# Patient Record
Sex: Female | Born: 1965 | Race: White | Hispanic: No | Smoking: Never smoker
Health system: Southern US, Community
[De-identification: ages and names within clinical notes are randomized; demographics above are authoritative.]

## PROBLEM LIST (undated history)

## (undated) DIAGNOSIS — F32A Depression, unspecified: Secondary | ICD-10-CM

## (undated) DIAGNOSIS — F332 Major depressive disorder, recurrent severe without psychotic features: Secondary | ICD-10-CM

## (undated) DIAGNOSIS — K760 Fatty (change of) liver, not elsewhere classified: Secondary | ICD-10-CM

## (undated) DIAGNOSIS — E063 Autoimmune thyroiditis: Secondary | ICD-10-CM

## (undated) DIAGNOSIS — F419 Anxiety disorder, unspecified: Secondary | ICD-10-CM

## (undated) DIAGNOSIS — E1121 Type 2 diabetes mellitus with diabetic nephropathy: Secondary | ICD-10-CM

## (undated) DIAGNOSIS — E049 Nontoxic goiter, unspecified: Secondary | ICD-10-CM

## (undated) DIAGNOSIS — F329 Major depressive disorder, single episode, unspecified: Secondary | ICD-10-CM

## (undated) DIAGNOSIS — J189 Pneumonia, unspecified organism: Secondary | ICD-10-CM

## (undated) DIAGNOSIS — I10 Essential (primary) hypertension: Secondary | ICD-10-CM

## (undated) DIAGNOSIS — Z87442 Personal history of urinary calculi: Secondary | ICD-10-CM

## (undated) DIAGNOSIS — R590 Localized enlarged lymph nodes: Secondary | ICD-10-CM

## (undated) DIAGNOSIS — E119 Type 2 diabetes mellitus without complications: Secondary | ICD-10-CM

## (undated) HISTORY — DX: Essential (primary) hypertension: I10

## (undated) HISTORY — DX: Major depressive disorder, recurrent severe without psychotic features: F33.2

## (undated) HISTORY — DX: Type 2 diabetes mellitus without complications: E11.9

## (undated) HISTORY — DX: Anxiety disorder, unspecified: F41.9

## (undated) HISTORY — PX: APPENDECTOMY: SHX54

## (undated) HISTORY — DX: Localized enlarged lymph nodes: R59.0

## (undated) HISTORY — DX: Autoimmune thyroiditis: E06.3

## (undated) HISTORY — DX: Type 2 diabetes mellitus with diabetic nephropathy: E11.21

## (undated) HISTORY — DX: Depression, unspecified: F32.A

## (undated) HISTORY — PX: OTHER SURGICAL HISTORY: SHX169

## (undated) HISTORY — PX: ABDOMINAL HYSTERECTOMY: SHX81

---
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
## 2013-05-18 ENCOUNTER — Ambulatory Visit (HOSPITAL_BASED_OUTPATIENT_CLINIC_OR_DEPARTMENT_OTHER): Payer: BC Managed Care – PPO | Attending: Allergy and Immunology

## 2013-05-18 VITALS — Ht 70.0 in | Wt 315.0 lb

## 2013-05-18 DIAGNOSIS — G4733 Obstructive sleep apnea (adult) (pediatric): Secondary | ICD-10-CM | POA: Insufficient documentation

## 2013-05-24 DIAGNOSIS — G4761 Periodic limb movement disorder: Secondary | ICD-10-CM

## 2013-05-24 DIAGNOSIS — R0989 Other specified symptoms and signs involving the circulatory and respiratory systems: Secondary | ICD-10-CM

## 2013-05-24 DIAGNOSIS — G4733 Obstructive sleep apnea (adult) (pediatric): Secondary | ICD-10-CM

## 2013-05-24 DIAGNOSIS — R0609 Other forms of dyspnea: Secondary | ICD-10-CM

## 2013-05-24 NOTE — Procedures (Signed)
NAME:  Holly [name], Holly [name]                  ACCOUNT NO.:  [id number]  MEDICAL RECORD NO.:  [id number]          PATIENT TYPE:  OUT  LOCATION:  SLEEP CENTER                 FACILITY:  [facility]  PHYSICIAN:  Renley Gutman D. Maple Hudson, MD, FCCP, FACPDATE OF BIRTH:  02-09-67  DATE OF STUDY:  05/18/2013                           NOCTURNAL POLYSOMNOGRAM  REFERRING PHYSICIAN:  Jessica Priest, M.D.  INDICATION FOR STUDY:  Hypersomnia with sleep apnea.  EPWORTH SLEEPINESS SCORE:  15/24.  BMI 45, weight 315 pounds, height 70.5 inches, neck 16 inches.  MEDICATIONS:  Home medications are charted for review.  SLEEP ARCHITECTURE:  Total sleep time 240.5 minutes with sleep efficiency 63.6%.  Stage I was 36.2%, stage II 58.4%, stage III 5.4%, REM absent.  Sleep latency 45.5 minutes, awake after sleep onset 94 minutes.  Arousal index 44.9.  Bedtime medication:  None.  Sleep was marked by frequent spontaneous awakenings throughout the night.  RESPIRATORY DATA:  Apnea-hypopnea index (AHI) 12.5 per hour.  A total of 50 events were scored, all are hypopneas and more frequently while non- supine.  There were not enough early events to meet split CPAP criteria.  OXYGEN DATA:  Moderate snoring with oxygen desaturation to a nadir of 88% and mean oxygen saturation through the study of 92.8% on room air.  CARDIAC DATA:  Sinus rhythm with occasional PVCs.  MOVEMENT-PARASOMNIA:  Periodic limb movement syndrome.  A total of 253 limb jerks were counted, of which 71 were associated with arousal or awakening for periodic limb movement with arousal index of 17.7 per hour.  IMPRESSIONS-RECOMMENDATIONS: 1. Markedly fragmented sleep with frequent awakenings throughout the     night.  No bedtime medication. 2. Mild obstructive sleep apnea/hypopnea syndrome, hypopneas, mostly     associated with nonsupine sleep position.  AHI 12.5 per hour.     Moderate snoring with oxygen desaturation to a nadir of 88% and     mean oxygen  saturation through the study of 92.8% on room air. 3. She had insufficient numbers of respiratory events in the first     hours of the study to meet protocol criteria for split CPAP     titration.  If appropriate, she can return for dedicated CPAP     titration study. 4. Periodic limb movement with arousal syndrome.  A total of 253 limb     jerks were counted, of which 71 were associated with arousals or     awakening for an index of 17.7 movement-related sleep     disturbance events per hour.  Some of these may have been in response to        respiratory stimulation.  If the pattern persists then a trial of specific     therapy such as ReQuip or Mirapex might be considered if     appropriate.     Holly Lippens D. Maple Hudson, MD, [facility], FACP Diplomate, American Board of Sleep Medicine    CDY/MEDQ  D:  05/24/2013 10:11:07  T:  05/24/2013 10:24:47  Job:  [id number]

## 2013-07-28 ENCOUNTER — Institutional Professional Consult (permissible substitution): Payer: BC Managed Care – PPO | Admitting: Internal Medicine

## 2017-06-15 LAB — HM COLONOSCOPY

## 2017-07-09 DIAGNOSIS — F339 Major depressive disorder, recurrent, unspecified: Secondary | ICD-10-CM | POA: Insufficient documentation

## 2017-07-09 DIAGNOSIS — F331 Major depressive disorder, recurrent, moderate: Secondary | ICD-10-CM

## 2017-07-09 HISTORY — DX: Major depressive disorder, recurrent, moderate: F33.1

## 2019-03-01 DIAGNOSIS — D869 Sarcoidosis, unspecified: Secondary | ICD-10-CM

## 2019-03-01 DIAGNOSIS — D860 Sarcoidosis of lung: Secondary | ICD-10-CM | POA: Insufficient documentation

## 2019-03-01 HISTORY — DX: Sarcoidosis, unspecified: D86.9

## 2019-03-20 DIAGNOSIS — E1169 Type 2 diabetes mellitus with other specified complication: Secondary | ICD-10-CM | POA: Diagnosis not present

## 2019-03-20 DIAGNOSIS — R590 Localized enlarged lymph nodes: Secondary | ICD-10-CM

## 2019-03-20 DIAGNOSIS — R079 Chest pain, unspecified: Secondary | ICD-10-CM

## 2019-03-20 DIAGNOSIS — E039 Hypothyroidism, unspecified: Secondary | ICD-10-CM | POA: Diagnosis not present

## 2019-03-20 DIAGNOSIS — R9389 Abnormal findings on diagnostic imaging of other specified body structures: Secondary | ICD-10-CM

## 2019-03-20 DIAGNOSIS — F329 Major depressive disorder, single episode, unspecified: Secondary | ICD-10-CM | POA: Diagnosis not present

## 2019-03-20 DIAGNOSIS — R0600 Dyspnea, unspecified: Secondary | ICD-10-CM

## 2019-03-20 DIAGNOSIS — I10 Essential (primary) hypertension: Secondary | ICD-10-CM | POA: Diagnosis not present

## 2019-03-20 HISTORY — DX: Localized enlarged lymph nodes: R59.0

## 2019-03-21 DIAGNOSIS — E039 Hypothyroidism, unspecified: Secondary | ICD-10-CM | POA: Diagnosis not present

## 2019-03-21 DIAGNOSIS — I10 Essential (primary) hypertension: Secondary | ICD-10-CM | POA: Diagnosis not present

## 2019-03-21 DIAGNOSIS — F329 Major depressive disorder, single episode, unspecified: Secondary | ICD-10-CM | POA: Diagnosis not present

## 2019-03-21 DIAGNOSIS — E1169 Type 2 diabetes mellitus with other specified complication: Secondary | ICD-10-CM | POA: Diagnosis not present

## 2019-03-21 DIAGNOSIS — R079 Chest pain, unspecified: Secondary | ICD-10-CM | POA: Diagnosis not present

## 2019-03-25 ENCOUNTER — Telehealth: Payer: Self-pay | Admitting: Hematology & Oncology

## 2019-03-25 NOTE — Telephone Encounter (Signed)
Returned call to patient.  She is requesting a NP appt at [facility].  I explained that she would need to be referred and her PCP would need to send a referral and at that time appt can be scheduled.

## 2019-03-28 DIAGNOSIS — R590 Localized enlarged lymph nodes: Secondary | ICD-10-CM | POA: Diagnosis not present

## 2019-04-04 DIAGNOSIS — R590 Localized enlarged lymph nodes: Secondary | ICD-10-CM | POA: Diagnosis not present

## 2019-04-14 ENCOUNTER — Encounter: Payer: Self-pay | Admitting: *Deleted

## 2019-04-14 ENCOUNTER — Other Ambulatory Visit: Payer: Self-pay

## 2019-04-14 DIAGNOSIS — F32A Depression, unspecified: Secondary | ICD-10-CM | POA: Insufficient documentation

## 2019-04-14 DIAGNOSIS — F332 Major depressive disorder, recurrent severe without psychotic features: Secondary | ICD-10-CM | POA: Insufficient documentation

## 2019-04-14 DIAGNOSIS — R590 Localized enlarged lymph nodes: Secondary | ICD-10-CM | POA: Insufficient documentation

## 2019-04-14 DIAGNOSIS — I10 Essential (primary) hypertension: Secondary | ICD-10-CM | POA: Insufficient documentation

## 2019-04-14 DIAGNOSIS — F329 Major depressive disorder, single episode, unspecified: Secondary | ICD-10-CM | POA: Insufficient documentation

## 2019-04-14 DIAGNOSIS — E1121 Type 2 diabetes mellitus with diabetic nephropathy: Secondary | ICD-10-CM | POA: Insufficient documentation

## 2019-04-14 DIAGNOSIS — E063 Autoimmune thyroiditis: Secondary | ICD-10-CM | POA: Insufficient documentation

## 2019-04-14 DIAGNOSIS — E119 Type 2 diabetes mellitus without complications: Secondary | ICD-10-CM

## 2019-04-14 DIAGNOSIS — F419 Anxiety disorder, unspecified: Secondary | ICD-10-CM | POA: Insufficient documentation

## 2019-04-14 NOTE — Progress Notes (Signed)
[address]       [place] [address]             [phone]                    Holly [name] [place] Medical Record #[id number] Date of Birth: 02-18-1966  Referring: Marice Potter, MD Primary Care: Patient, No Pcp Per Primary Cardiologist: No primary care provider on file.  Chief Complaint:    Chief Complaint  Patient presents with   Lung Lesion    Surgical eval, CTA Chest 03/20/19, PET Scan 04/01/19- in PAC's    History of Present Illness:    Holly [name] 53 y.o. female is seen in the office for evaluation of mediastinal adenopathy.  Patient notes in the winter 2020 having episodes of respiratory infection, with shortness of breath.  Recently a CT scan of the chest was done following with a PET scan that suggested mediastinal adenopathy which is hypermetabolic,.  Patient has noted mild low-grade episodic fever, weight loss of approximately 20 pounds over 6 months. Patient is a lifelong non-smoker.   Current Activity/ Functional Status:  Patient is independent with mobility/ambulation, transfers, ADL's, IADL's.   Zubrod Score: At the time of surgery this patients most appropriate activity status/level should be described as: [x]     0    Normal activity, no symptoms []     1    Restricted in physical strenuous activity but ambulatory, able to do out light work []     2    Ambulatory and capable of self care, unable to do work activities, up and about               >50 % of waking hours                              []     3    Only limited self care, in bed greater than 50% of waking hours []     4    Completely disabled, no self care, confined to bed or chair []     5    Moribund   Past Medical History:  Diagnosis Date   Anxiety    Depression    Diabetes mellitus without complication ([place])    Fatty liver    Goiter    Hashimoto's thyroiditis    Hilar adenopathy 03/20/19  04/11/19   CTA CHEST and PET per DR. Lauretta Chester LEWIS   History of kidney  stones    Hypertension    Mediastinal adenopathy    PER CTA CHEST PET SCAN per DR. Beryle Lathe LEWIS   Pneumonia    Supraclavicular adenopathy 03/20/2019   PER CTA CHEST and PET per DR. Warnell Forester    Past Surgical History:  Procedure Laterality Date   ABDOMINAL HYSTERECTOMY     COMPLETE   ADHESIOLYSIS     APPENDECTOMY     CESAREAN SECTION      Family History  Problem Relation Age of Onset   Anuerysm Mother        BRAIN   Cancer Mother        BREAST   COPD Father    Hyperlipidemia Father    Congestive Heart Failure Father    Bipolar disorder Sister    Diabetes Sister    Fibromyalgia Sister    Cancer Paternal Uncle        LUNG   Cancer  Maternal Grandmother 56       BREAST   Cancer Paternal Grandfather        LUNG     Social History   Tobacco Use  Smoking Status Never Smoker  Smokeless Tobacco Never Used    Social History   Substance and Sexual Activity  Alcohol Use Yes   Comment: VERY RARE OCCASION     Allergies  Allergen Reactions   Avelox [Moxifloxacin Hcl In Nacl]    Cymbalta [Duloxetine Hcl]    Oxycodone-Acetaminophen     Current Outpatient Medications  Medication Sig Dispense Refill   atenolol-chlorthalidone (TENORETIC) 50-25 MG tablet Take 1 tablet by mouth daily.      levothyroxine (SYNTHROID) 25 MCG tablet Take 25 mcg by mouth daily before breakfast.      metFORMIN (GLUCOPHAGE-XR) 500 MG 24 hr tablet Take 1,000 mg by mouth 2 (two) times a day.      traZODone (DESYREL) 100 MG tablet Take 100 mg by mouth at bedtime as needed for sleep.      venlafaxine XR (EFFEXOR-XR) 75 MG 24 hr capsule Take 300 mg by mouth daily. Takes 4 ( ) capsules daily=300mg      atorvastatin (LIPITOR) 40 MG tablet Take 40 mg by mouth daily at 6 PM.      ibuprofen (ADVIL) 200 MG tablet Take 400 mg by mouth every 6 (six) hours as needed for headache or moderate pain.     No current facility-administered medications for this visit.      Pertinent items are noted in HPI.   Review of Systems:     Cardiac Review of Systems: [Y] = yes  or   [ N ] = no   Chest Pain [ n   ]  Resting SOB [  n ] Exertional SOB  Cove.Etienne  ]  Orthopnea [ n ]   Pedal Edema Milo.Brash   ]    Palpitations [ n ] Syncope  [ n ]   Presyncope [ n  ]   General Review of Systems: [Y] = yes [  ]=no Constitional: recent weight change [  ];  Wt loss over the last 3 months [   ] anorexia [  ]; fatigue [  ]; nausea [  ]; night sweats [  ]; fever [  ]; or chills [  ];           Eye : blurred vision [  ]; diplopia [   ]; vision changes [  ];  Amaurosis fugax[  ]; Resp: cough y[  ];  wheezing[ y ];  hemoptysis[  ]; shortness of breath[ y ]; paroxysmal nocturnal dyspnea[  ]; dyspnea on exertion[ y ]; or orthopnea[  ];  GI:  gallstones[  ], vomiting[  ];  dysphagia[  ]; melena[  ];  hematochezia [  ]; heartburn[  ];   Hx of  Colonoscopy[  ]; GU: kidney stones [  ]; hematuria[  ];   dysuria [  ];  nocturia[  ];  history of     obstruction [  ]; urinary frequency [  ]             Skin: rash, swelling[  ];, hair loss[  ];  peripheral edema[  ];  or itching[  ]; Musculosketetal: myalgias[  ];  joint swelling[  ];  joint erythema[  ];  joint pain[  ];  back pain[  ];  Heme/Lymph: bruising[  ];  bleeding[  ];  anemia[  ];  Neuro: TIA[  ];  headaches[  ];  stroke[  ];  vertigo[  ];  seizures[  ];   paresthesias[  ];  difficulty walking[  ];  Psych:depression[y  ]; anxiety[ y ];  Endocrine: diabetes[y  ];  thyroid dysfunction[  ];  Immunizations: Flu up to date [  ]; Pneumococcal up to date [  ];  Other:     PHYSICAL EXAMINATION: BP 106/70 (BP Location: Right Arm, Patient Position: Sitting, Cuff Size: Large)    Pulse 60    Temp (!) 96.6 F (35.9 C)    Resp 18    Ht 5\' 10"  (1.778 m)    Wt (!) 300 lb 6.4 oz (136.3 kg)    SpO2 95% Comment: RA   BMI 43.10 kg/m  General appearance: alert, cooperative, appears stated age and no distress Head: Normocephalic, without obvious  abnormality, atraumatic Neck: no adenopathy, no carotid bruit, no JVD, supple, symmetrical, trachea midline and thyroid not enlarged, symmetric, no tenderness/mass/nodules Lymph nodes: Cervical, supraclavicular, and axillary nodes normal. Resp: clear to auscultation bilaterally Back: symmetric, no curvature. ROM normal. No CVA tenderness. Cardio: regular rate and rhythm, S1, S2 normal, no murmur, click, rub or gallop GI: soft, non-tender; bowel sounds normal; no masses,  no organomegaly Extremities: extremities normal, atraumatic, no cyanosis or edema Neurologic: Grossly normal Patient has known bilateral thyroid goiter but this not easily palpable on physical exam, suggestion of scan of right supraclavicular adenopathy this is also not palpable. Diagnostic Studies & Laboratory data:     Recent Radiology Findings:   CLINICAL DATA: Shortness of breath  EXAM: CT ANGIOGRAPHY CHEST WITH CONTRAST  TECHNIQUE: Multidetector CT imaging of the chest was performed using the standard protocol during bolus administration of intravenous contrast. Multiplanar CT image reconstructions and MIPs were obtained to evaluate the vascular anatomy.  CONTRAST: 80 mL Isovue 370 nonionic  COMPARISON: Chest CT angiogram November 13, 2016; chest radiograph Mar 20, 2019  FINDINGS: Cardiovascular: There is no demonstrable pulmonary embolus. There is no thoracic aortic aneurysm or dissection. The visualized great vessels appear unremarkable. There is no pericardial effusion or pericardial thickening.  Mediastinum/Nodes: There is thyroid enlargement consistent with multinodular goiter.  There is extensive multifocal adenopathy. There are multiple lymph nodes in the lateral aspect of the anterior mediastinum on the left, largest measuring 2.1 x 1.6 cm. There are multiple lymph nodes to the left of the aortic arch, largest measuring 2.2 x 1.7 cm. There is aortopulmonary window adenopathy with the largest  individual lymph node in this area measuring 2.0 x 1.8 cm. There are multiple right paratracheal lymph nodes, largest measuring 1.9 x 1.5 cm. There are multiple lymph nodes anterior to the trachea and carina, with the largest individual node in this area measuring 2.3 x 1.8 cm. There is subcarinal adenopathy with the largest lymph node in the subcarinal region measuring 2.8 x 2.4 cm. There are left hilar lymph nodes with the largest lymph node in the left hilar region measuring 1.6 x 1.6 cm. The largest lymph node in the right hilar region measures 1.4 x 1.1 cm.  No esophageal lesions are evident.  Lungs/Pleura: There is right lower lobe atelectatic change. There is no evident edema or consolidation. There is no evident pleural effusion or pleural thickening.  Upper Abdomen: There is hepatic steatosis. 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 Spleen is prominent measuring 14.5 x 13.3 x 7.6 cm  with a measured splenic volume of 738 cubic cm. No focal splenic lesions evident. Visualized upper abdominal structures otherwise appear unremarkable.  Musculoskeletal: There are no blastic or lytic bone lesions. No chest wall lesions are evident.  Review of the MIP images confirms the above findings.  IMPRESSION: 1. No evident pulmonary embolus. No demonstrable thoracic aortic aneurysm or dissection.  2. Multifocal adenopathy, suspicious for neoplastic etiology.  3. Enlarged spleen. This finding coupled with the multifocal adenopathy raises concern for lymphoma. In this regard, nuclear medicine PET study may be most helpful for assessment for areas of abnormal metabolic activity.  4. Mild atelectatic change. No edema or consolidation. No parenchymal mass lesions.  5. Hepatic steatosis. 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  6. Findings indicative of multinodular  goiter.   Electronically Signed By: Bretta BangWilliam Woodruff III M.D. On: 03/20/2019 14:27  Principal Interpreter Name: Bretta BangWilliam Woodruff III Provider ID: [id number]   I have independently reviewed the above radiology studies  and reviewed the findings with the patient.   Final Report  CLINICAL DATA: Worsening shortness of breath x3 months, midsternal chest pain  EXAM: MYOCARDIAL IMAGING WITH SPECT (REST AND PHARMACOLOGIC-STRESS)  GATED LEFT VENTRICULAR WALL MOTION STUDY  LEFT VENTRICULAR EJECTION FRACTION  TECHNIQUE: Standard myocardial SPECT imaging was performed after resting intravenous injection of 8 mCi Tc-3679m sestamibi. Subsequently, intravenous infusion of Lexiscan was performed under the supervision of the Cardiology staff. At peak effect of the drug, 25 mCi Tc-5879m sestamibi was injected intravenously and standard myocardial SPECT imaging was performed. Quantitative gated imaging was also performed to evaluate left ventricular wall motion, and estimate left ventricular ejection fraction.  COMPARISON: None.  FINDINGS: Perfusion: No decreased activity in the left ventricle on stress imaging to suggest reversible ischemia or infarction.  Wall Motion: Normal left ventricular wall motion. No left ventricular dilation.  Left Ventricular Ejection Fraction: 83 %  End diastolic volume 62 ml  End systolic volume 11 ml  IMPRESSION: 1. No reversible ischemia or infarction.  2. Normal left ventricular wall motion.  3. Left ventricular ejection fraction 83%  4. Non invasive risk stratification*: Low  *2012 Appropriate Use Criteria for Coronary Revascularization Focused Update: J Am Coll Cardiol. 2012;59(9):857-881. http://content.dementiazones.comonlinejacc.org/article.aspx?articleid=1201161   Electronically Signed By: Corlis Leak Hassell M.D. On: 03/21/2019 12:42  Principal Interpreter Name: Corlis LeakD Hassell Provider ID: [id number]  Final Report  CLINICAL DATA: Initial treatment strategy for  lymphadenopathy.  EXAM: NUCLEAR MEDICINE PET SKULL BASE TO THIGH  TECHNIQUE: Fourteen mCi F-18 FDG was injected intravenously. Full-ring PET imaging was performed from the skull base to thigh after the radiotracer. CT data was obtained and used for attenuation correction and anatomic localization.  Fasting blood glucose: 150 mg/dl  COMPARISON: None.  FINDINGS: Mediastinal blood pool activity: SUV max 3.3  Liver activity: SUV max 4.6  NECK: Thyroid gland is enlarged and hypermetabolic with SUV max equal 7.1. The activity is diffuse throughout the enlarged gland.  There are small hypermetabolic supraclavicular nodes. For example RIGHT supraclavicular node with SUV max equal 5.5.  Incidental CT findings: none  CHEST: Numerous hypermetabolic enlarged mediastinal lymph nodes. Nodes involve the bilateral hilum. Paratracheal nodes, prevascular nodes, and subcarinal nodes. For example subcarinal node measures 2 cm short axis with SUV max equal 19. Prevascular node measures 15 mm with SUV max equal 17. RIGHT lower paratracheal node is enlarged with SUV max equal 22.2.  Incidental CT findings: none  ABDOMEN/PELVIS: In the upper abdomen retrocrural node/paraesophageal node measures 10 mm short axis (image 167)  with SUV max equal 13.4.  Lymph node adjacent to the LEFT adrenal gland measures 12 mm SUV max equal 29.  No additional abdominal adenopathy. No pelvic hypermetabolic lymph nodes.  Spleen is normal size and normal metabolic activity.  Normal liver, pancreas. Diffuse activity in the bowel is favored physiologic.  Incidental CT findings: Prostate normal  SKELETON: No focal hypermetabolic activity to suggest skeletal metastasis.  Incidental CT findings: none  IMPRESSION: 1. Intensely hypermetabolic and enlarged supraclavicular, mediastinal, and hilar lymphadenopathy consistent with high-grade lymphoma. 2. Hypermetabolic metastatic nodes in the upper abdomen. 3.  No pelvic nodal metastasis. 4. Normal spleen and bone marrow. 5. Thyroid gland is diffusely enlarged and hypermetabolic. Differential include lymphoma involvement of the thyroid gland versus thyroiditis. The activity is significantly less than the mediastinal lymph nodes therefore favor thyroiditis. Additionally, there is thyromegaly on CT 11/13/2016.   Electronically Signed By: Genevive BiStewart Edmunds M.D. On: 04/01/2019 12:42  Principal Interpreter Name: Genevive BiStewart Edmunds Provider ID: [id number]       Recent Lab Findings: Lab Results  Component Value Date   WBC 7.7 04/16/2019   HGB 14.5 04/16/2019   HCT 43.3 04/16/2019   PLT 312 04/16/2019   GLUCOSE 146 (H) 04/16/2019   ALT 30 04/16/2019   AST 25 04/16/2019   NA 137 04/16/2019   K 3.5 04/16/2019   CL 101 04/16/2019   CREATININE 0.78 04/16/2019   BUN 11 04/16/2019   CO2 25 04/16/2019   INR 1.0 04/16/2019   HGBA1C 6.9 (H) 04/16/2019      Assessment / Plan:    Multifocal mediastinal adenopathy hypermetabolic on PET scan suggestive of possible lymphoma, or potentially sarcoid.  I discussed with patient and reviewed the films with her and suggested we proceed with obtaining a tissue diagnosis.  We will proceed with bronchoscopy ebus transbronchial biopsy and likely mediastinoscopy to obtain a definitive tissue diagnosis.  Risks and options of the procedure and expectations were discussed with the patient in detail and she is agreeable.  Anxiety    Depression    Diabetes mellitus without complication ([facility])    Fatty liver    Goiter    Hashimoto's thyroiditis    History of kidney stones    Hypertension     I  spent 45 minutes with  the patient face to face and greater then 50% of the time was spent in counseling and coordination of care.    Delight OvensEdward B Cardin Nitschke MD      [address] [place] [id number] Office [phone]   Beeper [phone]  04/16/2019 9:37 PM

## 2019-04-15 ENCOUNTER — Institutional Professional Consult (permissible substitution): Payer: BC Managed Care – PPO | Admitting: Cardiothoracic Surgery

## 2019-04-15 ENCOUNTER — Encounter: Payer: Self-pay | Admitting: Cardiothoracic Surgery

## 2019-04-15 ENCOUNTER — Other Ambulatory Visit: Payer: Self-pay | Admitting: *Deleted

## 2019-04-15 VITALS — BP 106/70 | HR 60 | Temp 96.6°F | Resp 18 | Ht 70.0 in | Wt 300.4 lb

## 2019-04-15 DIAGNOSIS — R590 Localized enlarged lymph nodes: Secondary | ICD-10-CM | POA: Diagnosis not present

## 2019-04-16 ENCOUNTER — Encounter (HOSPITAL_COMMUNITY): Payer: Self-pay

## 2019-04-16 ENCOUNTER — Encounter (HOSPITAL_COMMUNITY)
Admission: RE | Admit: 2019-04-16 | Discharge: 2019-04-16 | Disposition: A | Discharge status: Home / Self Care | Payer: BC Managed Care – PPO | Source: Ambulatory Visit

## 2019-04-16 ENCOUNTER — Other Ambulatory Visit (HOSPITAL_COMMUNITY)
Admission: RE | Admit: 2019-04-16 | Discharge: 2019-04-16 | Disposition: A | Discharge status: Home / Self Care | Payer: BC Managed Care – PPO | Source: Ambulatory Visit | Attending: Cardiothoracic Surgery | Admitting: Cardiothoracic Surgery

## 2019-04-16 ENCOUNTER — Ambulatory Visit (HOSPITAL_COMMUNITY)
Admission: RE | Admit: 2019-04-16 | Discharge: 2019-04-16 | Disposition: A | Discharge status: Home / Self Care | Payer: BC Managed Care – PPO | Source: Ambulatory Visit | Attending: Cardiothoracic Surgery | Admitting: Cardiothoracic Surgery

## 2019-04-16 ENCOUNTER — Other Ambulatory Visit: Payer: Self-pay

## 2019-04-16 DIAGNOSIS — Z01818 Encounter for other preprocedural examination: Secondary | ICD-10-CM

## 2019-04-16 DIAGNOSIS — Z1159 Encounter for screening for other viral diseases: Secondary | ICD-10-CM | POA: Insufficient documentation

## 2019-04-16 DIAGNOSIS — R590 Localized enlarged lymph nodes: Secondary | ICD-10-CM | POA: Diagnosis not present

## 2019-04-16 HISTORY — DX: Nontoxic goiter, unspecified: E04.9

## 2019-04-16 HISTORY — DX: Fatty (change of) liver, not elsewhere classified: K76.0

## 2019-04-16 HISTORY — DX: Personal history of urinary calculi: Z87.442

## 2019-04-16 HISTORY — DX: Pneumonia, unspecified organism: J18.9

## 2019-04-16 LAB — PROTIME-INR
INR: 1 (ref 0.8–1.2)
Prothrombin Time: 13.3 seconds (ref 11.4–15.2)

## 2019-04-16 LAB — COMPREHENSIVE METABOLIC PANEL
ALT: 30 U/L (ref 0–44)
AST: 25 U/L (ref 15–41)
Albumin: 3.6 g/dL (ref 3.5–5.0)
Alkaline Phosphatase: 98 U/L (ref 38–126)
Anion gap: 11 (ref 5–15)
BUN: 11 mg/dL (ref 6–20)
CO2: 25 mmol/L (ref 22–32)
Calcium: 9.3 mg/dL (ref 8.9–10.3)
Chloride: 101 mmol/L (ref 98–111)
Creatinine, Ser: 0.78 mg/dL (ref 0.44–1.00)
GFR calc Af Amer: >60 mL/min (ref >60)
GFR calc non Af Amer: >60 mL/min (ref >60)
Glucose, Bld: 146 mg/dL — ABNORMAL HIGH (ref 70–99)
Potassium: 3.5 mmol/L (ref 3.5–5.1)
Sodium: 137 mmol/L (ref 135–145)
Total Bilirubin: 0.6 mg/dL (ref 0.3–1.2)
Total Protein: 7.4 g/dL (ref 6.5–8.1)

## 2019-04-16 LAB — CBC
HCT: 43.3 % (ref 36.0–46.0)
Hemoglobin: 14.5 g/dL (ref 12.0–15.0)
MCH: 30.1 pg (ref 26.0–34.0)
MCHC: 33.5 g/dL (ref 30.0–36.0)
MCV: 90 fL (ref 80.0–100.0)
Platelets: 312 10*3/uL (ref 150–400)
RBC: 4.81 MIL/uL (ref 3.87–5.11)
RDW: 12.1 % (ref 11.5–15.5)
WBC: 7.7 10*3/uL (ref 4.0–10.5)
nRBC: 0 % (ref 0.0–0.2)

## 2019-04-16 LAB — TYPE AND SCREEN
ABO/RH(D): AB POS
Antibody Screen: NEGATIVE

## 2019-04-16 LAB — APTT: aPTT: 31 seconds (ref 24–36)

## 2019-04-16 LAB — HEMOGLOBIN A1C
Hgb A1c MFr Bld: 6.9 % — ABNORMAL HIGH (ref 4.8–5.6)
Mean Plasma Glucose: 151.33 mg/dL

## 2019-04-16 LAB — GLUCOSE, CAPILLARY: Glucose-Capillary: 205 mg/dL — ABNORMAL HIGH (ref 70–99)

## 2019-04-16 LAB — ABO/RH: ABO/RH(D): AB POS

## 2019-04-16 LAB — SARS CORONAVIRUS 2 (TAT 6-24 HRS): SARS Coronavirus 2: NEGATIVE

## 2019-04-16 NOTE — Progress Notes (Signed)
   04/16/19 1331  OBSTRUCTIVE SLEEP APNEA  Have you ever been diagnosed with sleep apnea through a sleep study? No  Do you snore loudly (loud enough to be heard through closed doors)?  1  Do you often feel tired, fatigued, or sleepy during the daytime (such as falling asleep during driving or talking to someone)? 0  Has anyone observed you stop breathing during your sleep? 0  Do you have, or are you being treated for high blood pressure? 1  BMI more than 35 kg/m2? 1  Age > 50 (1-yes) 1  Neck circumference greater than:Female 16 inches or larger, Female 17inches or larger? 1  Female Gender (Yes=1) 0  Obstructive Sleep Apnea Score 5  Score 5 or greater  Results sent to PCP

## 2019-04-16 NOTE — Pre-Procedure Instructions (Signed)
No Pharmacies Listed     Your procedure is scheduled on Friday, June 26th.  Report to [facility] Main Entrance "A" at 5:30 A.M., and check in at the Admitting office.  Call this number if you have problems the morning of surgery:  [phone]  Call [phone] if you have any questions prior to your surgery date Monday-Friday 8am-4pm    Remember:  Do not eat or drink after midnight.    Take these medicines the morning of surgery with A SIP OF WATER  atenolol-chlorthalidone (TENORETIC)  levothyroxine (SYNTHROID) venlafaxine XR (EFFEXOR-XR)  7 days prior to surgery STOP taking any Aspirin (unless otherwise instructed by your surgeon), Aleve, Naproxen, Ibuprofen, Motrin, Advil, Goody's, BC's, all herbal medications, fish oil, and all vitamins.   WHAT DO I DO ABOUT MY DIABETES MEDICATION?   Marland Kitchen Do not take oral diabetes medicines (pills) the morning of surgery-metFORMIN (GLUCOPHAGE-XR).     How to Manage Your Diabetes Before and After Surgery  Why is it important to control my blood sugar before and after surgery? . Improving blood sugar levels before and after surgery helps healing and can limit problems. . A way of improving blood sugar control is eating a healthy diet by: o  Eating less sugar and carbohydrates o  Increasing activity/exercise o  Talking with your doctor about reaching your blood sugar goals . High blood sugars (greater than 180 mg/dL) can raise your risk of infections and slow your recovery, so you will need to focus on controlling your diabetes during the weeks before surgery. . Make sure that the doctor who takes care of your diabetes knows about your planned surgery including the date and location.  How do I manage my blood sugar before surgery? . Check your blood sugar at least 4 times a day, starting 2 days before surgery, to make sure that the level is not too high or low. o Check your blood sugar the morning of your surgery when you wake up and every  2 hours until you get to the Short Stay unit. . If your blood sugar is less than 70 mg/dL, you will need to treat for low blood sugar: o Do not take insulin. o Treat a low blood sugar (less than 70 mg/dL) with  cup of clear juice (cranberry or apple), 4 glucose tablets, OR glucose gel. o Recheck blood sugar in 15 minutes after treatment (to make sure it is greater than 70 mg/dL). If your blood sugar is not greater than 70 mg/dL on recheck, call [phone] for further instructions. . Report your blood sugar to the short stay nurse when you get to Short Stay.  . If you are admitted to the hospital after surgery: o Your blood sugar will be checked by the staff and you will probably be given insulin after surgery (instead of oral diabetes medicines) to make sure you have good blood sugar levels. o The goal for blood sugar control after surgery is 80-180 mg/dL.    The Morning of Surgery  Do not wear jewelry, make-up or nail polish.  Do not wear lotions, powders, or perfumes/colognes, or deodorant  Do not shave 48 hours prior to surgery.   Do not bring valuables to the hospital.  [facility] is not responsible for any belongings or valuables.  If you are a smoker, DO NOT Smoke 24 hours prior to surgery IF you wear a CPAP at night please bring your mask, tubing, and machine the morning of surgery   Remember that  you must have someone to transport you home after your surgery, and remain with you for 24 hours if you are discharged the same day.   Contacts, glasses, hearing aids, dentures or bridgework may not be worn into surgery.    Leave your suitcase in the car.  After surgery it may be brought to your room.  For patients admitted to the hospital, discharge time will be determined by your treatment team.  Patients discharged the day of surgery will not be allowed to drive home.    Special instructions:   - Preparing For Surgery  Before surgery, you can play an important  role. Because skin is not sterile, your skin needs to be as free of germs as possible. You can reduce the number of germs on your skin by washing with CHG (chlorahexidine gluconate) Soap before surgery.  CHG is an antiseptic cleaner which kills germs and bonds with the skin to continue killing germs even after washing.    Oral Hygiene is also important to reduce your risk of infection.  Remember - BRUSH YOUR TEETH THE MORNING OF SURGERY WITH YOUR REGULAR TOOTHPASTE  Please do not use if you have an allergy to CHG or antibacterial soaps. If your skin becomes reddened/irritated stop using the CHG.  Do not shave (including legs and underarms) for at least 48 hours prior to first CHG shower. It is OK to shave your face.  Please follow these instructions carefully.   1. Shower the NIGHT BEFORE SURGERY and the MORNING OF SURGERY with CHG Soap.   2. If you chose to wash your hair, wash your hair first as usual with your normal shampoo.  3. After you shampoo, rinse your hair and body thoroughly to remove the shampoo.  4. Use CHG as you would any other liquid soap. You can apply CHG directly to the skin and wash gently with a scrungie or a clean washcloth.   5. Apply the CHG Soap to your body ONLY FROM THE NECK DOWN.  Do not use on open wounds or open sores. Avoid contact with your eyes, ears, mouth and genitals (private parts). Wash Face and genitals (private parts)  with your normal soap.   6. Wash thoroughly, paying special attention to the area where your surgery will be performed.  7. Thoroughly rinse your body with warm water from the neck down.  8. DO NOT shower/wash with your normal soap after using and rinsing off the CHG Soap.  9. Pat yourself dry with a CLEAN TOWEL.  10. Wear CLEAN PAJAMAS to bed the night before surgery, wear comfortable clothes the morning of surgery  11. Place CLEAN SHEETS on your bed the night of your first shower and DO NOT SLEEP WITH PETS.    Day of  Surgery:  Do not apply any deodorants/lotions.  Please wear clean clothes to the hospital/surgery center.   Remember to brush your teeth WITH YOUR REGULAR TOOTHPASTE.   Please read over the following fact sheets that you were given.

## 2019-04-16 NOTE — Progress Notes (Signed)
PCP -Theodosia Blender  Cardiologist - denies  Chest x-ray - 04/16/19 EKG - 04/16/19 Stress Test - 5/20 at [facility]; requested records ECHO - denies Cardiac Cath -denies   Sleep Study - 2014; borderline study did not require a CPAP. Stop bang positive. No PCP in system to fax stop bang to.  CPAP - denies  Fasting Blood Sugar - 130-180 Checks Blood Sugar 1 time a day  Blood Thinner Instructions:N/A Aspirin Instructions:N/A  Anesthesia review: Yes; requested records  Patient denies shortness of breath, fever, cough and chest pain at PAT appointment   Patient verbalized understanding of instructions that were given to them at the PAT appointment. Patient was also instructed that they will need to review over the PAT instructions again at home before surgery.  Pt reported for COVID testing prior to PAT appointment. Results pending. Aware of quarantine rules.   Coronavirus Screening  Have you experienced the following symptoms:  Cough yes/no: No Fever (>100.58F)  yes/no: No Runny nose yes/no: No Sore throat yes/no: No Difficulty breathing/shortness of breath  yes/no: No  Have you or a family member traveled in the last 14 days and where? yes/no: No   If the patient indicates "YES" to the above questions, their PAT will be rescheduled to limit the exposure to others and, the surgeon will be notified. THE PATIENT WILL NEED TO BE ASYMPTOMATIC FOR 14 DAYS.   If the patient is not experiencing any of these symptoms, the PAT nurse will instruct them to NOT bring anyone with them to their appointment since they may have these symptoms or traveled as well.   Please remind your patients and families that hospital visitation restrictions are in effect and the importance of the restrictions.

## 2019-04-17 MED ORDER — DEXTROSE 5 % IV SOLN
3.0000 g | INTRAVENOUS | Status: AC
  Administered 2019-04-18: 3 g via INTRAVENOUS
  Filled 2019-04-17: qty 3

## 2019-04-17 NOTE — Anesthesia Preprocedure Evaluation (Addendum)
Anesthesia Evaluation  Patient identified by MRN, date of birth, ID band Patient awake    Reviewed: Allergy & Precautions, NPO status , Patient's Chart, lab work & pertinent test results  History of Anesthesia Complications Negative for: history of anesthetic complications  Airway Mallampati: III  TM Distance: >3 FB Neck ROM: Full    Dental  (+) Dental Advisory Given, Caps   Pulmonary   Mediastinal lymphadenopathy    breath sounds clear to auscultation       Cardiovascular hypertension, Pt. on home beta blockers and Pt. on medications (-) angina Rhythm:Regular Rate:Normal   Pt was admitted to [facility] 5/28-5/29/2020 for chest discomfort and shortness of breath.  Echo and nuclear stress test unremarkable    Neuro/Psych PSYCHIATRIC DISORDERS Anxiety Depression negative neurological ROS     GI/Hepatic negative GI ROS, Neg liver ROS,   Endo/Other  diabetes, Type 2, Oral Hypoglycemic AgentsHypothyroidism Morbid obesity Hashimoto's thyroiditis  Renal/GU negative Renal ROS     Musculoskeletal negative musculoskeletal ROS (+)   Abdominal   Peds  Hematology negative hematology ROS (+)   Anesthesia Other Findings   Reproductive/Obstetrics  S/p hysterectomy                            Anesthesia Physical Anesthesia Plan  ASA: III  Anesthesia Plan: General   Post-op Pain Management:    Induction: Intravenous  PONV Risk Score and Plan: 3 and Treatment may vary due to age or medical condition, Ondansetron, Dexamethasone and Midazolam  Airway Management Planned: Oral ETT  Additional Equipment: None  Intra-op Plan:   Post-operative Plan: Extubation in OR  Informed Consent: I have reviewed the patients History and Physical, chart, labs and discussed the procedure including the risks, benefits and alternatives for the proposed anesthesia with the patient or authorized  representative who has indicated his/her understanding and acceptance.     Dental advisory given  Plan Discussed with: CRNA and Anesthesiologist  Anesthesia Plan Comments:       Anesthesia Quick Evaluation

## 2019-04-17 NOTE — Progress Notes (Signed)
Anesthesia Chart Review:   Case: [id number] Date/Time: 04/18/19 0715   Procedures:      VIDEO BRONCHOSCOPY WITH ENDOBRONCHIAL ULTRASOUND (N/A )     POSSIBLE MEDIASTINOSCOPY (N/A )   Anesthesia type: General   Pre-op diagnosis: ADENOPATHY   Location: [facility] OR ROOM 45 / [place] OR   Surgeon: Grace Isaac, MD      DISCUSSION:  - Pt is a 53 year old female with hx HTN, DM, fatty liver, Hashimoto's thyroiditis.   - Pt was admitted to [facility] 5/28-5/29/2020 for chest discomfort and shortness of breath.  Echo and nuclear stress test unremarkable.  Mediastinal lymphadenopathy identified on this admission (concern for lymphoma).   VS: BP 109/63   Pulse 69   Temp (!) 36.3 C (Temporal)   Resp 20   Ht 5' 10.5" (1.791 m)   Wt 135.6 kg   SpO2 98%   BMI 42.30 kg/m    PROVIDERS: - PCP is Theodosia Blender  LABS: Labs reviewed: Acceptable for surgery. (all labs ordered are listed, but only abnormal results are displayed)  Labs Reviewed  COMPREHENSIVE METABOLIC PANEL - Abnormal; Notable for the following components:      Result Value   Glucose, Bld 146 (*)    All other components within normal limits  HEMOGLOBIN A1C - Abnormal; Notable for the following components:   Hgb A1c MFr Bld 6.9 (*)    All other components within normal limits  APTT  CBC  PROTIME-INR  TYPE AND SCREEN     IMAGES:  - CXR 04/16/19:  1.  No acute cardiopulmonary process. 2. Mediastinal adenopathy   EKG 04/16/19: NSR   CV:  Echo 03/21/2019 ([place]): 1.  LV size normal.  LV wall thickness normal.  Normal global LV contractility.  Overall LV systolic function normal, EF 60-65%. 2.  No significant valvular lesions.  Nuclear stress test 03/21/2019 ([place]): 1.  No reversible ischemia or infarction 2.  Normal LV wall motion. 3.  LVEF 83%.   Past Medical History:  Diagnosis Date  . Anxiety   . Depression   . Diabetes mellitus without complication ([place])   . Fatty liver   . Goiter    . Hashimoto's thyroiditis   . Hilar adenopathy 03/20/19  04/11/19   CTA CHEST and PET per DR. Lauretta Chester LEWIS  . History of kidney stones   . Hypertension   . Mediastinal adenopathy    PER CTA CHEST PET SCAN per DR. Beryle Lathe LEWIS  . Pneumonia   . Supraclavicular adenopathy 03/20/2019   PER CTA CHEST and PET per DR. Warnell Forester    Past Surgical History:  Procedure Laterality Date  . ABDOMINAL HYSTERECTOMY     COMPLETE  . ADHESIOLYSIS    . APPENDECTOMY    . CESAREAN SECTION      MEDICATIONS: . atenolol-chlorthalidone (TENORETIC) 50-25 MG tablet  . atorvastatin (LIPITOR) 40 MG tablet  . ibuprofen (ADVIL) 200 MG tablet  . levothyroxine (SYNTHROID) 25 MCG tablet  . metFORMIN (GLUCOPHAGE-XR) 500 MG 24 hr tablet  . traZODone (DESYREL) 100 MG tablet  . venlafaxine XR (EFFEXOR-XR) 75 MG 24 hr capsule   No current facility-administered medications for this encounter.    Derrill Memo ON 04/18/2019] ceFAZolin (ANCEF) 3 g in dextrose 5 % 50 mL IVPB    If no changes, I anticipate pt can proceed with surgery as scheduled.   Willeen Cass, FNP-BC [facility] Short Stay Surgical Center/Anesthesiology Phone: [phone] 04/17/2019 10:26 AM

## 2019-04-18 ENCOUNTER — Ambulatory Visit (HOSPITAL_COMMUNITY): Payer: BC Managed Care – PPO | Admitting: Anesthesiology

## 2019-04-18 ENCOUNTER — Encounter (HOSPITAL_COMMUNITY): Payer: Self-pay | Admitting: *Deleted

## 2019-04-18 ENCOUNTER — Encounter (HOSPITAL_COMMUNITY)
Admission: RE | Disposition: A | Discharge status: Home / Self Care | Payer: Self-pay | Source: Home / Self Care | Attending: Cardiothoracic Surgery

## 2019-04-18 ENCOUNTER — Ambulatory Visit (HOSPITAL_COMMUNITY)
Admission: RE | Admit: 2019-04-18 | Discharge: 2019-04-18 | Disposition: A | Discharge status: Home / Self Care | Payer: BC Managed Care – PPO | Attending: Cardiothoracic Surgery | Admitting: Cardiothoracic Surgery

## 2019-04-18 ENCOUNTER — Ambulatory Visit (HOSPITAL_COMMUNITY): Payer: BC Managed Care – PPO | Admitting: Emergency Medicine

## 2019-04-18 ENCOUNTER — Ambulatory Visit (HOSPITAL_COMMUNITY): Payer: BC Managed Care – PPO

## 2019-04-18 DIAGNOSIS — E049 Nontoxic goiter, unspecified: Secondary | ICD-10-CM | POA: Diagnosis not present

## 2019-04-18 DIAGNOSIS — Z885 Allergy status to narcotic agent status: Secondary | ICD-10-CM | POA: Diagnosis not present

## 2019-04-18 DIAGNOSIS — K760 Fatty (change of) liver, not elsewhere classified: Secondary | ICD-10-CM | POA: Diagnosis not present

## 2019-04-18 DIAGNOSIS — Z6841 Body Mass Index (BMI) 40.0 and over, adult: Secondary | ICD-10-CM | POA: Diagnosis not present

## 2019-04-18 DIAGNOSIS — I10 Essential (primary) hypertension: Secondary | ICD-10-CM | POA: Insufficient documentation

## 2019-04-18 DIAGNOSIS — Z09 Encounter for follow-up examination after completed treatment for conditions other than malignant neoplasm: Secondary | ICD-10-CM

## 2019-04-18 DIAGNOSIS — R61 Generalized hyperhidrosis: Secondary | ICD-10-CM | POA: Diagnosis not present

## 2019-04-18 DIAGNOSIS — E119 Type 2 diabetes mellitus without complications: Secondary | ICD-10-CM | POA: Diagnosis not present

## 2019-04-18 DIAGNOSIS — R509 Fever, unspecified: Secondary | ICD-10-CM | POA: Diagnosis not present

## 2019-04-18 DIAGNOSIS — E063 Autoimmune thyroiditis: Secondary | ICD-10-CM | POA: Insufficient documentation

## 2019-04-18 DIAGNOSIS — J984 Other disorders of lung: Secondary | ICD-10-CM | POA: Diagnosis not present

## 2019-04-18 DIAGNOSIS — R590 Localized enlarged lymph nodes: Secondary | ICD-10-CM | POA: Diagnosis not present

## 2019-04-18 DIAGNOSIS — Z888 Allergy status to other drugs, medicaments and biological substances status: Secondary | ICD-10-CM | POA: Diagnosis not present

## 2019-04-18 HISTORY — PX: VIDEO BRONCHOSCOPY WITH ENDOBRONCHIAL ULTRASOUND: SHX6177

## 2019-04-18 HISTORY — PX: MEDIASTINOSCOPY: SHX5086

## 2019-04-18 LAB — GLUCOSE, CAPILLARY
Glucose-Capillary: 171 mg/dL — ABNORMAL HIGH (ref 70–99)
Glucose-Capillary: 180 mg/dL — ABNORMAL HIGH (ref 70–99)

## 2019-04-18 SURGERY — BRONCHOSCOPY, WITH EBUS
Anesthesia: General | Site: Chest

## 2019-04-18 MED ORDER — DEXAMETHASONE SODIUM PHOSPHATE 10 MG/ML IJ SOLN
INTRAMUSCULAR | Status: DC | PRN
  Administered 2019-04-18: 10 mg via INTRAVENOUS

## 2019-04-18 MED ORDER — PHENYLEPHRINE 40 MCG/ML (10ML) SYRINGE FOR IV PUSH (FOR BLOOD PRESSURE SUPPORT)
PREFILLED_SYRINGE | INTRAVENOUS | Status: DC | PRN
  Administered 2019-04-18: 120 ug via INTRAVENOUS

## 2019-04-18 MED ORDER — TRAMADOL HCL 50 MG PO TABS: 50.0000 mg | ORAL_TABLET | Freq: Four times a day (QID) | ORAL | 0 refills | Status: AC | PRN

## 2019-04-18 MED ORDER — PROPOFOL 10 MG/ML IV BOLUS
INTRAVENOUS | Status: AC
  Filled 2019-04-18: qty 20

## 2019-04-18 MED ORDER — ROCURONIUM BROMIDE 10 MG/ML (PF) SYRINGE
PREFILLED_SYRINGE | INTRAVENOUS | Status: DC | PRN
  Administered 2019-04-18: 60 mg via INTRAVENOUS
  Administered 2019-04-18: 20 mg via INTRAVENOUS

## 2019-04-18 MED ORDER — MIDAZOLAM HCL 2 MG/2ML IJ SOLN
INTRAMUSCULAR | Status: AC
  Filled 2019-04-18: qty 2

## 2019-04-18 MED ORDER — ONDANSETRON HCL 4 MG/2ML IJ SOLN
INTRAMUSCULAR | Status: DC | PRN
  Administered 2019-04-18: 4 mg via INTRAVENOUS

## 2019-04-18 MED ORDER — LIDOCAINE 2% (20 MG/ML) 5 ML SYRINGE
INTRAMUSCULAR | Status: DC | PRN
  Administered 2019-04-18: 60 mg via INTRAVENOUS

## 2019-04-18 MED ORDER — 0.9 % SODIUM CHLORIDE (POUR BTL) OPTIME
TOPICAL | Status: DC | PRN
  Administered 2019-04-18: 1000 mL

## 2019-04-18 MED ORDER — SODIUM CHLORIDE 0.9 % IV SOLN
INTRAVENOUS | Status: DC | PRN
  Administered 2019-04-18: 15 ug/min via INTRAVENOUS

## 2019-04-18 MED ORDER — SODIUM CHLORIDE 0.9 % IR SOLN
Status: DC | PRN
  Administered 2019-04-18: 250 mL

## 2019-04-18 MED ORDER — MIDAZOLAM HCL 5 MG/5ML IJ SOLN
INTRAMUSCULAR | Status: DC | PRN
  Administered 2019-04-18: 2 mg via INTRAVENOUS

## 2019-04-18 MED ORDER — SUGAMMADEX SODIUM 200 MG/2ML IV SOLN
INTRAVENOUS | Status: DC | PRN
  Administered 2019-04-18: 200 mg via INTRAVENOUS

## 2019-04-18 MED ORDER — FENTANYL CITRATE (PF) 100 MCG/2ML IJ SOLN
INTRAMUSCULAR | Status: DC | PRN
  Administered 2019-04-18: 100 ug via INTRAVENOUS
  Administered 2019-04-18: 50 ug via INTRAVENOUS
  Administered 2019-04-18: 25 ug via INTRAVENOUS

## 2019-04-18 MED ORDER — ONDANSETRON HCL 4 MG/2ML IJ SOLN
INTRAMUSCULAR | Status: AC
  Filled 2019-04-18: qty 2

## 2019-04-18 MED ORDER — FENTANYL CITRATE (PF) 250 MCG/5ML IJ SOLN
INTRAMUSCULAR | Status: AC
  Filled 2019-04-18: qty 5

## 2019-04-18 MED ORDER — HEMOSTATIC AGENTS (NO CHARGE) OPTIME
TOPICAL | Status: DC | PRN
  Administered 2019-04-18: 1 via TOPICAL

## 2019-04-18 MED ORDER — LACTATED RINGERS IV SOLN
INTRAVENOUS | Status: DC | PRN
  Administered 2019-04-18: 07:00:00 via INTRAVENOUS

## 2019-04-18 MED ORDER — EPINEPHRINE PF 1 MG/ML IJ SOLN
INTRAMUSCULAR | Status: AC
  Filled 2019-04-18: qty 2

## 2019-04-18 MED ORDER — PROPOFOL 10 MG/ML IV BOLUS
INTRAVENOUS | Status: DC | PRN
  Administered 2019-04-18: 160 mg via INTRAVENOUS

## 2019-04-18 MED ORDER — DEXAMETHASONE SODIUM PHOSPHATE 10 MG/ML IJ SOLN
INTRAMUSCULAR | Status: AC
  Filled 2019-04-18: qty 1

## 2019-04-18 SURGICAL SUPPLY — 67 items
ADAPTER VALVE BIOPSY EBUS (MISCELLANEOUS) IMPLANT
ADPTR VALVE BIOPSY EBUS (MISCELLANEOUS) ×1
BLADE SURG 10 STRL SS (BLADE) ×2 IMPLANT
BRUSH CYTOL CELLEBRITY 1.5X140 (MISCELLANEOUS) IMPLANT
BRUSH SCRUB EZ PLAIN DRY (MISCELLANEOUS) ×2 IMPLANT
CANISTER SUCT 3000ML PPV (MISCELLANEOUS) ×4 IMPLANT
CLIP VESOCCLUDE MED 6/CT (CLIP) IMPLANT
CONT SPEC 4OZ CLIKSEAL STRL BL (MISCELLANEOUS) ×9 IMPLANT
COVER BACK TABLE 60X90IN (DRAPES) ×2 IMPLANT
COVER DOME SNAP 22 D (MISCELLANEOUS) ×1 IMPLANT
COVER SURGICAL LIGHT HANDLE (MISCELLANEOUS) ×3 IMPLANT
COVER WAND RF STERILE (DRAPES) ×1 IMPLANT
DERMABOND ADVANCED (GAUZE/BANDAGES/DRESSINGS) ×1
DERMABOND ADVANCED .7 DNX12 (GAUZE/BANDAGES/DRESSINGS) ×1 IMPLANT
DRAPE LAPAROTOMY T 102X78X121 (DRAPES) ×2 IMPLANT
DRSG AQUACEL AG ADV 3.5X14 (GAUZE/BANDAGES/DRESSINGS) ×1 IMPLANT
ELECT BLADE 4.0 EZ CLEAN MEGAD (MISCELLANEOUS) ×2
ELECT CAUTERY BLADE 6.4 (BLADE) ×2 IMPLANT
ELECT REM PT RETURN 9FT ADLT (ELECTROSURGICAL) ×2
ELECTRODE BLDE 4.0 EZ CLN MEGD (MISCELLANEOUS) IMPLANT
ELECTRODE REM PT RTRN 9FT ADLT (ELECTROSURGICAL) ×1 IMPLANT
FORCEPS BIOP RJ4 1.8 (CUTTING FORCEPS) IMPLANT
GAUZE 4X4 16PLY RFD (DISPOSABLE) ×2 IMPLANT
GAUZE SPONGE 4X4 12PLY STRL (GAUZE/BANDAGES/DRESSINGS) ×4 IMPLANT
GLOVE BIO SURGEON STRL SZ 6.5 (GLOVE) ×6 IMPLANT
GOWN STRL REUS W/ TWL LRG LVL3 (GOWN DISPOSABLE) ×1 IMPLANT
GOWN STRL REUS W/TWL LRG LVL3 (GOWN DISPOSABLE) ×3
HEMOSTAT SURGICEL 2X14 (HEMOSTASIS) IMPLANT
KIT BASIN OR (CUSTOM PROCEDURE TRAY) ×2 IMPLANT
KIT CLEAN ENDO COMPLIANCE (KITS) ×5 IMPLANT
KIT TURNOVER KIT B (KITS) ×3 IMPLANT
MARKER SKIN DUAL TIP RULER LAB (MISCELLANEOUS) ×2 IMPLANT
NDL ASPIRATION VIZISHOT 19G (NEEDLE) IMPLANT
NDL ASPIRATION VIZISHOT 21G (NEEDLE) ×1 IMPLANT
NDL FILTER BLUNT 18X1 1/2 (NEEDLE) IMPLANT
NDL HYPO 18GX1.5 BLUNT FILL (NEEDLE) IMPLANT
NEEDLE ASPIRATION VIZISHOT 19G (NEEDLE) ×2 IMPLANT
NEEDLE ASPIRATION VIZISHOT 21G (NEEDLE) IMPLANT
NEEDLE FILTER BLUNT 18X 1/2SAF (NEEDLE)
NEEDLE FILTER BLUNT 18X1 1/2 (NEEDLE) IMPLANT
NEEDLE HYPO 18GX1.5 BLUNT FILL (NEEDLE) ×2 IMPLANT
NS IRRIG 1000ML POUR BTL (IV SOLUTION) ×5 IMPLANT
OIL SILICONE PENTAX (PARTS (SERVICE/REPAIRS)) ×2 IMPLANT
PACK SURGICAL SETUP 50X90 (CUSTOM PROCEDURE TRAY) ×2 IMPLANT
PAD ARMBOARD 7.5X6 YLW CONV (MISCELLANEOUS) ×6 IMPLANT
PENCIL BUTTON HOLSTER BLD 10FT (ELECTRODE) ×2 IMPLANT
SOL PREP PROV IODINE SCRUB 4OZ (MISCELLANEOUS) ×2 IMPLANT
SPONGE INTESTINAL PEANUT (DISPOSABLE) IMPLANT
STAPLER VISISTAT 35W (STAPLE) IMPLANT
SUT VIC AB 3-0 SH 18 (SUTURE) ×3 IMPLANT
SUT VICRYL 4-0 PS2 18IN ABS (SUTURE) ×2 IMPLANT
SWAB COLLECTION DEVICE MRSA (MISCELLANEOUS) IMPLANT
SWAB CULTURE ESWAB REG 1ML (MISCELLANEOUS) IMPLANT
SYR 10ML LL (SYRINGE) ×3 IMPLANT
SYR 20CC LL (SYRINGE) ×2 IMPLANT
SYR 20ML ECCENTRIC (SYRINGE) ×3 IMPLANT
SYR 3ML LL SCALE MARK (SYRINGE) ×1 IMPLANT
SYR 5ML LL (SYRINGE) ×1 IMPLANT
TOWEL GREEN STERILE (TOWEL DISPOSABLE) ×3 IMPLANT
TOWEL GREEN STERILE FF (TOWEL DISPOSABLE) ×3 IMPLANT
TRAP SPECIMEN MUCOUS 40CC (MISCELLANEOUS) ×2 IMPLANT
TUBE CONNECTING 12X1/4 (SUCTIONS) ×3 IMPLANT
TUBE CONNECTING 20X1/4 (TUBING) ×2 IMPLANT
VALVE BIOPSY  SINGLE USE (MISCELLANEOUS) ×1
VALVE BIOPSY SINGLE USE (MISCELLANEOUS) ×1 IMPLANT
VALVE SUCTION BRONCHIO DISP (MISCELLANEOUS) ×2 IMPLANT
WATER STERILE IRR 1000ML POUR (IV SOLUTION) ×4 IMPLANT

## 2019-04-18 NOTE — Brief Op Note (Signed)
      [address]       [place] [address]             [id number]     04/18/2019  10:10 AM  PATIENT:  Holly [name]  53 y.o. 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   Delay start of Pharmacological VTE agent (>24hrs) due to surgical blood loss or risk of bleeding: yes

## 2019-04-18 NOTE — Anesthesia Postprocedure Evaluation (Signed)
Anesthesia Post Note  Patient: Holly [name]  Procedure(s) Performed: VIDEO BRONCHOSCOPY WITH ENDOBRONCHIAL ULTRASOUND (N/A Chest) MEDIASTINOSCOPY (N/A Chest)     Patient location during evaluation: PACU Anesthesia Type: General Level of consciousness: awake and alert Pain management: pain level controlled Vital Signs Assessment: post-procedure vital signs reviewed and stable Respiratory status: spontaneous breathing, nonlabored ventilation and respiratory function stable Cardiovascular status: blood pressure returned to baseline and stable Postop Assessment: no apparent nausea or vomiting Anesthetic complications: no    Last Vitals:  Vitals:   04/18/19 1058 04/18/19 1110  BP: 115/74 108/63  Pulse: 70 71  Resp:  16  Temp:    SpO2: 97% 93%    Last Pain:  Vitals:   04/18/19 0559  TempSrc:   PainSc: 0-No pain                 Audry Pili

## 2019-04-18 NOTE — Anesthesia Procedure Notes (Signed)
Procedure Name: Intubation Date/Time: 04/18/2019 7:24 AM Performed by: Kyung Rudd, CRNA Pre-anesthesia Checklist: Patient identified, Emergency Drugs available, Suction available and Patient being monitored Patient Re-evaluated:Patient Re-evaluated prior to induction Oxygen Delivery Method: Circle system utilized Preoxygenation: Pre-oxygenation with 100% oxygen Induction Type: IV induction Ventilation: Mask ventilation without difficulty Laryngoscope Size: Mac and 4 Grade View: Grade III Tube type: Oral Tube size: 8.5 mm Number of attempts: 1 Airway Equipment and Method: Stylet Placement Confirmation: positive ETCO2 and breath sounds checked- equal and bilateral Secured at: 21 cm Tube secured with: Tape Dental Injury: Teeth and Oropharynx as per pre-operative assessment

## 2019-04-18 NOTE — Discharge Instructions (Signed)
Tissue Adhesive Wound Care Some cuts and wounds can be closed with skin glue (tissue adhesive). Skin glue holds the skin together and helps your wound heal faster. Skin glue goes away on its own as your wound gets better. Follow these instructions at home:  Wound care  Showers are allowed 24 hours after treatment. Do not soak the wound in water. Do not take baths, swim, or use hot tubs. Do not use soaps or creams on your wound.  If a bandage (dressing) was put on the wound: ? Wash your hands with soap and water before you change your bandage. ? Change the bandage as often as told by your doctor. ? Leave skin glue in place. It will fall off on its own after 7-10 days. ? Keep the bandage dry.  Do not scratch, rub, or pick at the skin glue.  Do not put tape over the skin glue. The skin glue could come off when you take the tape off.  Protect the wound from another injury.  Protect the wound from sun and tanning beds. General instructions  Take over-the-counter and prescription medicines only as told by your doctor.  Keep all follow-up visits as told by your doctor. This is important. Get help right away if:  Your wound is red, puffy (swollen), hot, or tender.  You get a rash after the glue is put on.  You have more pain in the wound.  You have a red streak going away from the wound.  You have yellowish-white fluid (pus) coming from the wound.  You have more bleeding.  You have a fever.  You have chills and you start to shake.  You notice a bad smell coming from the wound.  Your wound or skin glue breaks open. This information is not intended to replace advice given to you by your health care provider. Make sure you discuss any questions you have with your health care provider. Document Released: 07/18/2008 Document Revised: 09/01/2016 Document Reviewed: 09/01/2016 Elsevier Interactive Patient Education  2019 [employer].  Flexible Bronchoscopy, Care After This  sheet gives you information about how to care for yourself after your procedure. Your health care provider may also give you more specific instructions. If you have problems or questions, contact your health care provider. What can I expect after the procedure? After the procedure, it is common to have the following symptoms for 24-48 hours:  A cough that is worse than it was before the procedure.  A low-grade fever.  A sore throat or hoarse voice.  Small streaks of blood in the mucus from your lungs (sputum), if tissue samples were removed (biopsy). Follow these instructions at home: Eating and drinking  Do not eat or drink anything (including water) for 2 hours after your procedure, or until your numbing medicine (local anesthetic) has worn off. Having a numb throat increases your risk of burning yourself or choking.  After your numbness is gone and your cough and gag reflexes have returned, you may start eating only soft foods and slowly drinking liquids.  The day after the procedure, return to your normal diet. Driving  Do not drive for 24 hours if you were given a medicine to help you relax (sedative).  Do not drive or use heavy machinery while taking prescription pain medicine. General instructions   Take over-the-counter and prescription medicines only as told by your health care provider.  Return to your normal activities as told by your health care provider. Ask your  health care provider what activities are safe for you.  Do not use any products that contain nicotine or tobacco, such as cigarettes and e-cigarettes. If you need help quitting, ask your health care provider.  Keep all follow-up visits as told by your health care provider. This is important, especially if you had a biopsy taken. Get help right away if:  You have shortness of breath that gets worse.  You become light-headed or feel like you might faint.  You have chest pain.  You cough up more than a  small amount of blood.  The amount of blood you cough up increases. Summary  Common symptoms in the 24-48 hours following a flexible bronchoscopy include cough, low-grade fever, sore throat or hoarse voice, and blood-streaked mucus from the lungs (if you had a biopsy).  Do not eat or drink anything (including water) for 2 hours after your procedure, or until your local anesthetic has worn off. You can return to your normal diet the day after the procedure.  Get help right away if you develop worsening shortness of breath, have chest pain, become light-headed, or cough up more than a small amount of blood. This information is not intended to replace advice given to you by your health care provider. Make sure you discuss any questions you have with your health care provider. Document Released: 04/28/2005 Document Revised: 09/21/2017 Document Reviewed: 10/27/2016 Elsevier Patient Education  2020 [employer].

## 2019-04-18 NOTE — Transfer of Care (Signed)
Immediate Anesthesia Transfer of Care Note  Patient: Holly [name]  Procedure(s) Performed: VIDEO BRONCHOSCOPY WITH ENDOBRONCHIAL ULTRASOUND (N/A Chest) MEDIASTINOSCOPY (N/A Chest)  Patient Location: PACU  Anesthesia Type:General  Level of Consciousness: awake, alert  and oriented  Airway & Oxygen Therapy: Patient Spontanous Breathing and Patient connected to face mask oxygen  Post-op Assessment: Report given to RN, Post -op Vital signs reviewed and stable and Patient moving all extremities X 4  Post vital signs: Reviewed and stable  Last Vitals:  Vitals Value Taken Time  BP 113/77 04/18/19 1003  Temp    Pulse 80 04/18/19 1007  Resp 19 04/18/19 1007  SpO2 92 % 04/18/19 1007  Vitals shown include unvalidated device data.  Last Pain:  Vitals:   04/18/19 0559  TempSrc:   PainSc: 0-No pain      Patients Stated Pain Goal: 4 (31/49/70 2637)  Complications: No apparent anesthesia complications

## 2019-04-18 NOTE — Progress Notes (Signed)
Ambulated x 400 feet w/out issues/ holding ra o2 sat at 93 afterwards/ dr gerhardt shares with her unable to reach friend or fiancee for d/c discussion

## 2019-04-18 NOTE — H&P (Signed)
[address]       [place] [address]             [phone]                    Tressia Lamke [place] Medical Record #[id number] Date of Birth: 03/04/1966  Referring: Marice Potter, MD Primary Care: Patient, No Pcp Per Primary Cardiologist: No primary care provider on file.  Chief Complaint:    Chief Complaint  Patient presents with  . Lung Lesion    Surgical eval, CTA Chest 03/20/19, PET Scan 04/01/19- in PAC's    History of Present Illness:    Holly [name] 53 y.o. female is seen in the office for evaluation of mediastinal adenopathy.  Patient notes in the winter 2020 having episodes of respiratory infection, with shortness of breath.  Recently a CT scan of the chest was done following with a PET scan that suggested mediastinal adenopathy which is hypermetabolic,.  Patient has noted mild low-grade episodic fever, weight loss of approximately 20 pounds over 6 months. Patient is a lifelong non-smoker.   Current Activity/ Functional Status:  Patient is independent with mobility/ambulation, transfers, ADL's, IADL's.   Zubrod Score: At the time of surgery this patient's most appropriate activity status/level should be described as: [x]     0    Normal activity, no symptoms []     1    Restricted in physical strenuous activity but ambulatory, able to do out light work []     2    Ambulatory and capable of self care, unable to do work activities, up and about               >50 % of waking hours                              []     3    Only limited self care, in bed greater than 50% of waking hours []     4    Completely disabled, no self care, confined to bed or chair []     5    Moribund   Past Medical History:  Diagnosis Date  . Anxiety   . Depression   . Diabetes mellitus without complication ([place])   . Fatty liver   . Goiter   . Hashimoto's thyroiditis   . Hilar adenopathy 03/20/19  04/11/19   CTA CHEST and PET per DR. Lauretta Chester LEWIS  . History of kidney  stones   . Hypertension   . Mediastinal adenopathy    PER CTA CHEST PET SCAN per DR. Beryle Lathe LEWIS  . Pneumonia   . Supraclavicular adenopathy 03/20/2019   PER CTA CHEST and PET per DR. Warnell Forester    Past Surgical History:  Procedure Laterality Date  . ABDOMINAL HYSTERECTOMY     COMPLETE  . ADHESIOLYSIS    . APPENDECTOMY    . CESAREAN SECTION      Family History  Problem Relation Age of Onset  . Anuerysm Mother        BRAIN  . Cancer Mother        BREAST  . COPD Father   . Hyperlipidemia Father   . Congestive Heart Failure Father   . Bipolar disorder Sister   . Diabetes Sister   . Fibromyalgia Sister   . Cancer Paternal Uncle        LUNG  . Cancer  Maternal Grandmother 46       BREAST  . Cancer Paternal Grandfather        LUNG     Social History   Tobacco Use  Smoking Status Never Smoker  Smokeless Tobacco Never Used    Social History   Substance and Sexual Activity  Alcohol Use Yes   Comment: VERY RARE OCCASION     Allergies  Allergen Reactions  . Avelox [Moxifloxacin Hcl In Nacl]   . Cymbalta [Duloxetine Hcl]   . Oxycodone-Acetaminophen     Current Facility-Administered Medications  Medication Dose Route Frequency Provider Last Rate Last Dose  . ceFAZolin (ANCEF) 3 g in dextrose 5 % 50 mL IVPB  3 g Intravenous To OR Delight OvensGerhardt, Constantina Laseter B, MD        Pertinent items are noted in HPI.   Review of Systems:     Cardiac Review of Systems: [Y] = yes  or   [ N ] = no   Chest Pain [ n   ]  Resting SOB [  n ] Exertional SOB  Cove.Etienne[y  ]  Orthopnea [ n ]   Pedal Edema Milo.Brash[n   ]    Palpitations [ n ] Syncope  [ n ]   Presyncope [ n  ]   General Review of Systems: [Y] = yes [  ]=no Constitional: recent weight change [  ];  Wt loss over the last 3 months [   ] anorexia [  ]; fatigue [  ]; nausea [  ]; night sweats [  ]; fever [  ]; or chills [  ];           Eye : blurred vision [  ]; diplopia [   ]; vision changes [  ];  Amaurosis fugax[  ]; Resp: cough y[  ];   wheezing[ y ];  hemoptysis[  ]; shortness of breath[ y ]; paroxysmal nocturnal dyspnea[  ]; dyspnea on exertion[ y ]; or orthopnea[  ];  GI:  gallstones[  ], vomiting[  ];  dysphagia[  ]; melena[  ];  hematochezia [  ]; heartburn[  ];   Hx of  Colonoscopy[  ]; GU: kidney stones [  ]; hematuria[  ];   dysuria [  ];  nocturia[  ];  history of     obstruction [  ]; urinary frequency [  ]             Skin: rash, swelling[  ];, hair loss[  ];  peripheral edema[  ];  or itching[  ]; Musculosketetal: myalgias[  ];  joint swelling[  ];  joint erythema[  ];  joint pain[  ];  back pain[  ];  Heme/Lymph: bruising[  ];  bleeding[  ];  anemia[  ];  Neuro: TIA[  ];  headaches[  ];  stroke[  ];  vertigo[  ];  seizures[  ];   paresthesias[  ];  difficulty walking[  ];  Psych:depression[y  ]; anxiety[ y ];  Endocrine: diabetes[y  ];  thyroid dysfunction[  ];  Immunizations: Flu up to date [  ]; Pneumococcal up to date [  ];  Other:     PHYSICAL EXAMINATION: BP 137/69   Pulse 70   Temp 98.1 F (36.7 C) (Oral)   Resp 20   Ht 5' 10.5" (1.791 m)   Wt 135.6 kg   SpO2 95%   BMI 42.30 kg/m  General appearance: alert, cooperative, appears stated age and no distress  Head: Normocephalic, without obvious abnormality, atraumatic Neck: no adenopathy, no carotid bruit, no JVD, supple, symmetrical, trachea midline and thyroid not enlarged, symmetric, no tenderness/mass/nodules Lymph nodes: Cervical, supraclavicular, and axillary nodes normal. Resp: clear to auscultation bilaterally Back: symmetric, no curvature. ROM normal. No CVA tenderness. Cardio: regular rate and rhythm, S1, S2 normal, no murmur, click, rub or gallop GI: soft, non-tender; bowel sounds normal; no masses,  no organomegaly Extremities: extremities normal, atraumatic, no cyanosis or edema Neurologic: Grossly normal Patient has known bilateral thyroid goiter but this not easily palpable on physical exam, suggestion of scan of right  supraclavicular adenopathy this is also not palpable. Diagnostic Studies & Laboratory data:     Recent Radiology Findings:   CLINICAL DATA: Shortness of breath  EXAM: CT ANGIOGRAPHY CHEST WITH CONTRAST  TECHNIQUE: Multidetector CT imaging of the chest was performed using the standard protocol during bolus administration of intravenous contrast. Multiplanar CT image reconstructions and MIPs were obtained to evaluate the vascular anatomy.  CONTRAST: 80 mL Isovue 370 nonionic  COMPARISON: Chest CT angiogram November 13, 2016; chest radiograph Mar 20, 2019  FINDINGS: Cardiovascular: There is no demonstrable pulmonary embolus. There is no thoracic aortic aneurysm or dissection. The visualized great vessels appear unremarkable. There is no pericardial effusion or pericardial thickening.  Mediastinum/Nodes: There is thyroid enlargement consistent with multinodular goiter.  There is extensive multifocal adenopathy. There are multiple lymph nodes in the lateral aspect of the anterior mediastinum on the left, largest measuring 2.1 x 1.6 cm. There are multiple lymph nodes to the left of the aortic arch, largest measuring 2.2 x 1.7 cm. There is aortopulmonary window adenopathy with the largest individual lymph node in this area measuring 2.0 x 1.8 cm. There are multiple right paratracheal lymph nodes, largest measuring 1.9 x 1.5 cm. There are multiple lymph nodes anterior to the trachea and carina, with the largest individual node in this area measuring 2.3 x 1.8 cm. There is subcarinal adenopathy with the largest lymph node in the subcarinal region measuring 2.8 x 2.4 cm. There are left hilar lymph nodes with the largest lymph node in the left hilar region measuring 1.6 x 1.6 cm. The largest lymph node in the right hilar region measures 1.4 x 1.1 cm.  No esophageal lesions are evident.  Lungs/Pleura: There is right lower lobe atelectatic change. There is no evident edema or  consolidation. There is no evident pleural effusion or pleural thickening.  Upper Abdomen: There is hepatic steatosis. 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 Spleen is prominent measuring 14.5 x 13.3 x 7.6 cm with a measured splenic volume of 738 cubic cm. No focal splenic lesions evident. Visualized upper abdominal structures otherwise appear unremarkable.  Musculoskeletal: There are no blastic or lytic bone lesions. No chest wall lesions are evident.  Review of the MIP images confirms the above findings.  IMPRESSION: 1. No evident pulmonary embolus. No demonstrable thoracic aortic aneurysm or dissection.  2. Multifocal adenopathy, suspicious for neoplastic etiology.  3. Enlarged spleen. This finding coupled with the multifocal adenopathy raises concern for lymphoma. In this regard, nuclear medicine PET study may be most helpful for assessment for areas of abnormal metabolic activity.  4. Mild atelectatic change. No edema or consolidation. No parenchymal mass lesions.  5. Hepatic steatosis. 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  6. Findings indicative of multinodular goiter.   Electronically Signed By: Bretta BangWilliam Woodruff III  M.D. On: 03/20/2019 14:27  Principal Interpreter Name: Bretta BangWilliam Woodruff III Provider ID: [id number]   I have independently reviewed the above radiology studies  and reviewed the findings with the patient.   Final Report  CLINICAL DATA: Worsening shortness of breath x3 months, midsternal chest pain  EXAM: MYOCARDIAL IMAGING WITH SPECT (REST AND PHARMACOLOGIC-STRESS)  GATED LEFT VENTRICULAR WALL MOTION STUDY  LEFT VENTRICULAR EJECTION FRACTION  TECHNIQUE: Standard myocardial SPECT imaging was performed after resting intravenous injection of 8 mCi Tc-3944m sestamibi. Subsequently, intravenous infusion of Lexiscan was performed under the supervision  of the Cardiology staff. At peak effect of the drug, 25 mCi Tc-5344m sestamibi was injected intravenously and standard myocardial SPECT imaging was performed. Quantitative gated imaging was also performed to evaluate left ventricular wall motion, and estimate left ventricular ejection fraction.  COMPARISON: None.  FINDINGS: Perfusion: No decreased activity in the left ventricle on stress imaging to suggest reversible ischemia or infarction.  Wall Motion: Normal left ventricular wall motion. No left ventricular dilation.  Left Ventricular Ejection Fraction: 83 %  End diastolic volume 62 ml  End systolic volume 11 ml  IMPRESSION: 1. No reversible ischemia or infarction.  2. Normal left ventricular wall motion.  3. Left ventricular ejection fraction 83%  4. Non invasive risk stratification*: Low  *2012 Appropriate Use Criteria for Coronary Revascularization Focused Update: J Am Coll Cardiol. 2012;59(9):857-881. http://content.dementiazones.comonlinejacc.org/article.aspx?articleid=1201161   Electronically Signed By: Corlis Leak Hassell M.D. On: 03/21/2019 12:42  Principal Interpreter Name: Corlis LeakD Hassell Provider ID: [id number]  Final Report  CLINICAL DATA: Initial treatment strategy for lymphadenopathy.  EXAM: NUCLEAR MEDICINE PET SKULL BASE TO THIGH  TECHNIQUE: Fourteen mCi F-18 FDG was injected intravenously. Full-ring PET imaging was performed from the skull base to thigh after the radiotracer. CT data was obtained and used for attenuation correction and anatomic localization.  Fasting blood glucose: 150 mg/dl  COMPARISON: None.  FINDINGS: Mediastinal blood pool activity: SUV max 3.3  Liver activity: SUV max 4.6  NECK: Thyroid gland is enlarged and hypermetabolic with SUV max equal 7.1. The activity is diffuse throughout the enlarged gland.  There are small hypermetabolic supraclavicular nodes. For example RIGHT supraclavicular node with SUV max equal 5.5.  Incidental CT findings:  none  CHEST: Numerous hypermetabolic enlarged mediastinal lymph nodes. Nodes involve the bilateral hilum. Paratracheal nodes, prevascular nodes, and subcarinal nodes. For example subcarinal node measures 2 cm short axis with SUV max equal 19. Prevascular node measures 15 mm with SUV max equal 17. RIGHT lower paratracheal node is enlarged with SUV max equal 22.2.  Incidental CT findings: none  ABDOMEN/PELVIS: In the upper abdomen retrocrural node/paraesophageal node measures 10 mm short axis (image 167) with SUV max equal 13.4.  Lymph node adjacent to the LEFT adrenal gland measures 12 mm SUV max equal 29.  No additional abdominal adenopathy. No pelvic hypermetabolic lymph nodes.  Spleen is normal size and normal metabolic activity.  Normal liver, pancreas. Diffuse activity in the bowel is favored physiologic.  Incidental CT findings: Prostate normal  SKELETON: No focal hypermetabolic activity to suggest skeletal metastasis.  Incidental CT findings: none  IMPRESSION: 1. Intensely hypermetabolic and enlarged supraclavicular, mediastinal, and hilar lymphadenopathy consistent with high-grade lymphoma. 2. Hypermetabolic metastatic nodes in the upper abdomen. 3. No pelvic nodal metastasis. 4. Normal spleen and bone marrow. 5. Thyroid gland is diffusely enlarged and hypermetabolic. Differential include lymphoma involvement of the thyroid gland versus thyroiditis. The activity is significantly less than the mediastinal lymph nodes therefore favor thyroiditis. Additionally, there is thyromegaly on  CT 11/13/2016.   Electronically Signed By: Genevive Bi M.D. On: 04/01/2019 12:42  Principal Interpreter Name: Genevive Bi Provider ID: [id number]       Recent Lab Findings: Lab Results  Component Value Date   WBC 7.7 04/16/2019   HGB 14.5 04/16/2019   HCT 43.3 04/16/2019   PLT 312 04/16/2019   GLUCOSE 146 (H) 04/16/2019   ALT 30 04/16/2019   AST 25 04/16/2019    NA 137 04/16/2019   K 3.5 04/16/2019   CL 101 04/16/2019   CREATININE 0.78 04/16/2019   BUN 11 04/16/2019   CO2 25 04/16/2019   INR 1.0 04/16/2019   HGBA1C 6.9 (H) 04/16/2019      Assessment / Plan:    Multifocal mediastinal adenopathy hypermetabolic on PET scan suggestive of possible lymphoma, or potentially sarcoid.  I discussed with patient and reviewed the films with her and suggested we proceed with obtaining a tissue diagnosis.  We will proceed with bronchoscopy ebus transbronchial biopsy and likely mediastinoscopy to obtain a definitive tissue diagnosis.  Risks and options of the procedure and expectations were discussed with the patient in detail and she is agreeable. . Anxiety   . Depression   . Diabetes mellitus without complication ([facility])   . Fatty liver   . Goiter   . Hashimoto's thyroiditis   . History of kidney stones   . Hypertension     The goals risks and alternatives of the planned surgical procedure Procedure(s): VIDEO BRONCHOSCOPY WITH ENDOBRONCHIAL ULTRASOUND (N/A) POSSIBLE MEDIASTINOSCOPY (N/A)  have been discussed with the patient in detail. The risks of the procedure including death, infection, stroke, myocardial infarction, bleeding, blood transfusion have all been discussed specifically.  I have quoted Holly [name] a 1 % of perioperative mortality and a complication rate as high as 20 %. The patient's questions have been answered.Holly [name] is willing  to proceed with the planned procedure.  Delight Ovens MD      [address] [place].[address] [place] [address] Office [phone]   Beeper [phone]  04/18/2019 7:13 AM

## 2019-04-19 ENCOUNTER — Encounter (HOSPITAL_COMMUNITY): Payer: Self-pay | Admitting: Cardiothoracic Surgery

## 2019-04-19 LAB — ACID FAST SMEAR (AFB, MYCOBACTERIA): Acid Fast Smear: NEGATIVE

## 2019-04-21 NOTE — Op Note (Signed)
NAMESHONTA, [name] MEDICAL RECORD [id number] ACCOUNT [id number] DATE OF BIRTH:01-28-1966 FACILITY: [facility] LOCATION: [facility] PHYSICIAN:Arius Harnois Maryruth Bun, MD  OPERATIVE REPORT  DATE OF PROCEDURE:  04/18/2019  PREOPERATIVE DIAGNOSIS:  Mediastinal adenopathy.  POSTOPERATIVE DIAGNOSIS:  Mediastinal adenopathy.  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    PROCEDURE PERFORMED:  Bronchoscopy with endobronchial ultrasound and transbronchial biopsy and mediastinoscopy.  SURGEON:  Lanelle Bal, MD  BRIEF HISTORY:  The patient is a 53 year old female who had increasing respiratory trouble over the past 6 months with some night sweats, low grade fever, ultimately had a CT scan of the chest and PET scan with diffuse mediastinal adenopathy.  The  patient was referred by Oncology for a biopsy with the concern of lymphoma.  Risks and options of biopsy were discussed with the patient who agreed and signed informed consent.  DESCRIPTION OF PROCEDURE:   The patient underwent general endotracheal anesthesia without difficulty.  Appropriate timeout was performed.  A fiberoptic bronchoscopy was performed to the subsegmental level both the right and left tracheobronchial tree.   We then placed an EBUS scope and easily identified an enlarged #7 nodes.  Transbronchial biopsies and several passes with a 19-gauge needle were done.  The initial smears of this showed lymphoid tissue but no definite diagnosis.    We then prepped the neck and upper chest.  A second timeout and draped in a sterile manner.  A second timeout was performed.  A small incision was made in the suprasternal notch and dissection carried down to the pretracheal fascia.  We entered the  pretracheal fascia and mediastinoscope was passed.  Multiple enlarged lymph nodes were noted.  Several biopsies of both #7 and 4R nodes were done and submitted to pathology.  Initial frozen section suggested granulomatous inflammation.  A portion of the  specimen was reserved for  culture.  The remaining was sent to pathologist for permanent.  With operative field hemostatic, the scope was removed.  The incision was closed with interrupted 0 Vicryl, running 3-0 Vicryl, subcutaneous tissue, 3-0  subcuticular stitch.  Dermabond was applied.    The patient was extubated and transferred to the recovery room having tolerated the procedure without obvious complication.  Blood loss was minimal.  AN/NUANCE  D:04/21/2019 T:04/21/2019 JOB:[id number]

## 2019-04-23 LAB — AEROBIC/ANAEROBIC CULTURE W GRAM STAIN (SURGICAL/DEEP WOUND): Culture: NO GROWTH

## 2019-05-05 ENCOUNTER — Encounter: Payer: Self-pay | Admitting: Emergency Medicine

## 2019-05-05 ENCOUNTER — Other Ambulatory Visit: Payer: Self-pay

## 2019-05-05 ENCOUNTER — Ambulatory Visit: Payer: BC Managed Care – PPO | Admitting: Emergency Medicine

## 2019-05-05 DIAGNOSIS — D869 Sarcoidosis, unspecified: Secondary | ICD-10-CM

## 2019-05-05 NOTE — Patient Instructions (Addendum)
We will perform pulmonary function testing We will arrange for a high-resolution CT scan of the chest without contrast for sarcoidosis Blood work today Depending on your testing above we will decide whether to start any medicines for sarcoidosis next time. You would benefit from seeing an ophthalmologist annually. Follow with Dr Lamonte Sakai next available after your testing to review.

## 2019-05-05 NOTE — Progress Notes (Signed)
Subjective:    Patient ID: Holly [name], female    DOB: 1966/03/12, 53 y.o.   MRN: [id number]  HPI 53 year old never smoker with history of diabetes, Hashimoto's thyroiditis/goiter, hypertension.  She was seen for mediastinal lymphadenopathy by Dr. Tyrone SageGerhardt with thoracic surgery.  CT scan from 03/20/2019 with widespread lymphadenopathy some bibasilar atelectatic change but no significant pulmonary infiltrates.  The lymphadenopathy was hypermetabolic by PET scan 04/01/2019.  She underwent biopsy by Dr. Tyrone SageGerhardt, pathology from 6/26 shows nonnecrotizing granulomatous inflammation.  Fungal and AFB smears were negative.  She has had intermittent dyspnea over the last several months. Happened even at rest. Now it only seems to happen w exertion. Occasional wheeze, cough.  No family hx sarcoidosis.  No asthma/    Review of Systems  Constitutional: Negative for fever and unexpected weight change.  HENT: Positive for postnasal drip and trouble swallowing. Negative for congestion, dental problem, ear pain, nosebleeds, rhinorrhea, sinus pressure, sneezing and sore throat.   Eyes: Negative for redness and itching.  Respiratory: Positive for cough, chest tightness, shortness of breath and wheezing.   Cardiovascular: Negative for palpitations and leg swelling.  Gastrointestinal: Negative for nausea and vomiting.  Genitourinary: Negative for dysuria.  Musculoskeletal: Negative for joint swelling.  Skin: Negative for rash.  Neurological: Negative for headaches.  Hematological: Does not bruise/bleed easily.  Psychiatric/Behavioral: Negative for dysphoric mood. The patient is not nervous/anxious.    Past Medical History:  Diagnosis Date  . Anxiety   . Depression   . Diabetes mellitus without complication (HCC)   . Fatty liver   . Goiter   . Hashimoto's thyroiditis   . Hilar adenopathy 03/20/19  04/11/19   CTA CHEST and PET per DR. Anne NgEQUINCY LEWIS  . History of kidney stones   . Hypertension   .  Mediastinal adenopathy    PER CTA CHEST PET SCAN per DR. Lanae CrumblyeQINCY LEWIS  . Pneumonia   . Supraclavicular adenopathy 03/20/2019   PER CTA CHEST and PET per DR. Sudie BaileyEQUNCY LEWIS     Family History  Problem Relation Age of Onset  . Anuerysm Mother        BRAIN  . Cancer Mother        BREAST  . COPD Father   . Hyperlipidemia Father   . Congestive Heart Failure Father   . Bipolar disorder Sister   . Diabetes Sister   . Fibromyalgia Sister   . Cancer Paternal Uncle        LUNG  . Cancer Maternal Grandmother 46       BREAST  . Cancer Paternal Grandfather        LUNG     Social History   Socioeconomic History  . Marital status: Divorced    Spouse name: Not on file  . Number of children: Not on file  . Years of education: Not on file  . Highest education level: Not on file  Occupational History  . Not on file  Social Needs  . Financial resource strain: Not on file  . Food insecurity    Worry: Not on file    Inability: Not on file  . Transportation needs    Medical: Not on file    Non-medical: Not on file  Tobacco Use  . Smoking status: Never Smoker  . Smokeless tobacco: Never Used  Substance and Sexual Activity  . Alcohol use: Yes    Comment: VERY RARE OCCASION  . Drug use: Not on file  . Sexual activity: Not on file  Lifestyle  . Physical activity    Days per week: Not on file    Minutes per session: Not on file  . Stress: Not on file  Relationships  . Social Herbalist on phone: Not on file    Gets together: Not on file    Attends religious service: Not on file    Active member of club or organization: Not on file    Attends meetings of clubs or organizations: Not on file    Relationship status: Not on file  . Intimate partner violence    Fear of current or ex partner: Not on file    Emotionally abused: Not on file    Physically abused: Not on file    Forced sexual activity: Not on file  Other Topics Concern  . Not on file  Social History  Narrative  . Not on file  Has worked as a Pharmacist, hospital, also hosiery mill   Allergies  Allergen Reactions  . Avelox [Moxifloxacin Hcl In Nacl]   . Cymbalta [Duloxetine Hcl]   . Oxycodone-Acetaminophen      Outpatient Medications Prior to Visit  Medication Sig Dispense Refill  . atenolol-chlorthalidone (TENORETIC) 50-25 MG tablet Take 1 tablet by mouth daily.     Marland Kitchen atorvastatin (LIPITOR) 40 MG tablet Take 40 mg by mouth daily at 6 PM.     . ibuprofen (ADVIL) 200 MG tablet Take 400 mg by mouth every 6 (six) hours as needed for headache or moderate pain.    Marland Kitchen levothyroxine (SYNTHROID) 25 MCG tablet Take 25 mcg by mouth daily before breakfast.     . metFORMIN (GLUCOPHAGE-XR) 500 MG 24 hr tablet Take 1,000 mg by mouth 2 (two) times a day.     . traZODone (DESYREL) 100 MG tablet Take 100 mg by mouth at bedtime as needed for sleep.     Marland Kitchen venlafaxine XR (EFFEXOR-XR) 75 MG 24 hr capsule Take 300 mg by mouth daily. Takes 4 (75mg ) capsules daily=300mg      No facility-administered medications prior to visit.         Objective:   Physical Exam Vitals:   05/05/19 1431  BP: 116/84  Pulse: 87  SpO2: 94%  Weight: 294 lb (133.4 kg)  Height: 5' 10.5" (1.791 m)   Gen: Pleasant, overwt woman, in no distress,  normal affect  ENT: No lesions,  mouth clear,  oropharynx clear, no postnasal drip  Neck: No JVD, no stridor, mediastinoscopy incision healing  Lungs: No use of accessory muscles, no crackles or wheezing on normal respiration, no wheeze on forced expiration  Cardiovascular: RRR, heart sounds normal, no murmur or gallops, no peripheral edema  Musculoskeletal: No deformities, no cyanosis or clubbing  Neuro: alert, awake, non focal  Skin: Warm, no lesions or rash      Assessment & Plan:  Sarcoidosis Based on review of imaging this looks like stage I disease.  She does have dyspnea and could have an asthma-like syndrome.  We will perform a high-resolution CT scan of the chest to get a  better look at her parenchyma, pulmonary function testing to assess airflow.  We will consider treating her with a course of prednisone although in absence of parenchymal disease this may not be necessary.  She may benefit from bronchodilators and we will determine this based on her PFT.  We will perform pulmonary function testing We will arrange for a high-resolution CT scan of the chest without contrast for sarcoidosis Blood work today Depending  on your testing above we will decide whether to start any medicines for sarcoidosis next time. You would benefit from seeing an ophthalmologist annually. Follow with Dr Delton CoombesByrum next available after your testing to review  Levy Pupaobert Sandip Power, MD, PhD 05/05/2019, 3:04 PM [facility] [phone] or if no answer [phone]

## 2019-05-05 NOTE — Assessment & Plan Note (Signed)
Based on review of imaging this looks like stage I disease.  She does have dyspnea and could have an asthma-like syndrome.  We will perform a high-resolution CT scan of the chest to get a better look at her parenchyma, pulmonary function testing to assess airflow.  We will consider treating her with a course of prednisone although in absence of parenchymal disease this may not be necessary.  She may benefit from bronchodilators and we will determine this based on her PFT.  We will perform pulmonary function testing We will arrange for a high-resolution CT scan of the chest without contrast for sarcoidosis Blood work today Depending on your testing above we will decide whether to start any medicines for sarcoidosis next time. You would benefit from seeing an ophthalmologist annually. Follow with Dr Lamonte Sakai next available after your testing to review

## 2019-05-06 ENCOUNTER — Ambulatory Visit: Payer: BC Managed Care – PPO | Admitting: Cardiothoracic Surgery

## 2019-05-06 VITALS — BP 98/60 | HR 74 | Temp 97.7°F | Resp 20 | Ht 70.0 in | Wt 293.0 lb

## 2019-05-06 DIAGNOSIS — R590 Localized enlarged lymph nodes: Secondary | ICD-10-CM | POA: Diagnosis not present

## 2019-05-06 DIAGNOSIS — Z9889 Other specified postprocedural states: Secondary | ICD-10-CM | POA: Diagnosis not present

## 2019-05-06 NOTE — Progress Notes (Signed)
[address]       [place] [address]             [phone]      Lace Tolsma [facility] Medical Record #[id number] Date of Birth: Oct 09, 1966  Referring: Marice Potter, MD Primary Care: Patient, No Pcp Per Primary Cardiologist: No primary care provider on file.   Chief Complaint:   POST OP FOLLOW UP OPERATIVE REPORT DATE OF PROCEDURE:  04/18/2019 PREOPERATIVE DIAGNOSIS:  Mediastinal adenopathy. POSTOPERATIVE DIAGNOSIS:  Mediastinal adenopathy.  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   PROCEDURE PERFORMED:  Bronchoscopy with endobronchial ultrasound and transbronchial biopsy and mediastinoscopy. SURGEON:  Lanelle Bal, MD  Diagnosis 1. Lymph node, biopsy, Mediastinal 7 - NON-NECROTIZING GRANULOMATA. 2. Lymph node, biopsy, Mediastinal 7 - NON-NECROTIZING GRANULOMATA. 3. Lymph node, biopsy, 4 R node - NON-NECROTIZING GRANULOMATA. Microscopic Comment 1. Special stains AFB (acid fast bacillus) and GMS (fungus) are negative. Microbiology culture results are reported separately. Gillie Manners MD History of Present Illness:      Patient doing well following recent mediastinoscopy    Past Medical History:  Diagnosis Date  . Anxiety   . Depression   . Diabetes mellitus without complication ([place])   . Fatty liver   . Goiter   . Hashimoto's thyroiditis   . Hilar adenopathy 03/20/19  04/11/19   CTA CHEST and PET per DR. Lauretta Chester LEWIS  . History of kidney stones   . Hypertension   . Mediastinal adenopathy    PER CTA CHEST PET SCAN per DR. Beryle Lathe LEWIS  . Pneumonia   . Supraclavicular adenopathy 03/20/2019   PER CTA CHEST and PET per DR. Renelda Mom LEWIS     Social History   Tobacco Use  Smoking Status Never Smoker  Smokeless Tobacco Never Used    Social History   Substance and Sexual Activity  Alcohol Use Yes   Comment: VERY RARE OCCASION     Allergies  Allergen Reactions  . Avelox [Moxifloxacin Hcl In Nacl]   . Cymbalta [Duloxetine Hcl]   .  Oxycodone-Acetaminophen     Current Outpatient Medications  Medication Sig Dispense Refill  . atenolol-chlorthalidone (TENORETIC) 50-25 MG tablet Take 1 tablet by mouth daily.     Marland Kitchen atorvastatin (LIPITOR) 40 MG tablet Take 40 mg by mouth daily at 6 PM.     . ibuprofen (ADVIL) 200 MG tablet Take 400 mg by mouth every 6 (six) hours as needed for headache or moderate pain.    Marland Kitchen levothyroxine (SYNTHROID) 25 MCG tablet Take 25 mcg by mouth daily before breakfast.     . metFORMIN (GLUCOPHAGE-XR) 500 MG 24 hr tablet Take 1,000 mg by mouth 2 (two) times a day.     . traZODone (DESYREL) 100 MG tablet Take 100 mg by mouth at bedtime as needed for sleep.     Marland Kitchen venlafaxine XR (EFFEXOR-XR) 75 MG 24 hr capsule Take 300 mg by mouth daily. Takes 4 (75mg ) capsules daily=300mg      No current facility-administered medications for this visit.        Physical Exam: BP 98/60   Pulse 74   Temp 97.7 F (36.5 C) (Skin)   Resp 20   Ht 5\' 10"  (1.778 m)   Wt 293 lb (132.9 kg)   SpO2 93% Comment: RA  BMI 42.04 kg/m   General appearance: alert, cooperative and no distress Neurologic: intact Heart: regular rate and rhythm, S1, S2 normal, no murmur, click, rub or gallop Lungs: clear to auscultation bilaterally Abdomen:  soft, non-tender; bowel sounds normal; no masses,  no organomegaly Extremities: extremities normal, atraumatic, no cyanosis or edema and Homans sign is negative, no sign of DVT Wound: Mediastinoscopy incisions healing well   Diagnostic Studies & Laboratory data:     Recent Radiology Findings:   No results found.    Recent Lab Findings: Lab Results  Component Value Date   WBC 7.7 04/16/2019   HGB 14.5 04/16/2019   HCT 43.3 04/16/2019   PLT 312 04/16/2019   GLUCOSE 146 (H) 04/16/2019   ALT 30 04/16/2019   AST 25 04/16/2019   NA 137 04/16/2019   K 3.5 04/16/2019   CL 101 04/16/2019   CREATININE 0.78 04/16/2019   BUN 11 04/16/2019   CO2 25 04/16/2019   INR 1.0 04/16/2019    HGBA1C 6.9 (H) 04/16/2019   Results for orders placed or performed during the hospital encounter of 04/18/19  Culture, fungus without smear     Status: None (Preliminary result)   Collection Time: 04/18/19  9:39 AM   Specimen: Lymph Node  Result Value Ref Range Status   Specimen Description LYMPH NODE  Final   Special Requests NONE  Final   Culture   Final    NO FUNGUS ISOLATED AFTER 14 DAYS Performed at [facility], [address] [place], [place] [address]    Report Status PENDING  Incomplete  Aerobic/Anaerobic Culture (surgical/deep wound)     Status: None   Collection Time: 04/18/19  9:39 AM   Specimen: Lymph Node  Result Value Ref Range Status   Specimen Description LYMPH NODE  Final   Special Requests NONE  Final   Gram Stain   Final    RARE WBC PRESENT, PREDOMINANTLY MONONUCLEAR NO ORGANISMS SEEN    Culture   Final    No growth aerobically or anaerobically. Performed at [facility], [address] [place], [place] [address]    Report Status 04/23/2019 FINAL  Final  Acid Fast Smear (AFB)     Status: None   Collection Time: 04/18/19  9:39 AM   Specimen: Lymph Node  Result Value Ref Range Status   AFB Specimen Processing Comment  Final    Comment: Tissue Grinding and Digestion/Decontamination   Acid Fast Smear Negative  Final    Comment: (NOTE) Performed At: [facility] [address] [place], [place] [id number] Jolene SchimkeNagendra Sanjai MD WG:[phone]    Source (AFB) LYMPH NODE  Final    Comment: Performed at [facility], [address] [place], [place] [address]     Assessment / Plan:   Patient with mediastinal adenopathy now with what appears to be sarcoidosis.  She has been seen by Dr. Delton CoombesByrum and pulmonary, is set up to have a high density CT scan and pulmonary function studies before planning any specific treatment for sarcoid.   I will plan to see her back as needed  Delight OvensEdward B Christianne Zacher MD      [address]  [place] [id number] Office [phone]   Beeper [phone]  05/06/2019 4:12 PM

## 2019-05-07 LAB — ANGIOTENSIN CONVERTING ENZYME: Angiotensin-Converting Enzyme: 48 U/L (ref 9–67)

## 2019-05-09 LAB — CULTURE, FUNGUS WITHOUT SMEAR

## 2019-05-20 ENCOUNTER — Other Ambulatory Visit: Payer: Self-pay | Admitting: Emergency Medicine

## 2019-05-23 ENCOUNTER — Other Ambulatory Visit (HOSPITAL_COMMUNITY)
Admission: RE | Admit: 2019-05-23 | Discharge: 2019-05-23 | Disposition: A | Discharge status: Home / Self Care | Payer: BC Managed Care – PPO | Source: Ambulatory Visit | Attending: Emergency Medicine | Admitting: Emergency Medicine

## 2019-05-23 DIAGNOSIS — Z20828 Contact with and (suspected) exposure to other viral communicable diseases: Secondary | ICD-10-CM | POA: Diagnosis not present

## 2019-05-23 DIAGNOSIS — Z01812 Encounter for preprocedural laboratory examination: Secondary | ICD-10-CM | POA: Insufficient documentation

## 2019-05-24 LAB — SARS CORONAVIRUS 2 (TAT 6-24 HRS): SARS Coronavirus 2: NEGATIVE

## 2019-05-26 ENCOUNTER — Ambulatory Visit (INDEPENDENT_AMBULATORY_CARE_PROVIDER_SITE_OTHER): Payer: BC Managed Care – PPO | Admitting: Emergency Medicine

## 2019-05-26 ENCOUNTER — Other Ambulatory Visit: Payer: Self-pay

## 2019-05-26 ENCOUNTER — Encounter: Payer: Self-pay | Admitting: Emergency Medicine

## 2019-05-26 ENCOUNTER — Ambulatory Visit (HOSPITAL_COMMUNITY)
Admission: RE | Admit: 2019-05-26 | Discharge: 2019-05-26 | Disposition: A | Discharge status: Home / Self Care | Payer: BC Managed Care – PPO | Source: Ambulatory Visit | Attending: Emergency Medicine | Admitting: Emergency Medicine

## 2019-05-26 DIAGNOSIS — J309 Allergic rhinitis, unspecified: Secondary | ICD-10-CM | POA: Insufficient documentation

## 2019-05-26 DIAGNOSIS — D869 Sarcoidosis, unspecified: Secondary | ICD-10-CM

## 2019-05-26 DIAGNOSIS — J301 Allergic rhinitis due to pollen: Secondary | ICD-10-CM | POA: Diagnosis not present

## 2019-05-26 HISTORY — DX: Allergic rhinitis, unspecified: J30.9

## 2019-05-26 LAB — PULMONARY FUNCTION TEST
DL/VA % pred: 123 %
DL/VA: 5.06 ml/min/mmHg/L
DLCO unc % pred: 96 %
DLCO unc: 24.86 ml/min/mmHg
FEF 25-75 Post: 2.05 L/sec
FEF 25-75 Pre: 2.13 L/sec
FEF2575-%Change-Post: -3 %
FEF2575-%Pred-Post: 66 %
FEF2575-%Pred-Pre: 69 %
FEV1-%Change-Post: 0 %
FEV1-%Pred-Post: 75 %
FEV1-%Pred-Pre: 76 %
FEV1-Post: 2.59 L
FEV1-Pre: 2.6 L
FEV1FVC-%Change-Post: 0 %
FEV1FVC-%Pred-Pre: 92 %
FEV6-%Change-Post: 0 %
FEV6-%Pred-Post: 82 %
FEV6-%Pred-Pre: 83 %
FEV6-Post: 3.49 L
FEV6-Pre: 3.51 L
FEV6FVC-%Pred-Post: 102 %
FEV6FVC-%Pred-Pre: 102 %
FVC-%Change-Post: 0 %
FVC-%Pred-Post: 80 %
FVC-%Pred-Pre: 80 %
FVC-Post: 3.49 L
FVC-Pre: 3.51 L
Post FEV1/FVC ratio: 74 %
Post FEV6/FVC ratio: 100 %
Pre FEV1/FVC ratio: 74 %
Pre FEV6/FVC Ratio: 100 %

## 2019-05-26 MED ORDER — BUDESONIDE-FORMOTEROL FUMARATE 160-4.5 MCG/ACT IN AERO: 2.0000 | INHALATION_SPRAY | Freq: Two times a day (BID) | RESPIRATORY_TRACT | 5 refills | Status: DC

## 2019-05-26 MED ORDER — PREDNISONE 10 MG PO TABS: ORAL_TABLET | ORAL | 0 refills | Status: DC

## 2019-05-26 MED ORDER — ALBUTEROL SULFATE (2.5 MG/3ML) 0.083% IN NEBU
2.5000 mg | INHALATION_SOLUTION | Freq: Once | RESPIRATORY_TRACT | Status: AC
  Administered 2019-05-26: 13:00:00 2.5 mg via RESPIRATORY_TRACT

## 2019-05-26 NOTE — Assessment & Plan Note (Signed)
She has a lot of throat clearing and has noticed some nasal drainage to her throat, sometimes associated with cough.  I think it would help her to start a nonsedating antihistamine.  We discussed this today.

## 2019-05-26 NOTE — Assessment & Plan Note (Signed)
With obstruction on today's pulmonary function testing and micronodular disease plus lymphadenopathy seen on her high-resolution CT scan of the chest on 7/21.  Based on her symptoms, her testing I think will be reasonable to treat her for a flare of her sarcoidosis with prednisone for 3 weeks and then assess for clinical improvement.  I will also start her on Symbicort to see if she benefits from scheduled bronchodilator therapy.

## 2019-05-26 NOTE — Patient Instructions (Signed)
Please take prednisone 30 mg daily for the next 3 weeks.  We will then taper off as follows: 20 mg daily for 3 days, then 10 mg daily for 3 days, then stop We will try using Symbicort 2 puffs twice a day to see if you benefit.  Please rinse and gargle after using this medication.  We will review the effects next time Please start a nonsedating antihistamine like loratadine (Claritin) 10 mg daily, Allegra, Zyrtec, etc. Follow with Dr Lamonte Sakai in 1 month

## 2019-05-26 NOTE — Progress Notes (Signed)
Subjective:    Patient ID: Holly [name], female    DOB: 1966-04-20, 53 y.o.   MRN: [id number]  HPI 53 year old never smoker with history of diabetes, Hashimoto's thyroiditis/goiter, hypertension.  She was seen for mediastinal lymphadenopathy by Dr. Tyrone SageGerhardt with thoracic surgery.  CT scan from 03/20/2019 with widespread lymphadenopathy some bibasilar atelectatic change but no significant pulmonary infiltrates.  The lymphadenopathy was hypermetabolic by PET scan 04/01/2019.  She underwent biopsy by Dr. Tyrone SageGerhardt, pathology from 6/26 shows nonnecrotizing granulomatous inflammation.  Fungal and AFB smears were negative.  She has had intermittent dyspnea over the last several months. Happened even at rest. Now it only seems to happen w exertion. Occasional wheeze, cough.  No family hx sarcoidosis.  No asthma/   ROV 05/26/2019 --follow-up visit for newly diagnosed sarcoidosis, principally stage I disease with mediastinal lymphadenopathy.  She does have some associated dyspnea.  She underwent pulmonary function testing today that I have reviewed.  This shows moderate obstruction without a bronchodilator response, diffusion capacity normal.  She c/o fatigue all the time. Has exertional SOB with minimal movement. She is doing some throat clearing, has drainage to her throat. [facility] reviewed by me, shows bulky mediastinal lymphadenopathy, subtle central para bronchovascular nodularity up to 4 mm in size some pleural calcification in the posterior right hemithorax.  I do not see any groundglass or consolidative features.   Review of Systems  Constitutional: Negative for fever and unexpected weight change.  HENT: Positive for postnasal drip and trouble swallowing. Negative for congestion, dental problem, ear pain, nosebleeds, rhinorrhea, sinus pressure, sneezing and sore throat.   Eyes: Negative for redness and itching.  Respiratory: Positive for cough, chest tightness, shortness  of breath and wheezing.   Cardiovascular: Negative for palpitations and leg swelling.  Gastrointestinal: Negative for nausea and vomiting.  Genitourinary: Negative for dysuria.  Musculoskeletal: Negative for joint swelling.  Skin: Negative for rash.  Neurological: Negative for headaches.  Hematological: Does not bruise/bleed easily.  Psychiatric/Behavioral: Negative for dysphoric mood. The patient is not nervous/anxious.    Past Medical History:  Diagnosis Date  . Anxiety   . Depression   . Diabetes mellitus without complication (HCC)   . Fatty liver   . Goiter   . Hashimoto's thyroiditis   . Hilar adenopathy 03/20/19  04/11/19   CTA CHEST and PET per DR. Anne NgEQUINCY LEWIS  . History of kidney stones   . Hypertension   . Mediastinal adenopathy    PER CTA CHEST PET SCAN per DR. Lanae CrumblyeQINCY LEWIS  . Pneumonia   . Supraclavicular adenopathy 03/20/2019   PER CTA CHEST and PET per DR. Sudie BaileyEQUNCY LEWIS     Family History  Problem Relation Age of Onset  . Anuerysm Mother        BRAIN  . Cancer Mother        BREAST  . COPD Father   . Hyperlipidemia Father   . Congestive Heart Failure Father   . Bipolar disorder Sister   . Diabetes Sister   . Fibromyalgia Sister   . Cancer Paternal Uncle        LUNG  . Cancer Maternal Grandmother 46       BREAST  . Cancer Paternal Grandfather        LUNG     Social History   Socioeconomic History  . Marital status: Divorced    Spouse name: Not on file  . Number of children: Not on file  .  Years of education: Not on file  . Highest education level: Not on file  Occupational History  . Not on file  Social Needs  . Financial resource strain: Not on file  . Food insecurity    Worry: Not on file    Inability: Not on file  . Transportation needs    Medical: Not on file    Non-medical: Not on file  Tobacco Use  . Smoking status: Never Smoker  . Smokeless tobacco: Never Used  Substance and Sexual Activity  . Alcohol use: Yes    Comment:  VERY RARE OCCASION  . Drug use: Not on file  . Sexual activity: Not on file  Lifestyle  . Physical activity    Days per week: Not on file    Minutes per session: Not on file  . Stress: Not on file  Relationships  . Social Herbalist on phone: Not on file    Gets together: Not on file    Attends religious service: Not on file    Active member of club or organization: Not on file    Attends meetings of clubs or organizations: Not on file    Relationship status: Not on file  . Intimate partner violence    Fear of current or ex partner: Not on file    Emotionally abused: Not on file    Physically abused: Not on file    Forced sexual activity: Not on file  Other Topics Concern  . Not on file  Social History Narrative  . Not on file  Has worked as a Pharmacist, hospital, also hosiery mill   Allergies  Allergen Reactions  . Avelox [Moxifloxacin Hcl In Nacl]   . Cymbalta [Duloxetine Hcl]   . Oxycodone-Acetaminophen      Outpatient Medications Prior to Visit  Medication Sig Dispense Refill  . atenolol-chlorthalidone (TENORETIC) 50-25 MG tablet Take 1 tablet by mouth daily.     Marland Kitchen atorvastatin (LIPITOR) 40 MG tablet Take 40 mg by mouth daily at 6 PM.     . ibuprofen (ADVIL) 200 MG tablet Take 400 mg by mouth every 6 (six) hours as needed for headache or moderate pain.    Marland Kitchen levothyroxine (SYNTHROID) 25 MCG tablet Take 25 mcg by mouth daily before breakfast.     . metFORMIN (GLUCOPHAGE-XR) 500 MG 24 hr tablet Take 1,000 mg by mouth 2 (two) times a day.     . traZODone (DESYREL) 100 MG tablet Take 100 mg by mouth at bedtime as needed for sleep.     Marland Kitchen venlafaxine XR (EFFEXOR-XR) 75 MG 24 hr capsule Take 300 mg by mouth daily. Takes 4 (75mg ) capsules daily=300mg      No facility-administered medications prior to visit.         Objective:   Physical Exam Vitals:   05/26/19 1433  BP: 114/76  Pulse: (!) 102  SpO2: 95%  Weight: 289 lb (131.1 kg)  Height: 5' 10.5" (1.791 m)   Gen:  Pleasant, overwt woman, in no distress,  normal affect  ENT: No lesions,  mouth clear,  oropharynx clear, no postnasal drip  Neck: No JVD, no stridor, mediastinoscopy incision heaed  Lungs: No use of accessory muscles, no crackles or wheezing on normal respiration, no wheeze on forced expiration  Cardiovascular: RRR, heart sounds normal, no murmur or gallops, no peripheral edema  Musculoskeletal: No deformities, no cyanosis or clubbing  Neuro: alert, awake, non focal  Skin: Warm, no lesions or rash  Assessment & Plan:  Sarcoidosis With obstruction on today's pulmonary function testing and micronodular disease plus lymphadenopathy seen on her high-resolution CT scan of the chest on 7/21.  Based on her symptoms, her testing I think will be reasonable to treat her for a flare of her sarcoidosis with prednisone for 3 weeks and then assess for clinical improvement.  I will also start her on Symbicort to see if she benefits from scheduled bronchodilator therapy.  Allergic rhinitis She has a lot of throat clearing and has noticed some nasal drainage to her throat, sometimes associated with cough.  I think it would help her to start a nonsedating antihistamine.  We discussed this today.  Levy Pupaobert Roniel Halloran, MD, PhD 05/26/2019, 3:09 PM [facility] [phone] or if no answer [phone]

## 2019-06-01 LAB — ACID FAST CULTURE WITH REFLEXED SENSITIVITIES (MYCOBACTERIA): Acid Fast Culture: NEGATIVE

## 2019-06-15 ENCOUNTER — Other Ambulatory Visit: Payer: Self-pay | Admitting: Emergency Medicine

## 2019-06-16 ENCOUNTER — Telehealth: Payer: Self-pay | Admitting: Emergency Medicine

## 2019-06-16 NOTE — Telephone Encounter (Signed)
Primary Pulmonologist: Dr. Lamonte Sakai Last office visit and with whom: 05/26/2019 with Dr. Lamonte Sakai  What do we see them for (pulmonary problems): Sarcoidosis   Reason for call: Increased shortness of breath, sharp pain around ribs- bilateral, coughing-dry, sore throat, hoarseness   In the last month, have you been in contact with someone who was confirmed or suspected to have [place]avirus / COVID-19?  Unknown   Do you have any of the following symptoms developed in the last 30 days? Fever: denies Cough: yes Shortness of breath: yes  When did your symptoms start?  8/23  If the patient has a fever, what is the last reading?  (use n/a if patient denies fever)  n/a . IF THE PATIENT STATES THEY DO NOT OWN A THERMOMETER, THEY MUST GO AND PURCHASE ONE When did the fever start?: n/a Have you taken any medication to suppress a fever (ie Ibuprofen, Aleve, Tylenol)?: n/a  Patient currently teaching online classes.  Patient asked for telephone visit with Dr. Lamonte Sakai. Scheduled for 8/25 at 11:15 with Dr. Lamonte Sakai. Nothing further needed at this time.

## 2019-06-17 ENCOUNTER — Other Ambulatory Visit: Payer: Self-pay

## 2019-06-17 ENCOUNTER — Encounter: Payer: Self-pay | Admitting: Emergency Medicine

## 2019-06-17 ENCOUNTER — Ambulatory Visit (INDEPENDENT_AMBULATORY_CARE_PROVIDER_SITE_OTHER): Payer: BC Managed Care – PPO | Admitting: Emergency Medicine

## 2019-06-17 DIAGNOSIS — D869 Sarcoidosis, unspecified: Secondary | ICD-10-CM | POA: Diagnosis not present

## 2019-06-17 NOTE — Assessment & Plan Note (Addendum)
Her cough, dyspnea, throat irritation, chest discomfort had all improved with the initiation of prednisone 30 mg daily.  She had a setback recently when she tried to push herself, went shopping at [place] and did a lot of work, more than she usually does.  She was dyspneic, has had some chest and back discomfort ever since.  She did not have SABA, needs 1 and we will write a prescription.  She feels that she is probably benefiting from the Symbicort and we will continue.  I am hopeful that she will keep the benefits that we have achieved with the prednisone even when we taper off over the next couple weeks.  We will plan timing of any repeat imaging when I see her in the office.  Talked her today about the fact that she may have to slowly build her exertional stamina back up.  She might benefit from pulmonary rehab. Continue loratadine.

## 2019-06-17 NOTE — Progress Notes (Signed)
Virtual Visit via Telephone Note  I connected with Holly [name] on 06/17/19 at 11:15 AM EDT by telephone and verified that I am speaking with the correct person using two identifiers.  Location: Patient: Work Provider: Office   I discussed the limitations, risks, security and privacy concerns of performing an evaluation and management service by telephone and the availability of in person appointments. I also discussed with the patient that there may be a patient responsible charge related to this service. The patient expressed understanding and agreed to proceed.   History of Present Illness: 53 year old woman with sarcoidosis with micronodular disease, lymphadenopathy and obstruction on pulmonary function testing.  I saw her 05/26/2019 with symptoms consistent with an acute exacerbation.  Started on scheduled prednisone for 3 weeks with plans to follow-up today.  Also started her on Symbicort to see if she would get benefit, loratadine.   Observations/Objective: She reports that she was feeling better initially after prednisone initiated. Cough and hoarseness. Remains on pred 30 mg daily for a few more days and then taper.  She went shopping 3 days ago, had a lot of dyspnea. Chest and back discomfort. She didn't have a SABA to use. Still not quite back to the baseline she has established over the last month  Assessment and Plan: Sarcoidosis Her cough, dyspnea, throat irritation, chest discomfort had all improved with the initiation of prednisone 30 mg daily.  She had a setback recently when she tried to push herself, went shopping at [place] and did a lot of work, more than she usually does.  She was dyspneic, has had some chest and back discomfort ever since.  She did not have SABA, needs 1 and we will write a prescription.  She feels that she is probably benefiting from the Symbicort and we will continue.  I am hopeful that she will keep the benefits that we have achieved with the prednisone  even when we taper off over the next couple weeks.  We will plan timing of any repeat imaging when I see her in the office.  Talked her today about the fact that she may have to slowly build her exertional stamina back up.  She might benefit from pulmonary rehab. Continue loratadine.     Follow Up Instructions:    I discussed the assessment and treatment plan with the patient. The patient was provided an opportunity to ask questions and all were answered. The patient agreed with the plan and demonstrated an understanding of the instructions.   The patient was advised to call back or seek an in-person evaluation if the symptoms worsen or if the condition fails to improve as anticipated.  I provided 20 minutes of non-face-to-face time during this encounter.   Collene Gobble, MD

## 2019-06-24 ENCOUNTER — Ambulatory Visit: Payer: BC Managed Care – PPO | Admitting: Emergency Medicine

## 2019-07-03 ENCOUNTER — Telehealth: Payer: Self-pay | Admitting: Emergency Medicine

## 2019-07-03 MED ORDER — ALBUTEROL SULFATE HFA 108 (90 BASE) MCG/ACT IN AERS: 2.0000 | INHALATION_SPRAY | Freq: Four times a day (QID) | RESPIRATORY_TRACT | 2 refills | Status: DC | PRN

## 2019-07-03 NOTE — Telephone Encounter (Signed)
ATC pt, no answer. Left message for pt to call back.  I sent in the albuterol Rx to [facility]  Assessment & Plan Note by Collene Gobble, MD at 06/17/2019 11:24 AM Author: Collene Gobble, MD Author Type: Physician Filed: 06/17/2019 11:27 AM  Note Status: Bernell List: Cosign Not Required Encounter Date: 06/17/2019  Problem: Sarcoidosis  Editor: Collene Gobble, MD (Physician)  Prior Versions: 1. Collene Gobble, MD (Physician) at 06/17/2019 11:26 AM - Written    Her cough, dyspnea, throat irritation, chest discomfort had all improved with the initiation of prednisone 30 mg daily.  She had a setback recently when she tried to push herself, went shopping at [place] and did a lot of work, more than she usually does.  She was dyspneic, has had some chest and back discomfort ever since.  She did not have SABA, needs 1 and we will write a prescription.  She feels that she is probably benefiting from the Symbicort and we will continue.  I am hopeful that she will keep the benefits that we have achieved with the prednisone even when we taper off over the next couple weeks.  We will plan timing of any repeat imaging when I see her in the office.  Talked her today about the fact that she may have to slowly build her exertional stamina back up.  She might benefit from pulmonary rehab. Continue loratadine.

## 2019-07-07 NOTE — Telephone Encounter (Signed)
I have left a message on the pt's voicemail making her aware that this has been taken care of. Nothing further was needed at this time.

## 2019-07-08 ENCOUNTER — Ambulatory Visit: Payer: BC Managed Care – PPO | Admitting: Emergency Medicine

## 2019-10-04 ENCOUNTER — Emergency Department
Admission: EM | Admit: 2019-10-04 | Discharge: 2019-10-04 | Disposition: A | Discharge status: Home / Self Care | Payer: BC Managed Care – PPO | Attending: Emergency Medicine | Admitting: Emergency Medicine

## 2019-10-04 ENCOUNTER — Emergency Department: Payer: BC Managed Care – PPO

## 2019-10-04 ENCOUNTER — Other Ambulatory Visit: Payer: Self-pay

## 2019-10-04 ENCOUNTER — Encounter: Payer: Self-pay | Admitting: Emergency Medicine

## 2019-10-04 DIAGNOSIS — Z79899 Other long term (current) drug therapy: Secondary | ICD-10-CM | POA: Diagnosis not present

## 2019-10-04 DIAGNOSIS — R519 Headache, unspecified: Secondary | ICD-10-CM | POA: Insufficient documentation

## 2019-10-04 DIAGNOSIS — Y92410 Unspecified street and highway as the place of occurrence of the external cause: Secondary | ICD-10-CM | POA: Insufficient documentation

## 2019-10-04 DIAGNOSIS — E119 Type 2 diabetes mellitus without complications: Secondary | ICD-10-CM | POA: Diagnosis not present

## 2019-10-04 DIAGNOSIS — M542 Cervicalgia: Secondary | ICD-10-CM | POA: Insufficient documentation

## 2019-10-04 DIAGNOSIS — Y999 Unspecified external cause status: Secondary | ICD-10-CM | POA: Insufficient documentation

## 2019-10-04 DIAGNOSIS — M545 Low back pain, unspecified: Secondary | ICD-10-CM

## 2019-10-04 DIAGNOSIS — Z7984 Long term (current) use of oral hypoglycemic drugs: Secondary | ICD-10-CM | POA: Diagnosis not present

## 2019-10-04 DIAGNOSIS — I10 Essential (primary) hypertension: Secondary | ICD-10-CM | POA: Insufficient documentation

## 2019-10-04 DIAGNOSIS — Y93I9 Activity, other involving external motion: Secondary | ICD-10-CM | POA: Diagnosis not present

## 2019-10-04 MED ORDER — CYCLOBENZAPRINE HCL 5 MG PO TABS: ORAL_TABLET | ORAL | 0 refills | Status: DC

## 2019-10-04 MED ORDER — IBUPROFEN 600 MG PO TABS: 600.0000 mg | ORAL_TABLET | Freq: Four times a day (QID) | ORAL | 0 refills | Status: DC | PRN

## 2019-10-04 MED ORDER — LIDOCAINE 5 % EX PTCH: 1.0000 | MEDICATED_PATCH | CUTANEOUS | 0 refills | Status: DC
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
## 2019-10-04 NOTE — ED Notes (Signed)
First Nurse Note: Pt to ED via [facility] from [place]. Pt wanting to be checked out. PT is in NAD.
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
## 2019-10-04 NOTE — Discharge Instructions (Signed)
Your CT scans and your x-ray do not show any fractures.  I have given you a prescription for Flexeril to help relax your muscles and ibuprofen for pain and inflammation.  Do not work or drive after taking the Flexeril.  You will likely be more sore the next couple of days, this is normal.  Please call primary care on Monday for a follow-up appointment next week.

## 2019-12-30 ENCOUNTER — Other Ambulatory Visit: Payer: Self-pay

## 2019-12-30 ENCOUNTER — Encounter: Payer: Self-pay | Admitting: Pulmonary Disease

## 2019-12-30 ENCOUNTER — Ambulatory Visit (INDEPENDENT_AMBULATORY_CARE_PROVIDER_SITE_OTHER): Payer: BC Managed Care – PPO | Admitting: Pulmonary Disease

## 2019-12-30 DIAGNOSIS — D869 Sarcoidosis, unspecified: Secondary | ICD-10-CM | POA: Diagnosis not present

## 2019-12-30 DIAGNOSIS — U071 COVID-19: Secondary | ICD-10-CM

## 2019-12-30 DIAGNOSIS — J301 Allergic rhinitis due to pollen: Secondary | ICD-10-CM | POA: Diagnosis not present

## 2019-12-30 HISTORY — DX: COVID-19: U07.1

## 2019-12-30 NOTE — Patient Instructions (Signed)
You were seen today by Coral Ceo, NP  for:   Thank you for completing the televisit with our office today.  I am glad that you called.  I do believe we need an in person evaluation to further assess your symptoms.  Please present to our office on 12/31/2019 with a chest x-ray as well as a walk in office.  Nice meeting you,   Arlys John    1. COVID-19 virus detected  - DG Chest 2 View; Future  2. Sarcoidosis  - DG Chest 2 View; Future  3. Allergic rhinitis due to pollen, unspecified seasonality    We recommend today:  Orders Placed This Encounter  Procedures  . DG Chest 2 View    Standing Status:   Future    Standing Expiration Date:   02/28/2021    Order Specific Question:   Reason for Exam (SYMPTOM  OR DIAGNOSIS REQUIRED)    Answer:   short of breath    Order Specific Question:   Preferred imaging location?    Answer:   Internal    Order Specific Question:   Radiology Contrast Protocol - do NOT remove file path    Answer:   [email or web address]   Orders Placed This Encounter  Procedures  . DG Chest 2 View   No orders of the defined types were placed in this encounter.   Follow Up:    Return in about 1 day (around 12/31/2019), or if symptoms worsen or fail to improve, for Follow up with Elisha Headland FNP-C, With Chest Xray, With walk in office.   Please do your part to reduce the spread of COVID-19:      Reduce your risk of any infection  and COVID19 by using the similar precautions used for avoiding the common cold or flu:  Marland Kitchen Wash your hands often with soap and warm water for at least 20 seconds.  If soap and water are not readily available, use an alcohol-based hand sanitizer with at least 60% alcohol.  . If coughing or sneezing, cover your mouth and nose by coughing or sneezing into the elbow areas of your shirt or coat, into a tissue or into your sleeve (not your hands). Drinda Butts A MASK when in public  . Avoid shaking hands with  others and consider head nods or verbal greetings only. . Avoid touching your eyes, nose, or mouth with unwashed hands.  . Avoid close contact with people who are sick. . Avoid places or events with large numbers of people in one location, like concerts or sporting events. . If you have some symptoms but not all symptoms, continue to monitor at home and seek medical attention if your symptoms worsen. . If you are having a medical emergency, call 911.   ADDITIONAL HEALTHCARE OPTIONS FOR PATIENTS  [place] [facility] / e-Visit: [email or web address]         [facility]: [phone]  [facility]: [phone]                   [facility]: [phone]     It is flu season:   >>> Best ways to protect herself from the flu: Receive the yearly flu vaccine, practice good hand hygiene washing with soap and also using hand sanitizer when available, eat a nutritious meals, get adequate rest, hydrate appropriately   Please contact the office if your symptoms worsen or you have concerns that you are not improving.   Thank you for  choosing [facility] for your healthcare, and for allowing [place] to partner with you on your healthcare journey. I am thankful to be able to provide care to you today.   Wyn Quaker FNP-C

## 2019-12-30 NOTE — Progress Notes (Signed)
Virtual Visit via Telephone Note  I connected with Holly [name] on 12/30/19 at  3:00 PM EST by telephone and verified that I am speaking with the correct person using two identifiers.  Location: Patient: Home Provider: [facility] - [address], [place], [place], [place] [address]   I discussed the limitations, risks, security and privacy concerns of performing an evaluation and management service by telephone and the availability of in person appointments. I also discussed with the patient that there may be a patient responsible charge related to this service. The patient expressed understanding and agreed to proceed.  Patient consented to consult via telephone: Yes People present and their role in pt care: Pt     History of Present Illness:  54 year old female never smoker followed in our office for sarcoidosis  Past medical history: Type 2 diabetes, depression, anxiety, hypertension, status post COVID-19 infection Smoking history: Never smoker Maintenance: Symbicort 160 Patient Dr. Lamonte Sakai  Chief complaint: Status post COVID-19 infection in January/9343  54 year old female never smoker followed in our office for sarcoidosis.  She is a patient of Dr. Lamonte Sakai.  Last seen in August/2020.  Patient tested positive for SARS-CoV-2 in January/2021.  At last office visit in August/2020 it was recommended that she be started on prednisone for 3 weeks as well as Symbicort to see if she benefits.  She was recommended also start on a daily antihistamine.  She completed a 1 month follow-up reporting clinical benefit while on steroids as well as Symbicort 160.  The hope was to be able to titrate the patient's prednisone down over the following couple weeks.  Was also recommended that she have repeat CT imaging for her follow-up with Dr. Lamonte Sakai.  Patient reporting today via telephone that she had a Covid shot last Monday on 12/22/2019.  She had fever as well as increased fatigue after  getting the Covid shot.  Fever started on 12/24/2019 and lasted till 12/26/2019.  T-max of 100.5.  Increased fatigue.  She also having a sore throat and headache.  She is reporting intermittent pains in the back of her chest.  Shortness of breath has progressively worsened over the last past couple of months but acutely worsened over the last 1 to 2 weeks.  Patient reports that her breathing was stable and she was still able to work a few months ago by using her rescue inhaler 2-3 times daily.  She has been off of her Symbicort since December/2020.  She has been off of prednisone since October/2020.  She last had a follow-up with our office in August/2020.  She no longer finds symptomatic relief by using her rescue inhaler.  Observations/Objective:  10/30/2019-SARS-CoV-2-positive  05/05/2019-ACE level-48  04/18/2019-chest x-ray-no demonstrated complication from procedure, grossly stable mediastinal adenopathy  05/26/2019-pulmonary function test-FVC 3.51 (80% predicted), postbronchodilator ratio 74, postbronchodilator FEV1 2.59 (75% predicted), no bronchodilator response, DLCO 24.86 (96% predicted)  Social History   Tobacco Use  Smoking Status Never Smoker  Smokeless Tobacco Never Used   Immunization History  Administered Date(s) Administered  . Influenza,inj,Quad PF,6+ Mos 07/18/2018  . Influenza-Unspecified 07/11/2016, 07/24/2017, 09/03/2018      Assessment and Plan:  Discussion: Patient with history of sarcoidosis and also recent Covid infection in January/2021. Patient reports worsening shortness of breath over the last 2 to 3 months but have worsened acutely since COVID-19 vaccine last week.  Suspect patient is likely in a sarcoid flare but is difficult to fully evaluate during a telephone visit.  We  will need to have the patient come into our office on 12/31/2019 for a chest x-ray, walk in office as well as likely lab work.  Emphasized to the patient that if symptoms worsen she would need to  present to the emergency room for further evaluation.  She agrees.  COVID-19 virus detected COVID-19 infection in January/2021 Did not require hospitalization No longer infectious  Plan: Present to office tomorrow 12/31/2019 for further evaluation with chest x-ray and walk Chest x-ray ordered  Sarcoidosis History of sarcoidosis Last seen in August/2020 by Dr. Delton Coombes where she was encouraged to remain on Symbicort 160 as well as a taper of prednisone Stop taking Symbicort 160 in December/2020 Stop taking prednisone and October/2020 She has not had follow-up with our office since Patient reporting 2 to 3 weeks of increased dyspnea, fatigue, shortness of breath Status post COVID-19 infection  Plan: Patient needs in person evaluation to further evaluate Patient presents her office on 12/31/2019 with a chest x-ray and walk in office Likely will need lab work     Follow Up Instructions:  Return in about 1 day (around 12/31/2019), or if symptoms worsen or fail to improve, for Follow up with Elisha Headland FNP-C, With Chest Xray, With walk in office.   I discussed the assessment and treatment plan with the patient. The patient was provided an opportunity to ask questions and all were answered. The patient agreed with the plan and demonstrated an understanding of the instructions.   The patient was advised to call back or seek an in-person evaluation if the symptoms worsen or if the condition fails to improve as anticipated.  I provided 24 minutes of non-face-to-face time during this encounter.   Coral Ceo, NP

## 2019-12-30 NOTE — Assessment & Plan Note (Signed)
COVID-19 infection in January/2021 Did not require hospitalization No longer infectious  Plan: Present to office tomorrow 12/31/2019 for further evaluation with chest x-ray and walk Chest x-ray ordered

## 2019-12-30 NOTE — Progress Notes (Signed)
@Patient  ID: , female    DOB: 04/21/1966, 53 y.o.   MRN: [id number]  Chief Complaint  Patient presents with  . Follow-up    1 day f/u. Ran a fever last night of 99.66F. No fever this morning. Increased SOB with simple tasks such as talking.     Referring provider: No ref. provider found  HPI:  54 year old female never smoker followed in our office for sarcoidosis  Past medical history: Type 2 diabetes, depression, anxiety, hypertension, status post COVID-19 infection Smoking history: Never smoker Maintenance: ?Symbicort 160 Patient Dr. 40  12/31/2019  - Visit   54 year old female never smoker followed in our office for sarcoidosis.  She was last seen in our office in August/2020.  Since then patient has contracted Covid in January/2021.  Patient contacted our office to report that she had worsened shortness of breath as well as mildly elevated temperatures recently.  Last night had a temperature of 99.9.  Patient presented to our office today to receive a chest x-ray as well as an in person physical evaluation.  Patient also was walked.  She was only able to complete 1 lap she had no oxygen desaturations but felt symptomatically dizzy so she stopped.  Patient reports that she has not noticed any new rashes.  She does report ongoing bandlike chest pain and back pain.  Occasional sharp rib pain about every 3 days.  She reports that she is getting annual eye exams.  She also continues to have bouts of dyspnea and occasional wheezing.  She has persistent worsening fatigue.  Denies any lower extremity swelling or leg pain.  Denies hemoptysis.  Heart rate is elevated today this is after being walked in our office.     Wells Criteria Modified Wells criteria: Clinical assessment for PE Clinical symptoms of DVT (leg swelling, pain with palpation) - 3 Other diagnoses less likely than pulmonary embolism - 3 Heart rate greater than 100 - 1.5 Immobilization (disease greater in 3  days) or surgery in the previous 4 weeks - 1.5 Previous DVT/PE - 1.5 Hemoptysis - 1 Malignancy - 1  Probability Traditional clinical probability assessment (Wells criteria) High - greater than 6 Moderate - 2 to 6 Low less than 2  Simplify clinical probability assessment (modified Wells criteria) PE likely-greater than 4 PE unlikely little less than or equal to 4    Questionaires / Pulmonary Flowsheets:   MMRC: mMRC Dyspnea Scale mMRC Score  12/31/2019 0    Tests:   10/30/2019-SARS-CoV-2-positive  05/05/2019-ACE level-48  04/18/2019-chest x-ray-no demonstrated complication from procedure, grossly stable mediastinal adenopathy  05/26/2019-pulmonary function test-FVC 3.51 (80% predicted), postbronchodilator ratio 74, postbronchodilator FEV1 2.59 (75% predicted), no bronchodilator response, DLCO 24.86 (96% predicted)  05/13/2019-high-resolution CT chest-multiple subtle central peribronchovascular nodularity nodularity along the fissures, a few scattered small pulmonary nodules measuring up to 4 mm in left lower lobe, pleural calcification and posterior right hemothorax, findings in keeping with diagnosis of sarcoid, enlarged pulmonary trunk indicative of PAH  FENO:  No results found for: NITRICOXIDE  PFT: PFT Results Latest Ref Rng & Units 05/26/2019  FVC-Pre L 3.51  FVC-Predicted Pre % 80  FVC-Post L 3.49  FVC-Predicted Post % 80  Pre FEV1/FVC % % 74  Post FEV1/FCV % % 74  FEV1-Pre L 2.60  FEV1-Predicted Pre % 76  FEV1-Post L 2.59  DLCO UNC% % 96  DLCO COR %Predicted % 123    WALK:  SIX MIN WALK 12/31/2019  Supplimental Oxygen during Test? (L/min) No  Tech Comments: Patient was able to complete 1 lap at a moderate pace. She had to stop at 3/4 of a lap due to feeling SOB and lightheaded. Her O2 was at 98%, HR 120 when she stopped. She was able to complete her lap. No O2 needed during or after walk. Patient felt better once she sat down.    Imaging: DG Chest 2  View  Result Date: 12/31/2019 CLINICAL DATA:  Shortness of breath, sarcoidosis EXAM: CHEST - 2 VIEW COMPARISON:  04/18/2019 FINDINGS: Heart is normal size. No confluent opacities or effusions. Mediastinal contours within normal limits. No acute bony abnormality. IMPRESSION: No active cardiopulmonary disease. Electronically Signed   By: Charlett Nose M.D.   On: 12/31/2019 09:53    Lab Results:  CBC    Component Value Date/Time   WBC 7.7 04/16/2019 1450   RBC 4.81 04/16/2019 1450   HGB 14.5 04/16/2019 1450   HCT 43.3 04/16/2019 1450   PLT 312 04/16/2019 1450   MCV 90.0 04/16/2019 1450   MCH 30.1 04/16/2019 1450   MCHC 33.5 04/16/2019 1450   RDW 12.1 04/16/2019 1450    BMET    Component Value Date/Time   NA 137 04/16/2019 1450   K 3.5 04/16/2019 1450   CL 101 04/16/2019 1450   CO2 25 04/16/2019 1450   GLUCOSE 146 (H) 04/16/2019 1450   BUN 11 04/16/2019 1450   CREATININE 0.78 04/16/2019 1450   CALCIUM 9.3 04/16/2019 1450   GFRNONAA >60 04/16/2019 1450   GFRAA >60 04/16/2019 1450    BNP No results found for: BNP  ProBNP No results found for: PROBNP  Specialty Problems      Pulmonary Problems   Hilar adenopathy    CTA CHEST and PET      Allergic rhinitis      Allergies  Allergen Reactions  . Avelox [Moxifloxacin Hcl In Nacl]   . Cymbalta [Duloxetine Hcl]   . Oxycodone-Acetaminophen     Immunization History  Administered Date(s) Administered  . Influenza,inj,Quad PF,6+ Mos 07/18/2018  . Influenza-Unspecified 07/11/2016, 07/24/2017, 09/03/2018    Past Medical History:  Diagnosis Date  . Anxiety   . Depression   . Diabetes mellitus without complication (HCC)   . Fatty liver   . Goiter   . Hashimoto's thyroiditis   . Hilar adenopathy 03/20/19  04/11/19   CTA CHEST and PET per DR. Anne Ng [facility]  . History of kidney stones   . Hypertension   . Mediastinal adenopathy    PER CTA CHEST PET SCAN per DR. Lanae Crumbly LEWIS  . Pneumonia   . Supraclavicular  adenopathy 03/20/2019   PER CTA CHEST and PET per DR. Sudie Bailey LE[facility]    Tobacco History: Social History   Tobacco Use  Smoking Status Never Smoker  Smokeless Tobacco Never Used   Counseling given: Yes   Continue to not smoke  Outpatient Encounter Medications as of 12/31/2019  Medication Sig  . albuterol (VENTOLIN HFA) 108 (90 Base) MCG/ACT inhaler Inhale 2 puffs into the lungs every 6 (six) hours as needed for wheezing or shortness of breath.  Marland Kitchen atenolol-chlorthalidone (TENORETIC) 50-25 MG tablet Take 1 tablet by mouth daily.   Marland Kitchen atorvastatin (LIPITOR) 40 MG tablet Take 40 mg by mouth daily at 6 PM.   . budesonide-formoterol (SYMBICORT) 160-4.5 MCG/ACT inhaler Inhale 2 puffs into the lungs 2 (two) times daily.  . cyclobenzaprine (FLEXERIL) 5 MG tablet Take 1-2 tablets 3 times daily as needed  . ibuprofen (ADVIL) 600 MG tablet Take  1 tablet (600 mg total) by mouth every 6 (six) hours as needed.  Marland Kitchen levothyroxine (SYNTHROID) 25 MCG tablet Take 25 mcg by mouth daily before breakfast.   . lidocaine (LIDODERM) 5 % Place 1 patch onto the skin daily. Remove & Discard patch within 12 hours or as directed by MD  . metFORMIN (GLUCOPHAGE-XR) 500 MG 24 hr tablet Take 1,000 mg by mouth 2 (two) times a day.   . traZODone (DESYREL) 100 MG tablet Take 100 mg by mouth at bedtime as needed for sleep.   Marland Kitchen venlafaxine XR (EFFEXOR-XR) 75 MG 24 hr capsule Take 300 mg by mouth daily. Takes 4 (75mg ) capsules daily=300mg   . [DISCONTINUED] budesonide-formoterol (SYMBICORT) 160-4.5 MCG/ACT inhaler Inhale 2 puffs into the lungs 2 (two) times daily.  . predniSONE (DELTASONE) 10 MG tablet Take 4 tablets (40 mg total) by mouth daily with breakfast for 3 days, THEN 3 tablets (30 mg total) daily with breakfast for 3 days, THEN 2 tablets (20 mg total) daily with breakfast for 3 days, THEN 1 tablet (10 mg total) daily with breakfast for 3 days.   No facility-administered encounter medications on file as of 12/31/2019.      Review of Systems  Review of Systems  Constitutional: Positive for activity change and fatigue. Negative for fever.  HENT: Positive for congestion, postnasal drip and rhinorrhea. Negative for sinus pressure, sinus pain and sore throat.   Respiratory: Positive for shortness of breath. Negative for cough and wheezing.   Cardiovascular: Positive for leg swelling. Negative for chest pain and palpitations.  Gastrointestinal: Negative for vomiting.  Musculoskeletal: Positive for back pain. Negative for arthralgias.  Neurological: Negative for dizziness.  Psychiatric/Behavioral: Negative for sleep disturbance. The patient is not nervous/anxious.      Physical Exam  BP 124/86   Pulse (!) 104   Temp 98.4 F (36.9 C) (Temporal)   Ht 5\' 11"  (1.803 m)   SpO2 100% Comment: on RA  BMI 40.45 kg/m   Wt Readings from Last 5 Encounters:  10/04/19 290 lb (131.5 kg)  05/26/19 289 lb (131.1 kg)  05/06/19 293 lb (132.9 kg)  05/05/19 294 lb (133.4 kg)  04/18/19 299 lb (135.6 kg)    BMI Readings from Last 5 Encounters:  12/31/19 40.45 kg/m  10/04/19 40.45 kg/m  05/26/19 40.88 kg/m  05/06/19 42.04 kg/m  05/05/19 41.59 kg/m     Physical Exam Vitals and nursing note reviewed.  Constitutional:      General: She is not in acute distress.    Appearance: Normal appearance. She is obese.  HENT:     Head: Normocephalic and atraumatic.     Right Ear: Tympanic membrane, ear canal and external ear normal. There is no impacted cerumen.     Left Ear: Tympanic membrane, ear canal and external ear normal. There is no impacted cerumen.     Ears:     Comments: Left TM with effusion    Nose: Mucosal edema, congestion and rhinorrhea present.     Mouth/Throat:     Mouth: Mucous membranes are moist.     Pharynx: Oropharynx is clear.     Comments: Postnasal drip Eyes:     Pupils: Pupils are equal, round, and reactive to light.  Cardiovascular:     Rate and Rhythm: Normal rate and regular  rhythm.     Pulses: Normal pulses.     Heart sounds: Normal heart sounds. No murmur.  Pulmonary:     Effort: Pulmonary effort is normal. No respiratory  distress.     Breath sounds: No decreased air movement. No decreased breath sounds, wheezing or rales.  Musculoskeletal:     Cervical back: Normal range of motion.  Lymphadenopathy:     Head:     Right side of head: Submandibular adenopathy present.  Skin:    General: Skin is warm and dry.     Capillary Refill: Capillary refill takes less than 2 seconds.  Neurological:     General: No focal deficit present.     Mental Status: She is alert and oriented to person, place, and time. Mental status is at baseline.     Gait: Gait normal.  Psychiatric:        Mood and Affect: Mood normal.        Behavior: Behavior normal.        Thought Content: Thought content normal.        Judgment: Judgment normal.       Assessment & Plan:   Allergic rhinitis Rhinorrhea, nasal mucosal edema, postnasal drip on exam today Patient admits to not taking daily and histamines regularly Left TM with effusion, clear does not appear to be infected  Plan: Resume daily antihistamine Can take Benadryl at night or Chlortab's May benefit from nasal saline rinses in the future  COVID-19 virus detected January/2021 SARS-CoV-2 positive No longer infectious Did not require hospitalization Chest x-ray today clear  Plan: Continue to monitor clinically  Sarcoidosis History of sarcoidosis Last seen in August/2020 by Dr. Delton Coombes where she was encouraged to remain on Symbicort 160 as well as a taper of prednisone Stop taking Symbicort 160 in December/2020 Stop taking prednisone and October/2020 She has not had follow-up with our office since Patient reporting 2 to 3 weeks of increased dyspnea, fatigue, shortness of breath Status post COVID-19 infection Chest x-ray today stable  Plan: Lab work today Prednisone taper today Restart Symbicort 160 4-week  follow-up with our office       Return in about 4 weeks (around 01/28/2020), or if symptoms worsen or fail to improve, for Follow up with Dr. Delton Coombes.   Coral Ceo, NP 12/31/2019   This appointment required 32 minutes of patient care (this includes precharting, chart review, review of results, face-to-face care, etc.).

## 2019-12-30 NOTE — Assessment & Plan Note (Signed)
History of sarcoidosis Last seen in August/2020 by Dr. Delton Coombes where she was encouraged to remain on Symbicort 160 as well as a taper of prednisone Stop taking Symbicort 160 in December/2020 Stop taking prednisone and October/2020 She has not had follow-up with our office since Patient reporting 2 to 3 weeks of increased dyspnea, fatigue, shortness of breath Status post COVID-19 infection  Plan: Patient needs in person evaluation to further evaluate Patient presents her office on 12/31/2019 with a chest x-ray and walk in office Likely will need lab work

## 2019-12-31 ENCOUNTER — Ambulatory Visit: Payer: BC Managed Care – PPO | Admitting: Acute Care

## 2019-12-31 ENCOUNTER — Ambulatory Visit (INDEPENDENT_AMBULATORY_CARE_PROVIDER_SITE_OTHER): Payer: BC Managed Care – PPO

## 2019-12-31 ENCOUNTER — Ambulatory Visit: Payer: BC Managed Care – PPO | Admitting: Pulmonary Disease

## 2019-12-31 ENCOUNTER — Other Ambulatory Visit: Payer: Self-pay

## 2019-12-31 ENCOUNTER — Telehealth: Payer: Self-pay | Admitting: Pulmonary Disease

## 2019-12-31 ENCOUNTER — Encounter: Payer: Self-pay | Admitting: Pulmonary Disease

## 2019-12-31 VITALS — BP 124/86 | HR 104 | Temp 98.4°F | Ht 71.0 in

## 2019-12-31 DIAGNOSIS — D869 Sarcoidosis, unspecified: Secondary | ICD-10-CM

## 2019-12-31 DIAGNOSIS — U071 COVID-19: Secondary | ICD-10-CM

## 2019-12-31 DIAGNOSIS — J301 Allergic rhinitis due to pollen: Secondary | ICD-10-CM

## 2019-12-31 LAB — CBC WITH DIFFERENTIAL/PLATELET
Basophils Absolute: 0 10*3/uL (ref 0.0–0.1)
Basophils Relative: 0.5 % (ref 0.0–3.0)
Eosinophils Absolute: 0.1 10*3/uL (ref 0.0–0.7)
Eosinophils Relative: 1.9 % (ref 0.0–5.0)
HCT: 36.7 % (ref 36.0–46.0)
Hemoglobin: 12.6 g/dL (ref 12.0–15.0)
Lymphocytes Relative: 32.9 % (ref 12.0–46.0)
Lymphs Abs: 2 10*3/uL (ref 0.7–4.0)
MCHC: 34.3 g/dL (ref 30.0–36.0)
MCV: 90.2 fl (ref 78.0–100.0)
Monocytes Absolute: 0.4 10*3/uL (ref 0.1–1.0)
Monocytes Relative: 6.3 % (ref 3.0–12.0)
Neutro Abs: 3.6 10*3/uL (ref 1.4–7.7)
Neutrophils Relative %: 58.4 % (ref 43.0–77.0)
Platelets: 246 10*3/uL (ref 150.0–400.0)
RBC: 4.07 Mil/uL (ref 3.87–5.11)
RDW: 14.6 % (ref 11.5–15.5)
WBC: 6.2 10*3/uL (ref 4.0–10.5)

## 2019-12-31 LAB — COMPREHENSIVE METABOLIC PANEL
ALT: 57 U/L — ABNORMAL HIGH (ref 0–35)
AST: 37 U/L (ref 0–37)
Albumin: 3.6 g/dL (ref 3.5–5.2)
Alkaline Phosphatase: 100 U/L (ref 39–117)
BUN: 14 mg/dL (ref 6–23)
CO2: 32 mEq/L (ref 19–32)
Calcium: 9.3 mg/dL (ref 8.4–10.5)
Chloride: 93 mEq/L — ABNORMAL LOW (ref 96–112)
Creatinine, Ser: 0.83 mg/dL (ref 0.40–1.20)
GFR: 71.87 mL/min (ref >60.00)
Glucose, Bld: 314 mg/dL — ABNORMAL HIGH (ref 70–99)
Potassium: 3.5 mEq/L (ref 3.5–5.1)
Sodium: 133 mEq/L — ABNORMAL LOW (ref 135–145)
Total Bilirubin: 1.1 mg/dL (ref 0.2–1.2)
Total Protein: 7.4 g/dL (ref 6.0–8.3)

## 2019-12-31 LAB — BRAIN NATRIURETIC PEPTIDE: Pro B Natriuretic peptide (BNP): 39 pg/mL (ref 0.0–100.0)

## 2019-12-31 MED ORDER — BUDESONIDE-FORMOTEROL FUMARATE 160-4.5 MCG/ACT IN AERO: 2.0000 | INHALATION_SPRAY | Freq: Two times a day (BID) | RESPIRATORY_TRACT | 5 refills | Status: DC

## 2019-12-31 MED ORDER — PREDNISONE 10 MG PO TABS: ORAL_TABLET | ORAL | 0 refills | Status: AC

## 2019-12-31 NOTE — Telephone Encounter (Signed)
I called and spoke with the patient to verify she just needed a general work note and she states yes. The fax number is to her employer. I advised her that I will generate the letter and fax it to her employer. I will send this to Elisha Headland as an [place].

## 2019-12-31 NOTE — Progress Notes (Signed)
Discussed results with patient in office.  Nothing further is needed at this time.  Emlyn Maves FNP  

## 2019-12-31 NOTE — Patient Instructions (Addendum)
You were seen today by Lauraine Rinne, NP  for:   1. Sarcoidosis  - predniSONE (DELTASONE) 10 MG tablet; Take 4 tablets (40 mg total) by mouth daily with breakfast for 3 days, THEN 3 tablets (30 mg total) daily with breakfast for 3 days, THEN 2 tablets (20 mg total) daily with breakfast for 3 days, THEN 1 tablet (10 mg total) daily with breakfast for 3 days.  Dispense: 30 tablet; Refill: 0 - Brain natriuretic peptide; Future - Comp Met (CMET); Future - CBC with Differential/Platelet; Future  Prednisone taper today  Lab work today  We will restart you on:  Continue Symbicort 160 >>> 2 puffs in the morning right when you wake up, rinse out your mouth after use, 12 hours later 2 puffs, rinse after use >>> Take this daily, no matter what >>> This is not a rescue inhaler    2. COVID-19 virus detected  Chest x-ray stable today  Walk today in office stable  3. Allergic rhinitis due to pollen, unspecified seasonality  Please start taking a daily antihistamine:  >>>choose one of: zyrtec, claritin, allegra, or xyzal  >>>these are over the counter medications  >>>can choose generic option  >>>take daily  >>>this medication helps with allergies, post nasal drip, and cough     We recommend today:  Orders Placed This Encounter  Procedures  . Brain natriuretic peptide    Standing Status:   Future    Standing Expiration Date:   12/30/2020  . Comp Met (CMET)    Standing Status:   Future    Standing Expiration Date:   12/30/2020  . CBC with Differential/Platelet    Standing Status:   Future    Standing Expiration Date:   12/30/2020   Orders Placed This Encounter  Procedures  . Brain natriuretic peptide  . Comp Met (CMET)  . CBC with Differential/Platelet   Meds ordered this encounter  Medications  . predniSONE (DELTASONE) 10 MG tablet    Sig: Take 4 tablets (40 mg total) by mouth daily with breakfast for 3 days, THEN 3 tablets (30 mg total) daily with breakfast for 3 days, THEN 2  tablets (20 mg total) daily with breakfast for 3 days, THEN 1 tablet (10 mg total) daily with breakfast for 3 days.    Dispense:  30 tablet    Refill:  0    Follow Up:    Return in about 4 weeks (around 01/28/2020), or if symptoms worsen or fail to improve, for Follow up with Dr. Lamonte Sakai.   Please do your part to reduce the spread of COVID-19:      Reduce your risk of any infection  and COVID19 by using the similar precautions used for avoiding the common cold or flu:  Marland Kitchen Wash your hands often with soap and warm water for at least 20 seconds.  If soap and water are not readily available, use an alcohol-based hand sanitizer with at least 60% alcohol.  . If coughing or sneezing, cover your mouth and nose by coughing or sneezing into the elbow areas of your shirt or coat, into a tissue or into your sleeve (not your hands). Langley Gauss A MASK when in public  . Avoid shaking hands with others and consider head nods or verbal greetings only. . Avoid touching your eyes, nose, or mouth with unwashed hands.  . Avoid close contact with people who are sick. . Avoid places or events with large numbers of people in one location, like concerts  or sporting events. . If you have some symptoms but not all symptoms, continue to monitor at home and seek medical attention if your symptoms worsen. . If you are having a medical emergency, call 911.   [place] / e-Visit: [email or web address]         [facility]: [facility]: [phone]                   [facility]: [phone]     It is flu season:   >>> Best ways to protect herself from the flu: Receive the yearly flu vaccine, practice good hand hygiene washing with soap and also using hand sanitizer when available, eat a nutritious meals, get adequate rest, hydrate appropriately   Please contact the  office if your symptoms worsen or you have concerns that you are not improving.   Thank you for choosing [facility] for your healthcare, and for allowing [place] to partner with you on your healthcare journey. I am thankful to be able to provide care to you today.   Wyn Quaker FNP-C

## 2019-12-31 NOTE — Assessment & Plan Note (Signed)
January/2021 SARS-CoV-2 positive No longer infectious Did not require hospitalization Chest x-ray today clear  Plan: Continue to monitor clinically

## 2019-12-31 NOTE — Telephone Encounter (Signed)
Sounds good thank you   Arlys John

## 2019-12-31 NOTE — Assessment & Plan Note (Signed)
History of sarcoidosis Last seen in August/2020 by Dr. Delton Coombes where she was encouraged to remain on Symbicort 160 as well as a taper of prednisone Stop taking Symbicort 160 in December/2020 Stop taking prednisone and October/2020 She has not had follow-up with our office since Patient reporting 2 to 3 weeks of increased dyspnea, fatigue, shortness of breath Status post COVID-19 infection Chest x-ray today stable  Plan: Lab work today Prednisone taper today Restart Symbicort 160 4-week follow-up with our office

## 2019-12-31 NOTE — Assessment & Plan Note (Signed)
Rhinorrhea, nasal mucosal edema, postnasal drip on exam today Patient admits to not taking daily and histamines regularly Left TM with effusion, clear does not appear to be infected  Plan: Resume daily antihistamine Can take Benadryl at night or Chlortab's May benefit from nasal saline rinses in the future

## 2020-01-17 ENCOUNTER — Other Ambulatory Visit: Payer: Self-pay | Admitting: Family Medicine

## 2020-01-18 ENCOUNTER — Other Ambulatory Visit: Payer: Self-pay | Admitting: Family Medicine

## 2020-01-28 ENCOUNTER — Ambulatory Visit (INDEPENDENT_AMBULATORY_CARE_PROVIDER_SITE_OTHER): Payer: BC Managed Care – PPO | Admitting: Emergency Medicine

## 2020-01-28 ENCOUNTER — Other Ambulatory Visit: Payer: Self-pay

## 2020-01-28 ENCOUNTER — Encounter: Payer: Self-pay | Admitting: Emergency Medicine

## 2020-01-28 DIAGNOSIS — D869 Sarcoidosis, unspecified: Secondary | ICD-10-CM

## 2020-01-28 DIAGNOSIS — R059 Cough, unspecified: Secondary | ICD-10-CM | POA: Insufficient documentation

## 2020-01-28 DIAGNOSIS — E119 Type 2 diabetes mellitus without complications: Secondary | ICD-10-CM | POA: Diagnosis not present

## 2020-01-28 DIAGNOSIS — R05 Cough: Secondary | ICD-10-CM

## 2020-01-28 MED ORDER — IRBESARTAN 150 MG PO TABS: 150.0000 mg | ORAL_TABLET | Freq: Every day | ORAL | 12 refills | Status: DC

## 2020-01-28 NOTE — Progress Notes (Signed)
Virtual Visit via Telephone Note  I connected with Holly [name] on 01/28/20 at  9:30 AM EDT by telephone and verified that I am speaking with the correct person using two identifiers.  Location: Patient: Home Provider: Office   I discussed the limitations, risks, security and privacy concerns of performing an evaluation and management service by telephone and the availability of in person appointments. I also discussed with the patient that there may be a patient responsible charge related to this service. The patient expressed understanding and agreed to proceed.   History of Present Illness: 54 year old woman with sarcoidosis, micronodular disease and obstructive disease managed on Symbicort, albuterol.  .  I treated her for presumed flare of her sarcoidosis in July 2020.  We never repeated her CT imaging. Also with chronic cough on loratadine for allergic rhinitis.  She unfortunately had COVID-19 in January 2021.  She was seen here a month ago by B Cherre Huger, had a clear chest x-ray at that time.  She underwent a prednisone taper because she was having some flaring symptoms of dyspnea, fatigue.  Now off prednisone.    Observations/Objective: She took the prednisone taper given a month ago >> she benefited some: less exertional SOB, albe to do some activities. Still limited with walking, has to stop to rest. She has some cough, prod of clear. She is on lisinopril. She remains on Symbicort, still benefits. Uses albuterol about 2x a week. She has a hoarse voice - has been using xyzal.   Assessment and Plan: Sarcoidosis I suspect that her dyspnea and many of her symptoms are related to her sarcoid.  She needs a high-resolution CT scan of the chest to compare with her prior in July 2020.  She improved partially with a short course of steroids, may need a more prolonged course and then follow-up for resolution of infiltrates.  Cough Certainly this may relate also to her sarcoidosis and associated  obstructive lung disease.  Consider contributions of rhinitis for which she just started Xyzal.  Also she is on an ACE inhibitor and I think we should change that for now to eliminate it as a contributor.  DC lisinopril and start irbesartan 150 daily.    Follow Up Instructions: OV after Ct chest to review   I discussed the assessment and treatment plan with the patient. The patient was provided an opportunity to ask questions and all were answered. The patient agreed with the plan and demonstrated an understanding of the instructions.   The patient was advised to call back or seek an in-person evaluation if the symptoms worsen or if the condition fails to improve as anticipated.  I provided 18 minutes of non-face-to-face time during this encounter.   Leslye Peer, MD

## 2020-01-28 NOTE — Assessment & Plan Note (Signed)
Certainly this may relate also to her sarcoidosis and associated obstructive lung disease.  Consider contributions of rhinitis for which she just started Xyzal.  Also she is on an ACE inhibitor and I think we should change that for now to eliminate it as a contributor.  DC lisinopril and start irbesartan 150 daily.

## 2020-01-28 NOTE — Assessment & Plan Note (Signed)
I suspect that her dyspnea and many of her symptoms are related to her sarcoid.  She needs a high-resolution CT scan of the chest to compare with her prior in July 2020.  She improved partially with a short course of steroids, may need a more prolonged course and then follow-up for resolution of infiltrates.

## 2020-01-29 ENCOUNTER — Other Ambulatory Visit: Payer: Self-pay | Admitting: Family Medicine

## 2020-04-12 ENCOUNTER — Other Ambulatory Visit: Payer: Self-pay | Admitting: Family Medicine

## 2020-05-14 ENCOUNTER — Other Ambulatory Visit: Payer: Self-pay

## 2020-05-14 MED ORDER — LEVOTHYROXINE SODIUM 25 MCG PO TABS: 25.0000 ug | ORAL_TABLET | Freq: Every day | ORAL | 0 refills | Status: DC

## 2020-06-10 ENCOUNTER — Other Ambulatory Visit: Payer: Self-pay | Admitting: Family Medicine

## 2020-06-14 NOTE — Progress Notes (Signed)
NO SHOWED

## 2020-06-15 ENCOUNTER — Ambulatory Visit (INDEPENDENT_AMBULATORY_CARE_PROVIDER_SITE_OTHER): Payer: BC Managed Care – PPO | Admitting: Family Medicine

## 2020-06-15 DIAGNOSIS — E119 Type 2 diabetes mellitus without complications: Secondary | ICD-10-CM

## 2020-06-17 ENCOUNTER — Telehealth (INDEPENDENT_AMBULATORY_CARE_PROVIDER_SITE_OTHER): Payer: BC Managed Care – PPO | Admitting: Legal Medicine

## 2020-06-17 ENCOUNTER — Encounter: Payer: Self-pay | Admitting: Legal Medicine

## 2020-06-17 ENCOUNTER — Other Ambulatory Visit: Payer: Self-pay

## 2020-06-17 VITALS — Temp 97.0°F | Ht 70.5 in

## 2020-06-17 DIAGNOSIS — J0100 Acute maxillary sinusitis, unspecified: Secondary | ICD-10-CM | POA: Insufficient documentation

## 2020-06-17 DIAGNOSIS — J019 Acute sinusitis, unspecified: Secondary | ICD-10-CM | POA: Insufficient documentation

## 2020-06-17 HISTORY — DX: Acute maxillary sinusitis, unspecified: J01.00

## 2020-06-17 MED ORDER — PREDNISONE 10 MG PO TABS: 10.0000 mg | ORAL_TABLET | Freq: Every day | ORAL | 0 refills | Status: DC

## 2020-06-17 MED ORDER — AMOXICILLIN-POT CLAVULANATE 875-125 MG PO TABS: 1.0000 | ORAL_TABLET | Freq: Two times a day (BID) | ORAL | 0 refills | Status: DC

## 2020-06-17 NOTE — Progress Notes (Signed)
Virtual Visit via Telephone Note   This visit type was conducted due to national recommendations for restrictions regarding the COVID-19 Pandemic (e.g. social distancing) in an effort to limit this patient's exposure and mitigate transmission in our community.  Due to her co-morbid illnesses, this patient is at least at moderate risk for complications without adequate follow up.  This format is felt to be most appropriate for this patient at this time.  The patient did not have access to video technology/had technical difficulties with video requiring transitioning to audio format only (telephone).  All issues noted in this document were discussed and addressed.  No physical exam could be performed with this format.  Patient verbally consented to a telehealth visit.   Date:  06/17/2020   ID:  Holly [name], DOB [birth year], MRN [id number]  Patient Location: Home Provider Location: Office/Clinic  PCP:  Blane Ohara, MD   Evaluation Performed:  New Patient Evaluation  Chief Complaint:  Sick for 2 days negative covid,  History of Present Illness:    Holly [name] is a 54 y.o. female with sinus congestion, no fever, no myalgia.  The patient does not have symptoms concerning for COVID-19 infection (fever, chills, cough, or new shortness of breath).    Past Medical History:  Diagnosis Date  . Anxiety   . Depression   . Diabetes mellitus without complication (HCC)   . Fatty liver   . Goiter   . Hashimoto's thyroiditis   . Hilar adenopathy 03/20/19  04/11/19   CTA CHEST and PET per DR. Anne Ng LEWIS  . History of kidney stones   . Hypertension   . Mediastinal adenopathy    PER CTA CHEST PET SCAN per DR. Lanae Crumbly LEWIS  . Pneumonia   . Supraclavicular adenopathy 03/20/2019   PER CTA CHEST and PET per DR. Conchita Paris    Past Surgical History:  Procedure Laterality Date  . ABDOMINAL HYSTERECTOMY     COMPLETE  . ADHESIOLYSIS    . APPENDECTOMY    . CESAREAN SECTION    .  MEDIASTINOSCOPY N/A 04/18/2019   Procedure: MEDIASTINOSCOPY;  Surgeon: Delight Ovens, MD;  Location: [facility] OR;  Service: Thoracic;  Laterality: N/A;  . VIDEO BRONCHOSCOPY WITH ENDOBRONCHIAL ULTRASOUND N/A 04/18/2019   Procedure: VIDEO BRONCHOSCOPY WITH ENDOBRONCHIAL ULTRASOUND;  Surgeon: Delight Ovens, MD;  Location: [facility] OR;  Service: Thoracic;  Laterality: N/A;    Family History  Problem Relation Age of Onset  . Anuerysm Mother        BRAIN  . Cancer Mother        BREAST  . COPD Father   . Hyperlipidemia Father   . Congestive Heart Failure Father   . Bipolar disorder Sister   . Diabetes Sister   . Fibromyalgia Sister   . Cancer Paternal Uncle        LUNG  . Cancer Maternal Grandmother 46       BREAST  . Cancer Paternal Grandfather        LUNG    Social History   Socioeconomic History  . Marital status: Divorced    Spouse name: Not on file  . Number of children: Not on file  . Years of education: Not on file  . Highest education level: Not on file  Occupational History  . Not on file  Tobacco Use  . Smoking status: Never Smoker  . Smokeless tobacco: Never Used  Vaping Use  . Vaping Use: Never used  Substance and Sexual  Activity  . Alcohol use: Yes    Comment: VERY RARE OCCASION  . Drug use: Not on file  . Sexual activity: Not on file  Other Topics Concern  . Not on file  Social History Narrative  . Not on file   Social Determinants of Health   Financial Resource Strain:   . Difficulty of Paying Living Expenses: Not on file  Food Insecurity:   . Worried About Programme researcher, broadcasting/film/video in the Last Year: Not on file  . Ran Out of Food in the Last Year: Not on file  Transportation Needs:   . Lack of Transportation (Medical): Not on file  . Lack of Transportation (Non-Medical): Not on file  Physical Activity:   . Days of Exercise per Week: Not on file  . Minutes of Exercise per Session: Not on file  Stress:   . Feeling of Stress : Not on file  Social  Connections:   . Frequency of Communication with Friends and Family: Not on file  . Frequency of Social Gatherings with Friends and Family: Not on file  . Attends Religious Services: Not on file  . Active Member of Clubs or Organizations: Not on file  . Attends Banker Meetings: Not on file  . Marital Status: Not on file  Intimate Partner Violence:   . Fear of Current or Ex-Partner: Not on file  . Emotionally Abused: Not on file  . Physically Abused: Not on file  . Sexually Abused: Not on file    Outpatient Medications Prior to Visit  Medication Sig Dispense Refill  . albuterol (VENTOLIN HFA) 108 (90 Base) MCG/ACT inhaler Inhale 2 puffs into the lungs every 6 (six) hours as needed for wheezing or shortness of breath. 18 g 2  . atenolol-chlorthalidone (TENORETIC) 50-25 MG tablet Take 1 tablet by mouth daily.     Marland Kitchen atorvastatin (LIPITOR) 40 MG tablet Take 40 mg by mouth daily at 6 PM.     . budesonide-formoterol (SYMBICORT) 160-4.5 MCG/ACT inhaler Inhale 2 puffs into the lungs 2 (two) times daily. 1 Inhaler 5  . cyclobenzaprine (FLEXERIL) 5 MG tablet Take 1-2 tablets 3 times daily as needed 20 tablet 0  . famotidine (PEPCID) 40 MG tablet TAKE 1 TABLET BY MOUTH TWICE A DAY 180 tablet 1  . gabapentin (NEURONTIN) 300 MG capsule TAKE 1 CAPSULE BY MOUTH THREE TIMES A DAY 270 capsule 1  . ibuprofen (ADVIL) 600 MG tablet Take 1 tablet (600 mg total) by mouth every 6 (six) hours as needed. 30 tablet 0  . irbesartan (AVAPRO) 150 MG tablet Take 1 tablet (150 mg total) by mouth daily. 30 tablet 12  . levothyroxine (SYNTHROID) 25 MCG tablet TAKE 1 TABLET BY MOUTH DAILY BEFORE BREAKFAST. 30 tablet 0  . lidocaine (LIDODERM) 5 % Place 1 patch onto the skin daily. Remove & Discard patch within 12 hours or as directed by MD 30 patch 0  . metFORMIN (GLUCOPHAGE) 1000 MG tablet Take 1 tablet (1,000 mg total) by mouth 2 (two) times daily with a meal. Please call for a fasting follow up appointment.  Thank you, Dr. Sedalia Muta 180 tablet 0  . metFORMIN (GLUCOPHAGE-XR) 500 MG 24 hr tablet Take 1,000 mg by mouth 2 (two) times a day.     . traZODone (DESYREL) 100 MG tablet Take 100 mg by mouth at bedtime as needed for sleep.     Marland Kitchen venlafaxine XR (EFFEXOR-XR) 75 MG 24 hr capsule Take 300 mg by mouth daily. Takes  4 (75mg ) capsules daily=300mg     . buPROPion (WELLBUTRIN XL) 150 MG 24 hr tablet Take 150 mg by mouth every morning.     No facility-administered medications prior to visit.   .med Allergies:   Avelox [moxifloxacin hcl in nacl], Cymbalta [duloxetine hcl], and Oxycodone-acetaminophen   Social History   Tobacco Use  . Smoking status: Never Smoker  . Smokeless tobacco: Never Used  Vaping Use  . Vaping Use: Never used  Substance Use Topics  . Alcohol use: Yes    Comment: VERY RARE OCCASION  . Drug use: Not on file     Review of Systems  Constitutional: Negative for chills, fever, malaise/fatigue and weight loss.  HENT: Positive for congestion and sinus pain. Negative for sore throat.   Eyes: Negative.   Respiratory: Negative.   Cardiovascular: Negative.   Gastrointestinal: Negative.   Genitourinary: Negative.   Musculoskeletal: Negative.   Psychiatric/Behavioral: Negative.      Labs/Other Tests and Data Reviewed:    Recent Labs: 12/31/2019: ALT 57; BUN 14; Creatinine, Ser 0.83; Hemoglobin 12.6; Platelets 246.0; Potassium 3.5; Pro B Natriuretic peptide (BNP) 39.0; Sodium 133   Recent Lipid Panel No results found for: CHOL, TRIG, HDL, CHOLHDL, LDLCALC, LDLDIRECT  Wt Readings from Last 3 Encounters:  10/04/19 290 lb (131.5 kg)  05/26/19 289 lb (131.1 kg)  05/06/19 293 lb (132.9 kg)     Objective:    Vital Signs:  Temp (!) 97 F (36.1 C)   Ht 5' 10.5" (1.791 m)   BMI 41.02 kg/m    Physical Exam VS reviewed  ASSESSMENT & PLAN:   Diagnoses and all orders for this visit: Acute non-recurrent maxillary sinusitis -     amoxicillin-clavulanate (AUGMENTIN) 875-125 MG  tablet; Take 1 tablet by mouth 2 (two) times daily. -     predniSONE (DELTASONE) 10 MG tablet; Take 1 tablet (10 mg total) by mouth daily with breakfast. Take 6ills first day , then 5 pills day 2 and then cut down one pill day until gone  Patient is to take augmentin and prednisone and if not doing well, will be seen     COVID-19 Education: The signs and symptoms of COVID-19 were discussed with the patient and how to seek care for testing (follow up with PCP or arrange E-visit). The importance of social distancing was discussed today.  Time:   Today, I have spent 20 minutes with the patient with telehealth technology discussing the above problems.    Follow Up:  In Person prn  Signed, 09-22-1986, MD  06/17/2020 7:40 AM    [facility]

## 2020-06-28 ENCOUNTER — Other Ambulatory Visit: Payer: Self-pay | Admitting: Family Medicine

## 2020-07-20 ENCOUNTER — Telehealth (INDEPENDENT_AMBULATORY_CARE_PROVIDER_SITE_OTHER): Payer: BC Managed Care – PPO | Admitting: Family Medicine

## 2020-07-20 ENCOUNTER — Encounter: Payer: Self-pay | Admitting: Family Medicine

## 2020-07-20 VITALS — Ht 70.5 in | Wt 280.0 lb

## 2020-07-20 DIAGNOSIS — J019 Acute sinusitis, unspecified: Secondary | ICD-10-CM | POA: Diagnosis not present

## 2020-07-20 MED ORDER — SULFAMETHOXAZOLE-TRIMETHOPRIM 800-160 MG PO TABS: 1.0000 | ORAL_TABLET | Freq: Two times a day (BID) | ORAL | 0 refills | Status: DC

## 2020-07-20 NOTE — Progress Notes (Signed)
Virtual Visit via Telephone Note  I connected with Holly [name] on 07/20/20 at  8:30 AM EDT by telephone and verified that I am speaking with the correct person using two identifiers. DOB/address  Location: Patient: home Provider: clinic   I discussed the limitations, risks, security and privacy concerns of performing an evaluation and management service by telephone and the availability of in person appointments. I also discussed with the patient that there may be a patient responsible charge related to this service. The patient expressed understanding and agreed to proceed.   History of Present Illness: COVID send off Friday-results on Sat-NEGATIVE Pt with headache(ibuprofen). Pt went to a woman's conference 10 days ago-onset symptoms on Monday-cough, congestion with concern for worsening symptoms. No fever. Pt with h/o of sinus infections-trigger allergies-takes OTC meds, no allergy shots. Pt has used oral steroids and antibiotics in the past-last used Augmentin not completely resolve symptoms with this medication-nausea at times. DM   Observations/Objective: No vitals-phone visit  Assessment and Plan: 1. Acute non-recurrent sinusitis, unspecified location Pt with no allergies to antibiotics. Use of PEN in the past with incomplete resolution. Pt has used oral steroids in the past. Advised starting flonase 2 sprays to each nostril daily then 1 spray to each nostril for maintenance over the next 2 months. Start mucinex 600mg  BID. Bactrim sent to pharmacy-rx Follow Up Instructions:as needed in clinic    I discussed the assessment and treatment plan with the patient. The patient was provided an opportunity to ask questions and all were answered. The patient agreed with the plan and demonstrated an understanding of the instructions.   The patient was advised to call back or seek an in-person evaluation if the symptoms worsen or if the condition fails to improve as anticipated.  I provided 7  minutes of non-face-to-face time during this encounter.   Kylen Ismael , MD

## 2020-07-30 ENCOUNTER — Other Ambulatory Visit: Payer: Self-pay

## 2020-07-30 MED ORDER — LEVOTHYROXINE SODIUM 25 MCG PO TABS: 25.0000 ug | ORAL_TABLET | Freq: Every day | ORAL | 0 refills | Status: DC

## 2020-08-02 ENCOUNTER — Other Ambulatory Visit: Payer: Self-pay

## 2020-08-02 ENCOUNTER — Encounter: Payer: Self-pay | Admitting: Family Medicine

## 2020-08-02 ENCOUNTER — Ambulatory Visit: Payer: BC Managed Care – PPO | Admitting: Family Medicine

## 2020-08-02 VITALS — BP 110/68 | HR 88 | Temp 97.3°F | Ht 70.5 in | Wt 287.4 lb

## 2020-08-02 DIAGNOSIS — Z6841 Body Mass Index (BMI) 40.0 and over, adult: Secondary | ICD-10-CM

## 2020-08-02 DIAGNOSIS — Z1231 Encounter for screening mammogram for malignant neoplasm of breast: Secondary | ICD-10-CM

## 2020-08-02 DIAGNOSIS — D869 Sarcoidosis, unspecified: Secondary | ICD-10-CM | POA: Diagnosis not present

## 2020-08-02 DIAGNOSIS — J0101 Acute recurrent maxillary sinusitis: Secondary | ICD-10-CM

## 2020-08-02 DIAGNOSIS — E782 Mixed hyperlipidemia: Secondary | ICD-10-CM | POA: Insufficient documentation

## 2020-08-02 DIAGNOSIS — Z23 Encounter for immunization: Secondary | ICD-10-CM

## 2020-08-02 DIAGNOSIS — E039 Hypothyroidism, unspecified: Secondary | ICD-10-CM

## 2020-08-02 DIAGNOSIS — E1121 Type 2 diabetes mellitus with diabetic nephropathy: Secondary | ICD-10-CM | POA: Diagnosis not present

## 2020-08-02 DIAGNOSIS — F331 Major depressive disorder, recurrent, moderate: Secondary | ICD-10-CM

## 2020-08-02 DIAGNOSIS — E1142 Type 2 diabetes mellitus with diabetic polyneuropathy: Secondary | ICD-10-CM

## 2020-08-02 DIAGNOSIS — I10 Essential (primary) hypertension: Secondary | ICD-10-CM

## 2020-08-02 HISTORY — DX: Morbid (severe) obesity due to excess calories: E66.01

## 2020-08-02 HISTORY — DX: Type 2 diabetes mellitus with diabetic polyneuropathy: E11.42

## 2020-08-02 HISTORY — DX: Mixed hyperlipidemia: E78.2

## 2020-08-02 HISTORY — DX: Body Mass Index (BMI) 40.0 and over, adult: Z684

## 2020-08-02 HISTORY — DX: Hypothyroidism, unspecified: E03.9

## 2020-08-02 MED ORDER — AZITHROMYCIN 250 MG PO TABS: ORAL_TABLET | ORAL | 0 refills | Status: DC

## 2020-08-02 MED ORDER — BUPROPION HCL ER (XL) 300 MG PO TB24: 300.0000 mg | ORAL_TABLET | Freq: Every day | ORAL | 0 refills | Status: DC

## 2020-08-02 MED ORDER — IRBESARTAN 75 MG PO TABS: 75.0000 mg | ORAL_TABLET | Freq: Every day | ORAL | 0 refills | Status: DC

## 2020-08-02 NOTE — Patient Instructions (Addendum)
Check sugars daily fasting.  Work on eating healthier.   Recommend start irbesartan 75 mg once daily for prevention of spilling protein (nephropathy.) Start on Wellbutrin XL 300 mg once in a.m.  Follow-up with Dr. Denyce Robert Recommend counseling either at [facility] or local counselor. Recommend [facility] counseling with Dr. Aquilla Solian or with [facility]. Phone numbers [phone]  Vaccines: Flu shot and Pneumovax 23 given today Ordering a mammogram for routine breast cancer screening. Patient is to follow-up in 3 months fasting. Also patient needs an annual physical before the end of the year.  Diabetes Basics  Diabetes (diabetes mellitus) is a long-term (chronic) disease. It occurs when the body does not properly use sugar (glucose) that is released from food after you eat. Diabetes may be caused by one or both of these problems:  Your pancreas does not make enough of a hormone called insulin.  Your body does not react in a normal way to insulin that it makes. Insulin lets sugars (glucose) go into cells in your body. This gives you energy. If you have diabetes, sugars cannot get into cells. This causes high blood sugar (hyperglycemia). Follow these instructions at home: How do I manage my blood sugar? Check your blood sugar levels using a blood glucose monitor as directed by your doctor. Your doctor will set treatment goals for you. Generally, you should have these blood sugar levels:  Before meals (preprandial): 80-130 mg/dL (8.1-4.4 mmol/L).  After meals (postprandial): below 180 mg/dL (10 mmol/L).  A1c level: less than 7%. Write down the times that you will check your blood sugar levels: Blood sugar checks  Time: _______________ Notes: ___________________________________  Time: _______________ Notes: ___________________________________  Time: _______________ Notes: ___________________________________  Time: _______________ Notes: ___________________________________  Time:  _______________ Notes: ___________________________________  Time: _______________ Notes: ___________________________________  What do I need to know about low blood sugar? Low blood sugar is called hypoglycemia. This is when blood sugar is at or below 70 mg/dL (3.9 mmol/L). Symptoms may include:  Feeling: ? Hungry. ? Worried or nervous (anxious). ? Sweaty and clammy. ? Confused. ? Dizzy. ? Sleepy. ? Sick to your stomach (nauseous).  Having: ? A fast heartbeat. ? A headache. ? A change in your vision. ? Tingling or no feeling (numbness) around the mouth, lips, or tongue. ? Jerky movements that you cannot control (seizure).  Having trouble with: ? Moving (coordination). ? Sleeping. ? Passing out (fainting). ? Getting upset easily (irritability). Treating low blood sugar To treat low blood sugar, eat or drink something sugary right away. If you can think clearly and swallow safely, follow the 15:15 rule:  Take 15 grams of a fast-acting carb (carbohydrate). Talk with your doctor about how much you should take.  Some fast-acting carbs are: ? Sugar tablets (glucose pills). Take 3-4 glucose pills. ? 6-8 pieces of hard candy. ? 4-6 oz (120-150 mL) of fruit juice. ? 4-6 oz (120-150 mL) of regular (not diet) soda. ? 1 Tbsp (15 mL) honey or sugar.  Check your blood sugar 15 minutes after you take the carb.  If your blood sugar is still at or below 70 mg/dL (3.9 mmol/L), take 15 grams of a carb again.  If your blood sugar does not go above 70 mg/dL (3.9 mmol/L) after 3 tries, get help right away.  After your blood sugar goes back to normal, eat a meal or a snack within 1 hour. Treating very low blood sugar If your blood sugar is at or below 54 mg/dL (3 mmol/L), you have  very low blood sugar (severe hypoglycemia). This is an emergency. Do not wait to see if the symptoms will go away. Get medical help right away. Call your local emergency services (911 in the [place] Do not drive  yourself to the hospital. Questions to ask your health care provider  Do I need to meet with a diabetes educator?  What equipment will I need to care for myself at home?  What diabetes medicines do I need? When should I take them?  How often do I need to check my blood sugar?  What number can I call if I have questions?  When is my next doctor's visit?  Where can I find a support group for people with diabetes? Where to find more information  American Diabetes Association: www.diabetes.org  American Association of Diabetes Educators: www.diabeteseducator.org/patient-resources Contact a doctor if:  Your blood sugar is at or above 240 mg/dL (29.5 mmol/L) for 2 days in a row.  You have been sick or have had a fever for 2 days or more, and you are not getting better.  You have any of these problems for more than 6 hours: ? You cannot eat or drink. ? You feel sick to your stomach (nauseous). ? You throw up (vomit). ? You have watery poop (diarrhea). Get help right away if:  Your blood sugar is lower than 54 mg/dL (3 mmol/L).  You get confused.  You have trouble: ? Thinking clearly. ? Breathing. Summary  Diabetes (diabetes mellitus) is a long-term (chronic) disease. It occurs when the body does not properly use sugar (glucose) that is released from food after digestion.  Take insulin and diabetes medicines as told.  Check your blood sugar every day, as often as told.  Keep all follow-up visits as told by your doctor. This is important. This information is not intended to replace advice given to you by your health care provider. Make sure you discuss any questions you have with your health care provider. Document Revised: 07/02/2019 Document Reviewed: 01/11/2018 Elsevier Patient Education  2020 Elsevier Inc.   Major Depressive Disorder, Adult Major depressive disorder (MDD) is a mental health condition. MDD often makes you feel sad, hopeless, or helpless. MDD can  also cause symptoms in your body. MDD can affect your:  Work.  School.  Relationships.  Other normal activities. MDD can range from mild to very bad. It may occur once (single episode MDD). It can also occur many times (recurrent MDD). The main symptoms of MDD often include:  Feeling sad, depressed, or irritable most of the time.  Loss of interest. MDD symptoms also include:  Sleeping too much or too little.  Eating too much or too little.  A change in your weight.  Feeling tired (fatigue) or having low energy.  Feeling worthless.  Feeling guilty.  Trouble making decisions.  Trouble thinking clearly.  Thoughts of suicide or harming others.  Feeling weak.  Feeling agitated.  Keeping yourself from being around other people (isolation). Follow these instructions at home: Activity  Do these things as told by your doctor: ? Go back to your normal activities. ? Exercise regularly. ? Spend time outdoors. Alcohol  Talk with your doctor about how alcohol can affect your antidepressant medicines.  Do not drink alcohol. Or, limit how much alcohol you drink. ? This means no more than 1 drink a day for nonpregnant women and 2 drinks a day for men. One drink equals one of these:  12 oz of beer.  5 oz  of wine.  1 oz of hard liquor. General instructions  Take over-the-counter and prescription medicines only as told by your doctor.  Eat a healthy diet.  Get plenty of sleep.  Find activities that you enjoy. Make time to do them.  Think about joining a support group. Your doctor may be able to suggest a group for you.  Keep all follow-up visits as told by your doctor. This is important. Where to find more information:  [employer] on Mental Illness: ? www.nami.org  [place] [employer] of Mental Health: ? http://www.maynard.net/  National Suicide Prevention Lifeline: ? (269)131-1097. This is free, 24-hour help. Contact a doctor if:  Your symptoms  get worse.  You have new symptoms. Get help right away if:  You self-harm.  You see, hear, taste, smell, or feel things that are not present (hallucinate). If you ever feel like you may hurt yourself or others, or have thoughts about taking your own life, get help right away. You can go to your nearest emergency department or call:  Your local emergency services (911 in the [place]  A suicide crisis helpline, such as the National Suicide Prevention Lifeline: ? (431) 049-2063. This is open 24 hours a day. This information is not intended to replace advice given to you by your health care provider. Make sure you discuss any questions you have with your health care provider. Document Revised: 09/21/2017 Document Reviewed: 06/25/2016 Elsevier Patient Education  2020 [employer].

## 2020-08-02 NOTE — Progress Notes (Signed)
Subjective:  Patient ID: Holly [name], female    DOB: 04/21/1966  Age: 54 y.o. MRN: [id number]  Chief Complaint  Patient presents with   Diabetes    HPI Pt presents for follow up of hypertension.  Date of diagnosis 2012.  Her current cardiac medication regimen includes atenolol/chlorthalidone..  She is tolerating the medication well without side effects.  Compliance with treatment has been good; she takes her medication as directed, maintains her diet and exercise regimen, and follows up as directed.      Type 2 diabetes mellitus without complications details; specifically, this is type 2, non-insulin requiring diabetes without complications.  Compliance with treatment has been good; she takes her medication as directed, maintains her diet and exercise regimen, follows up as directed, but does not keep a glucose diary.  Date of diagnosis 2018.  Primary symptoms reported include peripheral neuropathy (manifested as numbness and tingling ) and polydipsia.  She specifically denies blurred vision, fatigue, polyphagia or polyuria.  Current meds include an oral hypoglycemic (Glucophage 1000mg  bid). Most recent lab results include glycohemoglobin 6.9% (07/22/2019).  In regard to preventative care, she performs foot self-exams daily and her last ophthalmology exam is overdue.      In regard to the other specified hypothyroidism, she is currently taking Levothyroid, 25 mcg daily.  She denies any related symptoms.      Dx with major depressive disorder, single episode, unspecified; this is a routine follow-up.    Presently, she feels a moderate degree of depression.  Current medications include Effexor XR.  Current affective symptoms include anhedonia, anxious mood, insomnia,  decreased ability to concentrate, fatigue and sadness.  Presently, Holly [name] denies suicidal ideation.      Holly [name] presents with a diagnosis of sarcoidosis of lung.  This was diagnosed 04/2019.  Associated symptoms include dyspnea.  She denies  chest pain or cough.  Recently came of off prednisone taper. Also taking symbicort.    Current Outpatient Medications on File Prior to Visit  Medication Sig Dispense Refill   albuterol (VENTOLIN HFA) 108 (90 Base) MCG/ACT inhaler Inhale 2 puffs into the lungs every 6 (six) hours as needed for wheezing or shortness of breath. 18 g 2   atenolol-chlorthalidone (TENORETIC) 50-25 MG tablet Take 1 tablet by mouth daily.      budesonide-formoterol (SYMBICORT) 160-4.5 MCG/ACT inhaler Inhale 2 puffs into the lungs 2 (two) times daily. 1 Inhaler 5   famotidine (PEPCID) 40 MG tablet TAKE 1 TABLET BY MOUTH TWICE A DAY 180 tablet 1   gabapentin (NEURONTIN) 300 MG capsule TAKE 1 CAPSULE BY MOUTH THREE TIMES A DAY 270 capsule 1   ibuprofen (ADVIL) 600 MG tablet Take 1 tablet (600 mg total) by mouth every 6 (six) hours as needed. 30 tablet 0   metFORMIN (GLUCOPHAGE) 1000 MG tablet Take 1 tablet (1,000 mg total) by mouth 2 (two) times daily with a meal. Please call for a fasting follow up appointment. Thank you, Dr. 05/2019 180 tablet 0   traZODone (DESYREL) 100 MG tablet Take 100 mg by mouth at bedtime as needed for sleep.      venlafaxine XR (EFFEXOR-XR) 75 MG 24 hr capsule Take 300 mg by mouth daily. Takes 4 (75mg ) capsules daily=300mg      No current facility-administered medications on file prior to visit.   Past Medical History:  Diagnosis Date   Anxiety    COVID-19 virus detected 12/30/2019   10/30/2019-SARS-CoV-2-positive Didn't require hospitalization     Depression  Diabetes mellitus without complication (HCC)    Fatty liver    Goiter    Hashimoto's thyroiditis    Hilar adenopathy 03/20/19  04/11/19   CTA CHEST and PET per DR. Anne Ng [facility]   History of kidney stones    Hypertension    Mediastinal adenopathy    PER CTA CHEST PET SCAN per DR. Lanae Crumbly [facility]   Pneumonia    Supraclavicular adenopathy 03/20/2019   PER CTA CHEST and PET per DR. Sudie Bailey [facility]   Past Surgical  History:  Procedure Laterality Date   ABDOMINAL HYSTERECTOMY     COMPLETE   ADHESIOLYSIS     APPENDECTOMY     CESAREAN SECTION     MEDIASTINOSCOPY N/A 04/18/2019   Procedure: MEDIASTINOSCOPY;  Surgeon: Delight Ovens, MD;  Location: [facility] OR;  Service: Thoracic;  Laterality: N/A;   VIDEO BRONCHOSCOPY WITH ENDOBRONCHIAL ULTRASOUND N/A 04/18/2019   Procedure: VIDEO BRONCHOSCOPY WITH ENDOBRONCHIAL ULTRASOUND;  Surgeon: Delight Ovens, MD;  Location: [facility] OR;  Service: Thoracic;  Laterality: N/A;    Family History  Problem Relation Age of Onset   Anuerysm Mother        BRAIN   Cancer Mother        BREAST   COPD Father    Hyperlipidemia Father    Congestive Heart Failure Father    Bipolar disorder Sister    Diabetes Sister    Fibromyalgia Sister    Cancer Paternal Uncle        LUNG   Cancer Maternal Grandmother 59       BREAST   Cancer Paternal Grandfather        LUNG   Social History   Socioeconomic History   Marital status: Divorced    Spouse name: Not on file   Number of children: Not on file   Years of education: Not on file   Highest education level: Not on file  Occupational History   Not on file  Tobacco Use   Smoking status: Never Smoker   Smokeless tobacco: Never Used  Vaping Use   Vaping Use: Never used  Substance and Sexual Activity   Alcohol use: Yes    Comment: VERY RARE OCCASION   Drug use: Never   Sexual activity: Not on file  Other Topics Concern   Not on file  Social History Narrative   Not on file   Social Determinants of Health   Financial Resource Strain:    Difficulty of Paying Living Expenses: Not on file  Food Insecurity:    Worried About Radiation protection practitioner of Food in the Last Year: Not on file   [employer] of Food in the Last Year: Not on file  Transportation Needs:    Lack of Transportation (Medical): Not on file   Lack of Transportation (Non-Medical): Not on file  Physical Activity:    Days of Exercise  per Week: Not on file   Minutes of Exercise per Session: Not on file  Stress:    Feeling of Stress : Not on file  Social Connections:    Frequency of Communication with Friends and Family: Not on file   Frequency of Social Gatherings with Friends and Family: Not on file   Attends Religious Services: Not on file   Active Member of Clubs or Organizations: Not on file   Attends Banker Meetings: Not on file   Marital Status: Not on file    Review of Systems  Constitutional: Positive for fatigue. Negative for chills  and fever.  HENT: Positive for congestion, rhinorrhea and sore throat. Negative for ear pain and postnasal drip.   Eyes: Negative for visual disturbance.  Respiratory: Positive for cough (Goes with sarcoid and current sinus infection) and shortness of breath (baseline).   Cardiovascular: Positive for chest pain (left ribcage pain ).  Gastrointestinal: Positive for diarrhea (Occ if diverticulitis flares). Negative for abdominal pain, constipation, nausea and vomiting.  Endocrine: Positive for polydipsia, polyphagia and polyuria (drinks lots of water).  Genitourinary: Negative for dysuria and urgency.  Musculoskeletal: Positive for arthralgias (bl knees), back pain and neck pain. Negative for myalgias.  Neurological: Positive for headaches (seldom). Negative for dizziness, weakness and light-headedness.  Psychiatric/Behavioral: Positive for dysphoric mood. The patient is nervous/anxious.      Objective:  BP 110/68    Pulse 88    Temp (!) 97.3 F (36.3 C)    Ht 5' 10.5" (1.791 m)    Wt 287 lb 6.4 oz (130.4 kg)    BMI 40.65 kg/m   BP/Weight 08/02/2020 07/20/2020 06/17/2020  Systolic BP 110 - -  Diastolic BP 68 - -  Wt. (Lbs) 287.4 280 -  BMI 40.65 39.61 41.02    Physical Exam  Diabetic Foot Exam - Simple   No data filed       Lab Results  Component Value Date   WBC 7.5 08/02/2020   HGB 14.3 08/02/2020   HCT 43.3 08/02/2020   PLT 292  08/02/2020   GLUCOSE 132 (H) 08/02/2020   CHOL 217 (H) 08/02/2020   TRIG 183 (H) 08/02/2020   HDL 42 08/02/2020   LDLCALC 142 (H) 08/02/2020   ALT 26 08/02/2020   AST 26 08/02/2020   NA 141 08/02/2020   K 4.6 08/02/2020   CL 102 08/02/2020   CREATININE 0.77 08/02/2020   BUN 11 08/02/2020   CO2 24 08/02/2020   TSH 3.030 08/02/2020   INR 1.0 04/16/2019   HGBA1C 7.0 (H) 08/02/2020      Assessment & Plan:   1. Primary hypertension Well controlled.  No changes to medicines.  Continue to work on eating a healthy diet and exercise.  Labs drawn today.  - CBC with Differential/Platelet - Comprehensive metabolic panel  2. Diabetic glomerulopathy (HCC) Control: good Recommend check sugars fasting daily. Recommend check feet daily. Recommend annual eye exams. Medicines: contimue current medications. Schedule eye exam. Continue to work on eating a healthy diet and exercise.  Labs drawn today.   - Hemoglobin A1c - POCT UA - Microalbumin  3. Depression, major, recurrent, moderate (HCC) Currently being managed by Dr. Algis Downs with [facility] Follow up as scheduled with [facility]. Increase wellbutrin xl to 300 mg once daily in am. - buPROPion (WELLBUTRIN XL) 300 MG 24 hr tablet; Take 1 tablet (300 mg total) by mouth daily.  Dispense: 90 tablet; Refill: 0  4. Sarcoidosis Stable.  Managed by pulmonary.  5. Acute recurrent maxillary sinusitis Start on Z-Pak. - azithromycin (ZITHROMAX) 250 MG tablet; 2 DAILY FOR FIRST DAY, THEN DECREASE TO ONE DAILY FOR 4 MORE DAYS.  Dispense: 6 tablet; Refill: 0  6. Hypothyroidism (acquired) Well controlled.  No changes to medicines.  Continue to work on eating a healthy diet and exercise.  Labs drawn today.  - TSH  7. Diabetic polyneuropathy associated with type 2 diabetes mellitus (HCC) Diabetes fairly well controlled with an A1c of 7.  See above for details.  8. Mixed hyperlipidemia Not at goal. Increase Lipitor to 80 mg once daily Continue to  work on eating a healthy diet and exercise.  Labs drawn today.  - Lipid panel  9. Morbid obesity (HCC) Recommend work on eating healthy (low fat, low carb, low sugar) and exercising.  This will also help her depression.  10. BMI 40.0-44.9, adult (HCC) See above.  11. Visit for screening mammogram - MM DIGITAL SCREENING BILATERAL  12. Encounter for immunization - Flu Vaccine MDCK QUAD PF - Pneumococcal polysaccharide vaccine 23-valent greater than or equal to 2yo subcutaneous/IM   Follow-up: Return in about 3 months (around 11/02/2020) for fasting.Marland Kitchen.  An After Visit Summary was printed and given to the patient.  Blane OharaKirsten Dalonda Simoni [facility] [phone]

## 2020-08-03 LAB — COMPREHENSIVE METABOLIC PANEL
ALT: 26 IU/L (ref 0–32)
AST: 26 IU/L (ref 0–40)
Albumin/Globulin Ratio: 1.4 (ref 1.2–2.2)
Albumin: 4.3 g/dL (ref 3.8–4.9)
Alkaline Phosphatase: 91 IU/L (ref 44–121)
BUN/Creatinine Ratio: 14 (ref 9–23)
BUN: 11 mg/dL (ref 6–24)
Bilirubin Total: 0.2 mg/dL (ref 0.0–1.2)
CO2: 24 mmol/L (ref 20–29)
Calcium: 9.4 mg/dL (ref 8.7–10.2)
Chloride: 102 mmol/L (ref 96–106)
Creatinine, Ser: 0.77 mg/dL (ref 0.57–1.00)
GFR calc Af Amer: 102 mL/min/{1.73_m2} (ref >59)
GFR calc non Af Amer: 88 mL/min/{1.73_m2} (ref >59)
Globulin, Total: 3 g/dL (ref 1.5–4.5)
Glucose: 132 mg/dL — ABNORMAL HIGH (ref 65–99)
Potassium: 4.6 mmol/L (ref 3.5–5.2)
Sodium: 141 mmol/L (ref 134–144)
Total Protein: 7.3 g/dL (ref 6.0–8.5)

## 2020-08-03 LAB — CBC WITH DIFFERENTIAL/PLATELET
Basophils Absolute: 0.1 10*3/uL (ref 0.0–0.2)
Basos: 1 %
EOS (ABSOLUTE): 0.2 10*3/uL (ref 0.0–0.4)
Eos: 2 %
Hematocrit: 43.3 % (ref 34.0–46.6)
Hemoglobin: 14.3 g/dL (ref 11.1–15.9)
Immature Grans (Abs): 0 10*3/uL (ref 0.0–0.1)
Immature Granulocytes: 0 %
Lymphocytes Absolute: 2.2 10*3/uL (ref 0.7–3.1)
Lymphs: 30 %
MCH: 30.2 pg (ref 26.6–33.0)
MCHC: 33 g/dL (ref 31.5–35.7)
MCV: 92 fL (ref 79–97)
Monocytes Absolute: 0.5 10*3/uL (ref 0.1–0.9)
Monocytes: 6 %
Neutrophils Absolute: 4.6 10*3/uL (ref 1.4–7.0)
Neutrophils: 61 %
Platelets: 292 10*3/uL (ref 150–450)
RBC: 4.73 x10E6/uL (ref 3.77–5.28)
RDW: 12.4 % (ref 11.7–15.4)
WBC: 7.5 10*3/uL (ref 3.4–10.8)

## 2020-08-03 LAB — HEMOGLOBIN A1C
Est. average glucose Bld gHb Est-mCnc: 154 mg/dL
Hgb A1c MFr Bld: 7 % — ABNORMAL HIGH (ref 4.8–5.6)

## 2020-08-03 LAB — LIPID PANEL
Chol/HDL Ratio: 5.2 ratio — ABNORMAL HIGH (ref 0.0–4.4)
Cholesterol, Total: 217 mg/dL — ABNORMAL HIGH (ref 100–199)
HDL: 42 mg/dL (ref >39)
LDL Chol Calc (NIH): 142 mg/dL — ABNORMAL HIGH (ref 0–99)
Triglycerides: 183 mg/dL — ABNORMAL HIGH (ref 0–149)
VLDL Cholesterol Cal: 33 mg/dL (ref 5–40)

## 2020-08-03 LAB — CARDIOVASCULAR RISK ASSESSMENT

## 2020-08-03 LAB — TSH: TSH: 3.03 u[IU]/mL (ref 0.450–4.500)

## 2020-08-04 ENCOUNTER — Other Ambulatory Visit: Payer: Self-pay

## 2020-08-04 MED ORDER — ATORVASTATIN CALCIUM 80 MG PO TABS: 80.0000 mg | ORAL_TABLET | Freq: Every day | ORAL | 0 refills | Status: DC

## 2020-08-04 MED ORDER — LEVOTHYROXINE SODIUM 25 MCG PO TABS
25.0000 ug | ORAL_TABLET | Freq: Every day | ORAL | 3 refills | Status: DC
Start: 2020-08-04 — End: 2021-08-28

## 2020-08-11 ENCOUNTER — Other Ambulatory Visit: Payer: Self-pay | Admitting: Family Medicine

## 2020-08-15 ENCOUNTER — Encounter: Payer: Self-pay | Admitting: Family Medicine

## 2020-08-15 LAB — POCT UA - MICROALBUMIN: Microalbumin Ur, POC: 80 mg/L

## 2020-08-23 ENCOUNTER — Encounter: Payer: Self-pay | Admitting: Family Medicine

## 2020-08-23 ENCOUNTER — Telehealth (INDEPENDENT_AMBULATORY_CARE_PROVIDER_SITE_OTHER): Payer: BC Managed Care – PPO | Admitting: Family Medicine

## 2020-08-23 VITALS — BP 132/80 | HR 78 | Temp 98.4°F | Resp 18 | Ht 70.5 in | Wt 285.0 lb

## 2020-08-23 DIAGNOSIS — R059 Cough, unspecified: Secondary | ICD-10-CM

## 2020-08-23 DIAGNOSIS — J4541 Moderate persistent asthma with (acute) exacerbation: Secondary | ICD-10-CM

## 2020-08-23 DIAGNOSIS — J018 Other acute sinusitis: Secondary | ICD-10-CM | POA: Diagnosis not present

## 2020-08-23 LAB — POC COVID19 BINAXNOW: SARS Coronavirus 2 Ag: NEGATIVE

## 2020-08-23 MED ORDER — AMOXICILLIN 875 MG PO TABS: 875.0000 mg | ORAL_TABLET | Freq: Two times a day (BID) | ORAL | 0 refills | Status: DC

## 2020-08-23 NOTE — Progress Notes (Signed)
Virtual Visit via Telephone Note   This visit type was conducted due to national recommendations for restrictions regarding the COVID-19 Pandemic (e.g. social distancing) in an effort to limit this patient's exposure and mitigate transmission in our community.  Due to her co-morbid illnesses, this patient is at least at moderate risk for complications without adequate follow up.  This format is felt to be most appropriate for this patient at this time.  The patient did not have access to video technology/had technical difficulties with video requiring transitioning to audio format only (telephone).  All issues noted in this document were discussed and addressed.  No physical exam could be performed with this format.  Patient verbally consented to a telehealth visit.   Date:  08/23/2020   ID:  Holly [name], DOB 1966/10/05, MRN [id number]  Patient Location: Home Provider Location: Office/Clinic  PCP:  Blane Ohara, MD   Evaluation Performed: acute visit  Chief Complaint:  cough  History of Present Illness:    Holly [name] is a 54 y.o. female with URI symptoms. States symptoms started 7 days ago, symptoms are worsening. Coughing all night, cough drops are not helping. Has tried nasonex and mucinex which have not helped. She has not had to use her inhalers. Complaining of severe fatigue, congestion, ear pain (Left), sinus pain and sore throat, cough, sputum production (Yellowish- gradually becoming thicker) and shortness of breath.   Achy - back pain (from coughing.)  Sinus headaches   The patient does have symptoms concerning for COVID-19 infection ( cough and shortness of breath).    Past Medical History:  Diagnosis Date  . Anxiety   . COVID-19 virus detected 12/30/2019   10/30/2019-SARS-CoV-2-positive Didn't require hospitalization    . Depression   . Diabetes mellitus without complication (HCC)   . Fatty liver   . Goiter   . Hashimoto's thyroiditis   . Hilar adenopathy 03/20/19   04/11/19   CTA CHEST and PET per DR. Anne Ng [facility]  . History of kidney stones   . Hypertension   . Mediastinal adenopathy    PER CTA CHEST PET SCAN per DR. Lanae Crumbly [facility]  . Pneumonia   . Supraclavicular adenopathy 03/20/2019   PER CTA CHEST and PET per DR. Conchita Paris    Past Surgical History:  Procedure Laterality Date  . ABDOMINAL HYSTERECTOMY     COMPLETE  . ADHESIOLYSIS    . APPENDECTOMY    . CESAREAN SECTION    . MEDIASTINOSCOPY N/A 04/18/2019   Procedure: MEDIASTINOSCOPY;  Surgeon: Delight Ovens, MD;  Location: [facility] OR;  Service: Thoracic;  Laterality: N/A;  . VIDEO BRONCHOSCOPY WITH ENDOBRONCHIAL ULTRASOUND N/A 04/18/2019   Procedure: VIDEO BRONCHOSCOPY WITH ENDOBRONCHIAL ULTRASOUND;  Surgeon: Delight Ovens, MD;  Location: [facility] OR;  Service: Thoracic;  Laterality: N/A;    Family History  Problem Relation Age of Onset  . Anuerysm Mother        BRAIN  . Cancer Mother        BREAST  . COPD Father   . Hyperlipidemia Father   . Congestive Heart Failure Father   . Bipolar disorder Sister   . Diabetes Sister   . Fibromyalgia Sister   . Cancer Paternal Uncle        LUNG  . Cancer Maternal Grandmother 46       BREAST  . Cancer Paternal Grandfather        LUNG    Social History   Socioeconomic History  . Marital  status: Divorced    Spouse name: Not on file  . Number of children: Not on file  . Years of education: Not on file  . Highest education level: Not on file  Occupational History  . Not on file  Tobacco Use  . Smoking status: Never Smoker  . Smokeless tobacco: Never Used  Vaping Use  . Vaping Use: Never used  Substance and Sexual Activity  . Alcohol use: Yes    Comment: VERY RARE OCCASION  . Drug use: Never  . Sexual activity: Not on file  Other Topics Concern  . Not on file  Social History Narrative  . Not on file   Social Determinants of Health   Financial Resource Strain:   . Difficulty of Paying Living Expenses: Not on file  Food  Insecurity:   . Worried About Programme researcher, broadcasting/film/video in the Last Year: Not on file  . Ran Out of Food in the Last Year: Not on file  Transportation Needs:   . Lack of Transportation (Medical): Not on file  . Lack of Transportation (Non-Medical): Not on file  Physical Activity:   . Days of Exercise per Week: Not on file  . Minutes of Exercise per Session: Not on file  Stress:   . Feeling of Stress : Not on file  Social Connections:   . Frequency of Communication with Friends and Family: Not on file  . Frequency of Social Gatherings with Friends and Family: Not on file  . Attends Religious Services: Not on file  . Active Member of Clubs or Organizations: Not on file  . Attends Banker Meetings: Not on file  . Marital Status: Not on file  Intimate Partner Violence:   . Fear of Current or Ex-Partner: Not on file  . Emotionally Abused: Not on file  . Physically Abused: Not on file  . Sexually Abused: Not on file    Outpatient Medications Prior to Visit  Medication Sig Dispense Refill  . albuterol (VENTOLIN HFA) 108 (90 Base) MCG/ACT inhaler Inhale 2 puffs into the lungs every 6 (six) hours as needed for wheezing or shortness of breath. 18 g 2  . atenolol-chlorthalidone (TENORETIC) 50-25 MG tablet Take 1 tablet by mouth daily.     Marland Kitchen atorvastatin (LIPITOR) 80 MG tablet Take 1 tablet (80 mg total) by mouth daily. 90 tablet 0  . budesonide-formoterol (SYMBICORT) 160-4.5 MCG/ACT inhaler Inhale 2 puffs into the lungs 2 (two) times daily. 1 Inhaler 5  . buPROPion (WELLBUTRIN XL) 300 MG 24 hr tablet Take 1 tablet (300 mg total) by mouth daily. 90 tablet 0  . famotidine (PEPCID) 40 MG tablet TAKE 1 TABLET BY MOUTH TWICE A DAY 180 tablet 1  . gabapentin (NEURONTIN) 300 MG capsule TAKE 1 CAPSULE BY MOUTH THREE TIMES A DAY 270 capsule 1  . ibuprofen (ADVIL) 600 MG tablet Take 1 tablet (600 mg total) by mouth every 6 (six) hours as needed. 30 tablet 0  . irbesartan (AVAPRO) 75 MG tablet  Take 1 tablet (75 mg total) by mouth daily. 90 tablet 0  . levothyroxine (SYNTHROID) 25 MCG tablet Take 1 tablet (25 mcg total) by mouth daily before breakfast. 90 tablet 3  . metFORMIN (GLUCOPHAGE) 1000 MG tablet Take 1 tablet (1,000 mg total) by mouth 2 (two) times daily with a meal. Please call for a fasting follow up appointment. Thank you, Dr. Sedalia Muta 180 tablet 0  . traZODone (DESYREL) 100 MG tablet Take 100 mg by mouth at  bedtime as needed for sleep.     Marland Kitchen. venlafaxine XR (EFFEXOR-XR) 75 MG 24 hr capsule Take 300 mg by mouth daily. Takes 4 (75mg ) capsules daily=300mg     . azithromycin (ZITHROMAX) 250 MG tablet 2 DAILY FOR FIRST DAY, THEN DECREASE TO ONE DAILY FOR 4 MORE DAYS. 6 tablet 0   No facility-administered medications prior to visit.    Allergies:   Avelox [moxifloxacin hcl in nacl], Cymbalta [duloxetine hcl], and Oxycodone-acetaminophen   Social History   Tobacco Use  . Smoking status: Never Smoker  . Smokeless tobacco: Never Used  Vaping Use  . Vaping Use: Never used  Substance Use Topics  . Alcohol use: Yes    Comment: VERY RARE OCCASION  . Drug use: Never     Review of Systems  Constitutional: Negative for chills and fever.  HENT: Positive for congestion, ear pain (Left), sinus pain and sore throat.   Eyes: Positive for blurred vision.  Respiratory: Positive for cough, sputum production (Yellowish- gradually becoming thicker) and shortness of breath.   Cardiovascular: Negative for chest pain.  Gastrointestinal: Negative for diarrhea, nausea and vomiting.  Musculoskeletal: Positive for back pain (from coughing) and joint pain.  Neurological: Positive for headaches.     Labs/Other Tests and Data Reviewed:    Recent Labs: 12/31/2019: Pro B Natriuretic peptide (BNP) 39.0 08/02/2020: ALT 26; BUN 11; Creatinine, Ser 0.77; Hemoglobin 14.3; Platelets 292; Potassium 4.6; Sodium 141; TSH 3.030   Recent Lipid Panel Lab Results  Component Value Date/Time   CHOL 217 (H)  08/02/2020 09:39 AM   TRIG 183 (H) 08/02/2020 09:39 AM   HDL 42 08/02/2020 09:39 AM   CHOLHDL 5.2 (H) 08/02/2020 09:39 AM   LDLCALC 142 (H) 08/02/2020 09:39 AM    Wt Readings from Last 3 Encounters:  08/23/20 285 lb (129.3 kg)  08/02/20 287 lb 6.4 oz (130.4 kg)  07/20/20 280 lb (127 kg)     Objective:    Vital Signs:  BP 132/80 (BP Location: Left Arm, Patient Position: Sitting, Cuff Size: Large)   Pulse 78   Temp 98.4 F (36.9 C) (Temporal)   Resp 18   Ht 5' 10.5" (1.791 m)   Wt 285 lb (129.3 kg)   SpO2 97%   BMI 40.32 kg/m    Physical Exam Vitals reviewed.  Constitutional:      Appearance: Normal appearance. She is normal weight.  HENT:     Right Ear: Tympanic membrane, ear canal and external ear normal.     Left Ear: Tympanic membrane, ear canal and external ear normal.     Nose:     Comments: Sinus tenderness BL.  Neck:     Vascular: No carotid bruit.  Cardiovascular:     Rate and Rhythm: Normal rate and regular rhythm.     Heart sounds: Normal heart sounds. No murmur heard.   Pulmonary:     Effort: Pulmonary effort is normal. No respiratory distress.     Breath sounds: Wheezing present.  Abdominal:     General: Abdomen is flat. Bowel sounds are normal.     Palpations: Abdomen is soft.     Tenderness: There is no abdominal tenderness.  Neurological:     Mental Status: She is alert and oriented to person, place, and time.  Psychiatric:        Mood and Affect: Mood normal.        Behavior: Behavior normal.      ASSESSMENT & PLAN:   1. Cough -  POC COVID-19 BinaxNow negative - amoxicillin (AMOXIL) 875 MG tablet; Take 1 tablet (875 mg total) by mouth 2 (two) times daily.  Dispense: 20 tablet; Refill: 0 - Novel Coronavirus, NAA ([facility]) sent off. Came back negative.   2. Acute non-recurrent sinusitis of other sinus  Amoxicillin rx given.  3. Asthma exacerbation, moderate.  Start back on Breztri 2 puffs twice a day.  Use proair hfa 2 puffs every 6 hours  prn wheezing.    Orders Placed This Encounter  Procedures  . Novel Coronavirus, NAA ([facility])  . POC COVID-19 BinaxNow     Meds ordered this encounter  Medications  . amoxicillin (AMOXIL) 875 MG tablet    Sig: Take 1 tablet (875 mg total) by mouth 2 (two) times daily.    Dispense:  20 tablet    Refill:  0    COVID-19 Education: The signs and symptoms of COVID-19 were discussed with the patient and how to seek care for testing (follow up with PCP or arrange E-visit). The importance of social distancing was discussed today.  Time:   Today, I have spent 15 minutes with the patient with telehealth technology discussing the above problems.    Follow Up:  In Person prn  Signed, Blane Ohara, MD  08/23/2020 2:06 PM    [name] [place]

## 2020-08-24 LAB — NOVEL CORONAVIRUS, NAA: SARS-CoV-2, NAA: NOT DETECTED

## 2020-08-26 ENCOUNTER — Telehealth: Payer: Self-pay

## 2020-08-26 ENCOUNTER — Other Ambulatory Visit: Payer: Self-pay | Admitting: Family Medicine

## 2020-08-26 MED ORDER — GUAIFENESIN-CODEINE 100-10 MG/5ML PO SOLN: 5.0000 mL | Freq: Three times a day (TID) | ORAL | 0 refills | Status: DC | PRN

## 2020-08-26 MED ORDER — GUAIFENESIN-CODEINE 100-10 MG/5ML PO SOLN
5.0000 mL | Freq: Three times a day (TID) | ORAL | 0 refills | Status: DC | PRN
Start: 2020-08-26 — End: 2020-09-21

## 2020-08-26 NOTE — Telephone Encounter (Signed)
Sent codeine cough syrup. Needs appt if feels prednisone is necessary.  Pt notified by Eber Jones, RN. Kc

## 2020-08-26 NOTE — Telephone Encounter (Signed)
Holly [name] called to report that she is not feeling better.  She has been taking her antibiotics twice daily and taking her mucinex with no relief.  She is requesting something for cough.

## 2020-08-31 ENCOUNTER — Ambulatory Visit: Payer: BC Managed Care – PPO | Admitting: Family Medicine

## 2020-08-31 ENCOUNTER — Encounter: Payer: Self-pay | Admitting: Family Medicine

## 2020-08-31 ENCOUNTER — Other Ambulatory Visit: Payer: Self-pay

## 2020-08-31 VITALS — BP 126/68 | HR 84 | Temp 98.2°F | Resp 16 | Ht 70.5 in | Wt 294.4 lb

## 2020-08-31 DIAGNOSIS — D869 Sarcoidosis, unspecified: Secondary | ICD-10-CM | POA: Diagnosis not present

## 2020-08-31 DIAGNOSIS — R059 Cough, unspecified: Secondary | ICD-10-CM | POA: Diagnosis not present

## 2020-08-31 MED ORDER — DOXYCYCLINE HYCLATE 100 MG PO TABS: 100.0000 mg | ORAL_TABLET | Freq: Two times a day (BID) | ORAL | 0 refills | Status: DC

## 2020-08-31 MED ORDER — PREDNISONE 10 MG PO TABS: 10.0000 mg | ORAL_TABLET | Freq: Every day | ORAL | 0 refills | Status: DC

## 2020-08-31 NOTE — Progress Notes (Signed)
Established Patient Office Visit  Subjective:  Patient ID: Holly [name], female    DOB: April 04, 1966  Age: 54 y.o. MRN: [id number]  CC:  Chief Complaint  Patient presents with  . Cough    still coughing improved some an headaches    HPI MAGEN SURIANO presents for continued cough-uncontrolled coughing fits with phelgm noted. Pt with h/o Sarcoid-no recent evaluation by pulmonary.  Reviewed CT chest  7/20-4 evaluations by providers-8/21-maxillary sinusitis diagnosed and televideo visit. Pt seen at pharmacy clinic for additional medication given for cough.  Past Medical History:  Diagnosis Date  . Anxiety   . COVID-19 virus detected 12/30/2019   10/30/2019-SARS-CoV-2-positive Didn't require hospitalization    . Depression   . Diabetes mellitus without complication (HCC)   . Fatty liver   . Goiter   . Hashimoto's thyroiditis   . Hilar adenopathy 03/20/19  04/11/19   CTA CHEST and PET per DR. Anne Ng LEWIS  . History of kidney stones   . Hypertension   . Mediastinal adenopathy    PER CTA CHEST PET SCAN per DR. Lanae Crumbly LEWIS  . Pneumonia   . Supraclavicular adenopathy 03/20/2019   PER CTA CHEST and PET per DR. Conchita Paris    Past Surgical History:  Procedure Laterality Date  . ABDOMINAL HYSTERECTOMY     COMPLETE  . ADHESIOLYSIS    . APPENDECTOMY    . CESAREAN SECTION    . MEDIASTINOSCOPY N/A 04/18/2019   Procedure: MEDIASTINOSCOPY;  Surgeon: Delight Ovens, MD;  Location: [facility] OR;  Service: Thoracic;  Laterality: N/A;  . VIDEO BRONCHOSCOPY WITH ENDOBRONCHIAL ULTRASOUND N/A 04/18/2019   Procedure: VIDEO BRONCHOSCOPY WITH ENDOBRONCHIAL ULTRASOUND;  Surgeon: Delight Ovens, MD;  Location: [facility] OR;  Service: Thoracic;  Laterality: N/A;    Family History  Problem Relation Age of Onset  . Anuerysm Mother        BRAIN  . Cancer Mother        BREAST  . COPD Father   . Hyperlipidemia Father   . Congestive Heart Failure Father   . Bipolar disorder Sister   . Diabetes Sister    . Fibromyalgia Sister   . Cancer Paternal Uncle        LUNG  . Cancer Maternal Grandmother 46       BREAST  . Cancer Paternal Grandfather        LUNG    Social History   Socioeconomic History  . Marital status: Divorced    Spouse name: Not on file  . Number of children: Not on file  . Years of education: Not on file  . Highest education level: Not on file  Occupational History  . Not on file  Tobacco Use  . Smoking status: Never Smoker  . Smokeless tobacco: Never Used  Vaping Use  . Vaping Use: Never used  Substance and Sexual Activity  . Alcohol use: Yes    Comment: VERY RARE OCCASION  . Drug use: Never  . Sexual activity: Not on file  Other Topics Concern  . Not on file  Social History Narrative  . Not on file   Social Determinants of Health   Financial Resource Strain:   . Difficulty of Paying Living Expenses: Not on file  Food Insecurity:   . Worried About Programme researcher, broadcasting/film/video in the Last Year: Not on file  . Ran Out of Food in the Last Year: Not on file  Transportation Needs:   . Lack of Transportation (Medical):  Not on file  . Lack of Transportation (Non-Medical): Not on file  Physical Activity:   . Days of Exercise per Week: Not on file  . Minutes of Exercise per Session: Not on file  Stress:   . Feeling of Stress : Not on file  Social Connections:   . Frequency of Communication with Friends and Family: Not on file  . Frequency of Social Gatherings with Friends and Family: Not on file  . Attends Religious Services: Not on file  . Active Member of Clubs or Organizations: Not on file  . Attends Banker Meetings: Not on file  . Marital Status: Not on file  Intimate Partner Violence:   . Fear of Current or Ex-Partner: Not on file  . Emotionally Abused: Not on file  . Physically Abused: Not on file  . Sexually Abused: Not on file    Outpatient Medications Prior to Visit  Medication Sig Dispense Refill  . albuterol (VENTOLIN HFA) 108 (90  Base) MCG/ACT inhaler Inhale 2 puffs into the lungs every 6 (six) hours as needed for wheezing or shortness of breath. 18 g 2  . amoxicillin (AMOXIL) 875 MG tablet Take 1 tablet (875 mg total) by mouth 2 (two) times daily. 20 tablet 0  . atenolol-chlorthalidone (TENORETIC) 50-25 MG tablet Take 1 tablet by mouth daily.     Marland Kitchen atorvastatin (LIPITOR) 80 MG tablet Take 1 tablet (80 mg total) by mouth daily. 90 tablet 0  . budesonide-formoterol (SYMBICORT) 160-4.5 MCG/ACT inhaler Inhale 2 puffs into the lungs 2 (two) times daily. 1 Inhaler 5  . buPROPion (WELLBUTRIN XL) 300 MG 24 hr tablet Take 1 tablet (300 mg total) by mouth daily. 90 tablet 0  . famotidine (PEPCID) 40 MG tablet TAKE 1 TABLET BY MOUTH TWICE A DAY 180 tablet 1  . gabapentin (NEURONTIN) 300 MG capsule TAKE 1 CAPSULE BY MOUTH THREE TIMES A DAY 270 capsule 1  . ibuprofen (ADVIL) 600 MG tablet Take 1 tablet (600 mg total) by mouth every 6 (six) hours as needed. 30 tablet 0  . irbesartan (AVAPRO) 75 MG tablet Take 1 tablet (75 mg total) by mouth daily. 90 tablet 0  . levothyroxine (SYNTHROID) 25 MCG tablet Take 1 tablet (25 mcg total) by mouth daily before breakfast. 90 tablet 3  . metFORMIN (GLUCOPHAGE) 1000 MG tablet Take 1 tablet (1,000 mg total) by mouth 2 (two) times daily with a meal. Please call for a fasting follow up appointment. Thank you, Dr. Sedalia Muta 180 tablet 0  . traZODone (DESYREL) 100 MG tablet Take 100 mg by mouth at bedtime as needed for sleep.     Marland Kitchen venlafaxine XR (EFFEXOR-XR) 75 MG 24 hr capsule Take 300 mg by mouth daily. Takes 4 (75mg ) capsules daily=300mg     . guaiFENesin-codeine 100-10 MG/5ML syrup Take 5 mLs by mouth 3 (three) times daily as needed for cough. (Patient not taking: Reported on 08/31/2020) 120 mL 0   No facility-administered medications prior to visit.    Allergies  Allergen Reactions  . Avelox [Moxifloxacin Hcl In Nacl]   . Cymbalta [Duloxetine Hcl]   . Oxycodone-Acetaminophen     ROS Review of  Systems  Respiratory: Positive for cough. Negative for wheezing.   Cardiovascular: Negative for chest pain.  Endocrine: Negative for polyuria.  Genitourinary: Negative for difficulty urinating and dysuria.  Musculoskeletal: Negative for gait problem.  Skin: Negative for rash.      Objective:    Physical Exam Constitutional:  Appearance: Normal appearance.  Cardiovascular:     Rate and Rhythm: Normal rate and regular rhythm.     Pulses: Normal pulses.     Heart sounds: Normal heart sounds.  Pulmonary:     Effort: Pulmonary effort is normal.     Comments: Decrease BS bilat Musculoskeletal:     Cervical back: Normal range of motion and neck supple.  Neurological:     Mental Status: She is alert.     BP 126/68   Pulse 84   Temp 98.2 F (36.8 C)   Resp 16   Ht 5' 10.5" (1.791 m)   Wt 294 lb 6.4 oz (133.5 kg)   SpO2 96%   BMI 41.64 kg/m  Wt Readings from Last 3 Encounters:  08/31/20 294 lb 6.4 oz (133.5 kg)  08/23/20 285 lb (129.3 kg)  08/02/20 287 lb 6.4 oz (130.4 kg)     Health Maintenance Due  Topic Date Due  . Hepatitis C Screening  Never done  . FOOT EXAM  Never done  . OPHTHALMOLOGY EXAM  Never done  . HIV Screening  Never done  . TETANUS/TDAP  Never done  . PAP SMEAR-Modifier  Never done  . MAMMOGRAM  Never done  . COLONOSCOPY  Never done    There are no preventive care reminders to display for this patient.  Lab Results  Component Value Date   TSH 3.030 08/02/2020   Lab Results  Component Value Date   WBC 7.5 08/02/2020   HGB 14.3 08/02/2020   HCT 43.3 08/02/2020   MCV 92 08/02/2020   PLT 292 08/02/2020   Lab Results  Component Value Date   NA 141 08/02/2020   K 4.6 08/02/2020   CO2 24 08/02/2020   GLUCOSE 132 (H) 08/02/2020   BUN 11 08/02/2020   CREATININE 0.77 08/02/2020   BILITOT 0.2 08/02/2020   ALKPHOS 91 08/02/2020   AST 26 08/02/2020   ALT 26 08/02/2020   PROT 7.3 08/02/2020   ALBUMIN 4.3 08/02/2020   CALCIUM 9.4  08/02/2020   ANIONGAP 11 04/16/2019   GFR 71.87 12/31/2019   Lab Results  Component Value Date   CHOL 217 (H) 08/02/2020   Lab Results  Component Value Date   HDL 42 08/02/2020   Lab Results  Component Value Date   LDLCALC 142 (H) 08/02/2020   Lab Results  Component Value Date   TRIG 183 (H) 08/02/2020   Lab Results  Component Value Date   CHOLHDL 5.2 (H) 08/02/2020   Lab Results  Component Value Date   HGBA1C 7.0 (H) 08/02/2020      Assessment & Plan:  1. Cough Chest xray d/w concern for h/o sarcoid and no recent chest xay-prednisone-10mg -taper, doxy-rx - Ambulatory referral to Pulmonology Use albuterol as needed  q 4-6 hours, continue mucinex DM 2. Sarcoidosis - Ambulatory referral to Pulmonology-dw pt needs regular follow up Sat monitor Follow-up: pulmonary   Tobe Kervin Mat Carne, MD

## 2020-08-31 NOTE — Patient Instructions (Signed)
Cough, Adult A cough helps to clear your throat and lungs. A cough may be a sign of an illness or another medical condition. An acute cough may only last 2-3 weeks, while a chronic cough may last 8 or more weeks. Many things can cause a cough. They include:  Germs (viruses or bacteria) that attack the airway.  Breathing in things that bother (irritate) your lungs.  Allergies.  Asthma.  Mucus that runs down the back of your throat (postnasal drip).  Smoking.  Acid backing up from the stomach into the tube that moves food from the mouth to the stomach (gastroesophageal reflux).  Some medicines.  Lung problems.  Other medical conditions, such as heart failure or a blood clot in the lung (pulmonary embolism). Follow these instructions at home: Medicines  Take over-the-counter and prescription medicines only as told by your doctor.  Talk with your doctor before you take medicines that stop a cough (coughsuppressants). Lifestyle   Do not smoke, and try not to be around smoke. Do not use any products that contain nicotine or tobacco, such as cigarettes, e-cigarettes, and chewing tobacco. If you need help quitting, ask your doctor.  Drink enough fluid to keep your pee (urine) pale yellow.  Avoid caffeine.  Do not drink alcohol if your doctor tells you not to drink. General instructions   Watch for any changes in your cough. Tell your doctor about them.  Always cover your mouth when you cough.  Stay away from things that make you cough, such as perfume, candles, campfire smoke, or cleaning products.  If the air is dry, use a cool mist vaporizer or humidifier in your home.  If your cough is worse at night, try using extra pillows to raise your head up higher while you sleep.  Rest as needed.  Keep all follow-up visits as told by your doctor. This is important. Contact a doctor if:  You have new symptoms.  You cough up pus.  Your cough does not get better after 2-3  weeks, or your cough gets worse.  Cough medicine does not help your cough and you are not sleeping well.  You have pain that gets worse or pain that is not helped with medicine.  You have a fever.  You are losing weight and you do not know why.  You have night sweats. Get help right away if:  You cough up blood.  You have trouble breathing.  Your heartbeat is very fast. These symptoms may be an emergency. Do not wait to see if the symptoms will go away. Get medical help right away. Call your local emergency services (911 in the [place] Do not drive yourself to the hospital. Summary  A cough helps to clear your throat and lungs. Many things can cause a cough.  Take over-the-counter and prescription medicines only as told by your doctor.  Always cover your mouth when you cough.  Contact a doctor if you have new symptoms or you have a cough that does not get better or gets worse. This information is not intended to replace advice given to you by your health care provider. Make sure you discuss any questions you have with your health care provider. Document Revised: 10/28/2018 Document Reviewed: 10/28/2018 Elsevier Patient Education  2020 [employer]  chest xray Pick up medications-prednisone and doxy at pharmacy Continue mucinex DM, fluids We will make you an appointment to see pulmonary for follow up

## 2020-09-01 ENCOUNTER — Telehealth: Payer: Self-pay

## 2020-09-01 NOTE — Telephone Encounter (Signed)
Holly [name] called with questions about her dosing on the prednisone.  Dr. Judee Clara recommended 60 mg today.  3 tablets po bid x 2 days, 2 po bid x 3 days, 1 po bid x 3 days, then 1 daily x 3 days.

## 2020-09-03 ENCOUNTER — Encounter: Payer: Self-pay | Admitting: Family Medicine

## 2020-09-07 ENCOUNTER — Other Ambulatory Visit: Payer: Self-pay

## 2020-09-07 DIAGNOSIS — D869 Sarcoidosis, unspecified: Secondary | ICD-10-CM

## 2020-09-10 ENCOUNTER — Ambulatory Visit (INDEPENDENT_AMBULATORY_CARE_PROVIDER_SITE_OTHER): Payer: BC Managed Care – PPO | Admitting: Nurse Practitioner

## 2020-09-10 ENCOUNTER — Encounter: Payer: Self-pay | Admitting: Nurse Practitioner

## 2020-09-10 ENCOUNTER — Other Ambulatory Visit: Payer: Self-pay

## 2020-09-10 VITALS — BP 148/76 | Temp 97.6°F | Ht 70.5 in | Wt 288.0 lb

## 2020-09-10 DIAGNOSIS — E1142 Type 2 diabetes mellitus with diabetic polyneuropathy: Secondary | ICD-10-CM | POA: Diagnosis not present

## 2020-09-10 DIAGNOSIS — J0100 Acute maxillary sinusitis, unspecified: Secondary | ICD-10-CM | POA: Diagnosis not present

## 2020-09-10 DIAGNOSIS — D869 Sarcoidosis, unspecified: Secondary | ICD-10-CM

## 2020-09-10 DIAGNOSIS — I10 Essential (primary) hypertension: Secondary | ICD-10-CM

## 2020-09-10 DIAGNOSIS — E1121 Type 2 diabetes mellitus with diabetic nephropathy: Secondary | ICD-10-CM

## 2020-09-10 NOTE — Patient Instructions (Signed)
Begin Zyrtec 10 mg daily for allergies Use Nasal saline spray Continue antibiotics, steroids, and Flonase Notify office if symptoms fail to improve or worsen   Sinusitis, Adult Sinusitis is soreness and swelling (inflammation) of your sinuses. Sinuses are hollow spaces in the bones around your face. They are located:  Around your eyes.  In the middle of your forehead.  Behind your nose.  In your cheekbones. Your sinuses and nasal passages are lined with a fluid called mucus. Mucus drains out of your sinuses. Swelling can trap mucus in your sinuses. This lets germs (bacteria, virus, or fungus) grow, which leads to infection. Most of the time, this condition is caused by a virus. What are the causes? This condition is caused by:  Allergies.  Asthma.  Germs.  Things that block your nose or sinuses.  Growths in the nose (nasal polyps).  Chemicals or irritants in the air.  Fungus (rare). What increases the risk? You are more likely to develop this condition if:  You have a weak body defense system (immune system).  You do a lot of swimming or diving.  You use nasal sprays too much.  You smoke. What are the signs or symptoms? The main symptoms of this condition are pain and a feeling of pressure around the sinuses. Other symptoms include:  Stuffy nose (congestion).  Runny nose (drainage).  Swelling and warmth in the sinuses.  Headache.  Toothache.  A cough that may get worse at night.  Mucus that collects in the throat or the back of the nose (postnasal drip).  Being unable to smell and taste.  Being very tired (fatigue).  A fever.  Sore throat.  Bad breath. How is this diagnosed? This condition is diagnosed based on:  Your symptoms.  Your medical history.  A physical exam.  Tests to find out if your condition is short-term (acute) or long-term (chronic). Your doctor may: ? Check your nose for growths (polyps). ? Check your sinuses using a tool  that has a light (endoscope). ? Check for allergies or germs. ? Do imaging tests, such as an MRI or CT scan. How is this treated? Treatment for this condition depends on the cause and whether it is short-term or long-term.  If caused by a virus, your symptoms should go away on their own within 10 days. You may be given medicines to relieve symptoms. They include: ? Medicines that shrink swollen tissue in the nose. ? Medicines that treat allergies (antihistamines). ? A spray that treats swelling of the nostrils. ? Rinses that help get rid of thick mucus in your nose (nasal saline washes).  If caused by bacteria, your doctor may wait to see if you will get better without treatment. You may be given antibiotic medicine if you have: ? A very bad infection. ? A weak body defense system.  If caused by growths in the nose, you may need to have surgery. Follow these instructions at home: Medicines  Take, use, or apply over-the-counter and prescription medicines only as told by your doctor. These may include nasal sprays.  If you were prescribed an antibiotic medicine, take it as told by your doctor. Do not stop taking the antibiotic even if you start to feel better. Hydrate and humidify   Drink enough water to keep your pee (urine) pale yellow.  Use a cool mist humidifier to keep the humidity level in your home above 50%.  Breathe in steam for 10-15 minutes, 3-4 times a day, or as told  by your doctor. You can do this in the bathroom while a hot shower is running.  Try not to spend time in cool or dry air. Rest  Rest as much as you can.  Sleep with your head raised (elevated).  Make sure you get enough sleep each night. General instructions   Put a warm, moist washcloth on your face 3-4 times a day, or as often as told by your doctor. This will help with discomfort.  Wash your hands often with soap and water. If there is no soap and water, use hand sanitizer.  Do not smoke.  Avoid being around people who are smoking (secondhand smoke).  Keep all follow-up visits as told by your doctor. This is important. Contact a doctor if:  You have a fever.  Your symptoms get worse.  Your symptoms do not get better within 10 days. Get help right away if:  You have a very bad headache.  You cannot stop throwing up (vomiting).  You have very bad pain or swelling around your face or eyes.  You have trouble seeing.  You feel confused.  Your neck is stiff.  You have trouble breathing. Summary  Sinusitis is swelling of your sinuses. Sinuses are hollow spaces in the bones around your face.  This condition is caused by tissues in your nose that become inflamed or swollen. This traps germs. These can lead to infection.  If you were prescribed an antibiotic medicine, take it as told by your doctor. Do not stop taking it even if you start to feel better.  Keep all follow-up visits as told by your doctor. This is important. This information is not intended to replace advice given to you by your health care provider. Make sure you discuss any questions you have with your health care provider. Document Revised: 03/11/2018 Document Reviewed: 03/11/2018 Elsevier Patient Education  2020 [employer].

## 2020-09-10 NOTE — Progress Notes (Signed)
Acute Office Visit  Subjective:    Patient ID: Holly [name], female    DOB: 12/11/1965, 54 y.o.   MRN: [id number]  Chief Complaint  Patient presents with  . Sinuses not better    HPI Patient is in today for evaluation of sinusitis. Symptoms include sinus congestion, pressure, bilateral ear fullness, and headache.She denies fever, N&V, chest pain, or dyspnea. Onset of symptoms was 3 weeks ago. She is taking a second round of antibiotics (Doxycyline) and prednisone. Additional treatment of symptoms include Flonase nasal spray, Mucinex, and Ibuprofen.Past medical history includes sarcoidosis,Type 2 DM, allergic rhinitis, and thyroid disease.   Past Medical History:  Diagnosis Date  . Anxiety   . COVID-19 virus detected 12/30/2019   10/30/2019-SARS-CoV-2-positive Didn't require hospitalization    . Depression   . Diabetes mellitus without complication (HCC)   . Fatty liver   . Goiter   . Hashimoto's thyroiditis   . Hilar adenopathy 03/20/19  04/11/19   CTA CHEST and PET per DR. Anne NgEQUINCY LEWIS  . History of kidney stones   . Hypertension   . Mediastinal adenopathy    PER CTA CHEST PET SCAN per DR. Lanae CrumblyeQINCY LEWIS  . Pneumonia   . Supraclavicular adenopathy 03/20/2019   PER CTA CHEST and PET per DR. Conchita ParisEQUNCY LEWIS    Past Surgical History:  Procedure Laterality Date  . ABDOMINAL HYSTERECTOMY     COMPLETE  . ADHESIOLYSIS    . APPENDECTOMY    . CESAREAN SECTION    . MEDIASTINOSCOPY N/A 04/18/2019   Procedure: MEDIASTINOSCOPY;  Surgeon: Delight OvensGerhardt, Edward B, MD;  [facility];  Service: Thoracic;  Laterality: N/A;  . VIDEO BRONCHOSCOPY WITH ENDOBRONCHIAL ULTRASOUND N/A 04/18/2019   Procedure: VIDEO BRONCHOSCOPY WITH ENDOBRONCHIAL ULTRASOUND;  Surgeon: Delight OvensGerhardt, Edward B, MD;  [facility];  Service: Thoracic;  Laterality: N/A;    Family History  Problem Relation Age of Onset  . Anuerysm Mother        BRAIN  . Cancer Mother        BREAST  . COPD Father   . Hyperlipidemia  Father   . Congestive Heart Failure Father   . Bipolar disorder Sister   . Diabetes Sister   . Fibromyalgia Sister   . Cancer Paternal Uncle        LUNG  . Cancer Maternal Grandmother 46       BREAST  . Cancer Paternal Grandfather        LUNG    Social History   Socioeconomic History  . Marital status: Divorced    Spouse name: Not on file  . Number of children: Not on file  . Years of education: Not on file  . Highest education level: Not on file  Occupational History  . Not on file  Tobacco Use  . Smoking status: Never Smoker  . Smokeless tobacco: Never Used  Vaping Use  . Vaping Use: Never used  Substance and Sexual Activity  . Alcohol use: Yes    Comment: VERY RARE OCCASION  . Drug use: Never  . Sexual activity: Not on file  Other Topics Concern  . Not on file  Social History Narrative  . Not on file   Social Determinants of Health   Financial Resource Strain:   . Difficulty of Paying Living Expenses: Not on file  Food Insecurity:   . Worried About Programme researcher, broadcasting/film/videounning Out of Food in the Last Year: Not on file  . Ran Out of Food in the Last Year:  Not on file  Transportation Needs:   . Lack of Transportation (Medical): Not on file  . Lack of Transportation (Non-Medical): Not on file  Physical Activity:   . Days of Exercise per Week: Not on file  . Minutes of Exercise per Session: Not on file  Stress:   . Feeling of Stress : Not on file  Social Connections:   . Frequency of Communication with Friends and Family: Not on file  . Frequency of Social Gatherings with Friends and Family: Not on file  . Attends Religious Services: Not on file  . Active Member of Clubs or Organizations: Not on file  . Attends Banker Meetings: Not on file  . Marital Status: Not on file  Intimate Partner Violence:   . Fear of Current or Ex-Partner: Not on file  . Emotionally Abused: Not on file  . Physically Abused: Not on file  . Sexually Abused: Not on file    Outpatient  Medications Prior to Visit  Medication Sig Dispense Refill  . albuterol (VENTOLIN HFA) 108 (90 Base) MCG/ACT inhaler Inhale 2 puffs into the lungs every 6 (six) hours as needed for wheezing or shortness of breath. 18 g 2  . atenolol-chlorthalidone (TENORETIC) 50-25 MG tablet Take 1 tablet by mouth daily.     Marland Kitchen atorvastatin (LIPITOR) 80 MG tablet Take 1 tablet (80 mg total) by mouth daily. 90 tablet 0  . budesonide-formoterol (SYMBICORT) 160-4.5 MCG/ACT inhaler Inhale 2 puffs into the lungs 2 (two) times daily. 1 Inhaler 5  . buPROPion (WELLBUTRIN XL) 300 MG 24 hr tablet Take 1 tablet (300 mg total) by mouth daily. 90 tablet 0  . doxycycline (VIBRA-TABS) 100 MG tablet Take 1 tablet (100 mg total) by mouth 2 (two) times daily. 20 tablet 0  . famotidine (PEPCID) 40 MG tablet TAKE 1 TABLET BY MOUTH TWICE A DAY 180 tablet 1  . gabapentin (NEURONTIN) 300 MG capsule TAKE 1 CAPSULE BY MOUTH THREE TIMES A DAY 270 capsule 1  . guaiFENesin-codeine 100-10 MG/5ML syrup Take 5 mLs by mouth 3 (three) times daily as needed for cough. 120 mL 0  . ibuprofen (ADVIL) 600 MG tablet Take 1 tablet (600 mg total) by mouth every 6 (six) hours as needed. 30 tablet 0  . irbesartan (AVAPRO) 75 MG tablet Take 1 tablet (75 mg total) by mouth daily. 90 tablet 0  . levothyroxine (SYNTHROID) 25 MCG tablet Take 1 tablet (25 mcg total) by mouth daily before breakfast. 90 tablet 3  . metFORMIN (GLUCOPHAGE) 1000 MG tablet Take 1 tablet (1,000 mg total) by mouth 2 (two) times daily with a meal. Please call for a fasting follow up appointment. Thank you, Dr. Sedalia Muta 180 tablet 0  . predniSONE (DELTASONE) 10 MG tablet Take 1 tablet (10 mg total) by mouth daily with breakfast. 39 tablet 0  . traZODone (DESYREL) 100 MG tablet Take 100 mg by mouth at bedtime as needed for sleep.     Marland Kitchen venlafaxine XR (EFFEXOR-XR) 75 MG 24 hr capsule Take 300 mg by mouth daily. Takes 4 (75mg ) capsules daily=300mg     . amoxicillin (AMOXIL) 875 MG tablet Take 1  tablet (875 mg total) by mouth 2 (two) times daily. 20 tablet 0   No facility-administered medications prior to visit.    Allergies  Allergen Reactions  . Avelox [Moxifloxacin Hcl In Nacl]   . Cymbalta [Duloxetine Hcl]   . Oxycodone-Acetaminophen     Review of Systems  Constitutional: Negative for fatigue and fever.  HENT: Positive for congestion, sinus pressure and sinus pain. Negative for ear pain and sore throat.   Eyes: Negative for pain.  Respiratory: Negative for cough, chest tightness, shortness of breath and wheezing.   Cardiovascular: Negative for chest pain and palpitations.  Gastrointestinal: Negative for abdominal pain, constipation, diarrhea, nausea and vomiting.  Genitourinary: Negative for dysuria and hematuria.  Musculoskeletal: Negative for arthralgias, back pain, joint swelling and myalgias.  Skin: Negative for rash.  Neurological: Positive for headaches. Negative for dizziness and weakness.  Psychiatric/Behavioral: Negative for dysphoric mood. The patient is not nervous/anxious.        Objective:    Physical Exam Vitals reviewed.  Constitutional:      Appearance: Normal appearance.  HENT:     Head: Normocephalic.     Right Ear: Tympanic membrane, ear canal and external ear normal.     Left Ear: Tympanic membrane, ear canal and external ear normal.     Nose: Nasal tenderness, mucosal edema and congestion present.     Right Turbinates: Enlarged and swollen.     Left Turbinates: Enlarged and swollen.     Right Sinus: Maxillary sinus tenderness and frontal sinus tenderness present.     Left Sinus: Maxillary sinus tenderness and frontal sinus tenderness present.     Mouth/Throat:     Mouth: Mucous membranes are moist.  Cardiovascular:     Rate and Rhythm: Normal rate and regular rhythm.     Pulses: Normal pulses.     Heart sounds: Normal heart sounds.  Pulmonary:     Effort: Pulmonary effort is normal.     Breath sounds: Normal breath sounds.    Abdominal:     Palpations: Abdomen is soft.  Musculoskeletal:        General: Normal range of motion.     Cervical back: Neck supple.  Skin:    General: Skin is warm and dry.  Neurological:     Mental Status: She is alert and oriented to person, place, and time.  Psychiatric:        Mood and Affect: Mood normal.        Behavior: Behavior normal.        Thought Content: Thought content normal.        Judgment: Judgment normal.     BP (!) 148/76 (BP Location: Left Arm, Patient Position: Sitting)   Temp 97.6 F (36.4 C) (Temporal)   Wt 288 lb (130.6 kg)   SpO2 96%   BMI 40.74 kg/m  Wt Readings from Last 3 Encounters:  09/10/20 288 lb (130.6 kg)  08/31/20 294 lb 6.4 oz (133.5 kg)  08/23/20 285 lb (129.3 kg)    Health Maintenance Due  Topic Date Due  . Hepatitis C Screening  Never done  . FOOT EXAM  Never done  . OPHTHALMOLOGY EXAM  Never done  . HIV Screening  Never done  . TETANUS/TDAP  Never done  . PAP SMEAR-Modifier  Never done  . MAMMOGRAM  Never done  . COLONOSCOPY  Never done    There are no preventive care reminders to display for this patient.   Lab Results  Component Value Date   TSH 3.030 08/02/2020   Lab Results  Component Value Date   WBC 7.5 08/02/2020   HGB 14.3 08/02/2020   HCT 43.3 08/02/2020   MCV 92 08/02/2020   PLT 292 08/02/2020   Lab Results  Component Value Date   NA 141 08/02/2020   K 4.6 08/02/2020   CO2  24 08/02/2020   GLUCOSE 132 (H) 08/02/2020   BUN 11 08/02/2020   CREATININE 0.77 08/02/2020   BILITOT 0.2 08/02/2020   ALKPHOS 91 08/02/2020   AST 26 08/02/2020   ALT 26 08/02/2020   PROT 7.3 08/02/2020   ALBUMIN 4.3 08/02/2020   CALCIUM 9.4 08/02/2020   ANIONGAP 11 04/16/2019   GFR 71.87 12/31/2019   Lab Results  Component Value Date   CHOL 217 (H) 08/02/2020   Lab Results  Component Value Date   HDL 42 08/02/2020   Lab Results  Component Value Date   LDLCALC 142 (H) 08/02/2020   Lab Results  Component  Value Date   TRIG 183 (H) 08/02/2020   Lab Results  Component Value Date   CHOLHDL 5.2 (H) 08/02/2020   Lab Results  Component Value Date   HGBA1C 7.0 (H) 08/02/2020         Assessment & Plan:   1. Acute non-recurrent maxillary sinusitis  Continue Doxycycline and Prednisone  Push fluids and rest  Continue Flonase and Mucinex  Add nasal saline and Zyrtec daily to treat symptoms    Begin Zyrtec 10 mg daily for allergies Use Nasal saline spray Continue antibiotics, steroids, and Flonase Notify office if symptoms fail to improve or worsen   Follow-up: PRN  Flonnie Hailstone, DNP [facility] [place], [place]  [phone]

## 2020-09-15 ENCOUNTER — Other Ambulatory Visit: Payer: Self-pay | Admitting: Family Medicine

## 2020-09-15 DIAGNOSIS — F331 Major depressive disorder, recurrent, moderate: Secondary | ICD-10-CM

## 2020-09-15 DIAGNOSIS — I10 Essential (primary) hypertension: Secondary | ICD-10-CM

## 2020-09-20 ENCOUNTER — Ambulatory Visit: Payer: BC Managed Care – PPO | Admitting: Pulmonary Disease

## 2020-09-21 ENCOUNTER — Other Ambulatory Visit: Payer: Self-pay

## 2020-09-21 ENCOUNTER — Ambulatory Visit (INDEPENDENT_AMBULATORY_CARE_PROVIDER_SITE_OTHER): Payer: BC Managed Care – PPO | Admitting: Family Medicine

## 2020-09-21 VITALS — BP 110/70 | HR 96 | Temp 97.5°F | Resp 18 | Ht 70.5 in | Wt 296.2 lb

## 2020-09-21 DIAGNOSIS — J3089 Other allergic rhinitis: Secondary | ICD-10-CM

## 2020-09-21 DIAGNOSIS — S39012A Strain of muscle, fascia and tendon of lower back, initial encounter: Secondary | ICD-10-CM

## 2020-09-21 MED ORDER — CYCLOBENZAPRINE HCL 5 MG PO TABS: 5.0000 mg | ORAL_TABLET | Freq: Three times a day (TID) | ORAL | 0 refills | Status: DC | PRN

## 2020-09-21 NOTE — Patient Instructions (Signed)
Flexeril 5 mg one three times a day as needed for muscle spasm. Continue zyrtec (ceterizine) 10 mg once at night for sinus/allergy symptoms.   Low Back Sprain or Strain Rehab Ask your health care provider which exercises are safe for you. Do exercises exactly as told by your health care provider and adjust them as directed. It is normal to feel mild stretching, pulling, tightness, or discomfort as you do these exercises. Stop right away if you feel sudden pain or your pain gets worse. Do not begin these exercises until told by your health care provider. Stretching and range-of-motion exercises These exercises warm up your muscles and joints and improve the movement and flexibility of your back. These exercises also help to relieve pain, numbness, and tingling. Lumbar rotation  1. Lie on your back on a firm surface and bend your knees. 2. Straighten your arms out to your sides so each arm forms a 90-degree angle (right angle) with a side of your body. 3. Slowly move (rotate) both of your knees to one side of your body until you feel a stretch in your lower back (lumbar). Try not to let your shoulders lift off the floor. 4. Hold this position for __________ seconds. 5. Tense your abdominal muscles and slowly move your knees back to the starting position. 6. Repeat this exercise on the other side of your body. Repeat __________ times. Complete this exercise __________ times a day. Single knee to chest  1. Lie on your back on a firm surface with both legs straight. 2. Bend one of your knees. Use your hands to move your knee up toward your chest until you feel a gentle stretch in your lower back and buttock. ? Hold your leg in this position by holding on to the front of your knee. ? Keep your other leg as straight as possible. 3. Hold this position for __________ seconds. 4. Slowly return to the starting position. 5. Repeat with your other leg. Repeat __________ times. Complete this exercise  __________ times a day. Prone extension on elbows  1. Lie on your abdomen on a firm surface (prone position). 2. Prop yourself up on your elbows. 3. Use your arms to help lift your chest up until you feel a gentle stretch in your abdomen and your lower back. ? This will place some of your body weight on your elbows. If this is uncomfortable, try stacking pillows under your chest. ? Your hips should stay down, against the surface that you are lying on. Keep your hip and back muscles relaxed. 4. Hold this position for __________ seconds. 5. Slowly relax your upper body and return to the starting position. Repeat __________ times. Complete this exercise __________ times a day. Strengthening exercises These exercises build strength and endurance in your back. Endurance is the ability to use your muscles for a long time, even after they get tired. Pelvic tilt This exercise strengthens the muscles that lie deep in the abdomen. 1. Lie on your back on a firm surface. Bend your knees and keep your feet flat on the floor. 2. Tense your abdominal muscles. Tip your pelvis up toward the ceiling and flatten your lower back into the floor. ? To help with this exercise, you may place a small towel under your lower back and try to push your back into the towel. 3. Hold this position for __________ seconds. 4. Let your muscles relax completely before you repeat this exercise. Repeat __________ times. Complete this exercise __________ times a  day. Alternating arm and leg raises  1. Get on your hands and knees on a firm surface. If you are on a hard floor, you may want to use padding, such as an exercise mat, to cushion your knees. 2. Line up your arms and legs. Your hands should be directly below your shoulders, and your knees should be directly below your hips. 3. Lift your left leg behind you. At the same time, raise your right arm and straighten it in front of you. ? Do not lift your leg higher than your  hip. ? Do not lift your arm higher than your shoulder. ? Keep your abdominal and back muscles tight. ? Keep your hips facing the ground. ? Do not arch your back. ? Keep your balance carefully, and do not hold your breath. 4. Hold this position for __________ seconds. 5. Slowly return to the starting position. 6. Repeat with your right leg and your left arm. Repeat __________ times. Complete this exercise __________ times a day. Abdominal set with straight leg raise  1. Lie on your back on a firm surface. 2. Bend one of your knees and keep your other leg straight. 3. Tense your abdominal muscles and lift your straight leg up, 4-6 inches (10-15 cm) off the ground. 4. Keep your abdominal muscles tight and hold this position for __________ seconds. ? Do not hold your breath. ? Do not arch your back. Keep it flat against the ground. 5. Keep your abdominal muscles tense as you slowly lower your leg back to the starting position. 6. Repeat with your other leg. Repeat __________ times. Complete this exercise __________ times a day. Single leg lower with bent knees 1. Lie on your back on a firm surface. 2. Tense your abdominal muscles and lift your feet off the floor, one foot at a time, so your knees and hips are bent in 90-degree angles (right angles). ? Your knees should be over your hips and your lower legs should be parallel to the floor. 3. Keeping your abdominal muscles tense and your knee bent, slowly lower one of your legs so your toe touches the ground. 4. Lift your leg back up to return to the starting position. ? Do not hold your breath. ? Do not let your back arch. Keep your back flat against the ground. 5. Repeat with your other leg. Repeat __________ times. Complete this exercise __________ times a day. Posture and body mechanics Good posture and healthy body mechanics can help to relieve stress in your body's tissues and joints. Body mechanics refers to the movements and  positions of your body while you do your daily activities. Posture is part of body mechanics. Good posture means:  Your spine is in its natural S-curve position (neutral).  Your shoulders are pulled back slightly.  Your head is not tipped forward. Follow these guidelines to improve your posture and body mechanics in your everyday activities. Standing   When standing, keep your spine neutral and your feet about hip width apart. Keep a slight bend in your knees. Your ears, shoulders, and hips should line up.  When you do a task in which you stand in one place for a long time, place one foot up on a stable object that is 2-4 inches (5-10 cm) high, such as a footstool. This helps keep your spine neutral. Sitting   When sitting, keep your spine neutral and keep your feet flat on the floor. Use a footrest, if necessary, and keep your thighs  parallel to the floor. Avoid rounding your shoulders, and avoid tilting your head forward.  When working at a desk or a computer, keep your desk at a height where your hands are slightly lower than your elbows. Slide your chair under your desk so you are close enough to maintain good posture.  When working at a computer, place your monitor at a height where you are looking straight ahead and you do not have to tilt your head forward or downward to look at the screen. Resting  When lying down and resting, avoid positions that are most painful for you.  If you have pain with activities such as sitting, bending, stooping, or squatting, lie in a position in which your body does not bend very much. For example, avoid curling up on your side with your arms and knees near your chest (fetal position).  If you have pain with activities such as standing for a long time or reaching with your arms, lie with your spine in a neutral position and bend your knees slightly. Try the following positions: ? Lying on your side with a pillow between your knees. ? Lying on your  back with a pillow under your knees. Lifting   When lifting objects, keep your feet at least shoulder width apart and tighten your abdominal muscles.  Bend your knees and hips and keep your spine neutral. It is important to lift using the strength of your legs, not your back. Do not lock your knees straight out.  Always ask for help to lift heavy or awkward objects. This information is not intended to replace advice given to you by your health care provider. Make sure you discuss any questions you have with your health care provider. Document Revised: 01/31/2019 Document Reviewed: 10/31/2018 Elsevier Patient Education  2020 [employer].

## 2020-09-21 NOTE — Progress Notes (Addendum)
Acute Office Visit  Subjective:    Patient ID: Holly [name], female    DOB: Jun 07, 1966, 54 y.o.   MRN: [id number]  Chief Complaint  Patient presents with  . back pain x 2 weeks    HPI Patient is in today for 2 weeks of back pain. Pt was loading feed and twisted and had sudden pain in her mid back. No radicular symptoms. Pt is having back spasms. Pt seeing chiropractor, Dr. Victory Dakin. NO help. Taking ibuprofen 200 mg 4 pills maybe daily. Has tried ice/heat - no help. No tylenol tried. Has tried skelaxin, flexeril with back injuries.  Patient has been treated for sinusitis with 3 courses of antibiotics and steroids over the last 2 months. The last treatment was 10 days ago. She does feel like she is improved but it has not fully resolved. She was also started on Zyrtec at her last visit. She denies sinus headache but rather congestion.  Past Medical History:  Diagnosis Date  . Anxiety   . COVID-19 virus detected 12/30/2019   10/30/2019-SARS-CoV-2-positive Didn't require hospitalization    . Depression   . Diabetes mellitus without complication (HCC)   . Fatty liver   . Goiter   . Hashimoto's thyroiditis   . Hilar adenopathy 03/20/19  04/11/19   CTA CHEST and PET per DR. Anne Ng LEWIS  . History of kidney stones   . Hypertension   . Mediastinal adenopathy    PER CTA CHEST PET SCAN per DR. Lanae Crumbly LEWIS  . Pneumonia   . Supraclavicular adenopathy 03/20/2019   PER CTA CHEST and PET per DR. Conchita Paris    Past Surgical History:  Procedure Laterality Date  . ABDOMINAL HYSTERECTOMY     COMPLETE  . ADHESIOLYSIS    . APPENDECTOMY    . CESAREAN SECTION    . MEDIASTINOSCOPY N/A 04/18/2019   Procedure: MEDIASTINOSCOPY;  Surgeon: Delight Ovens, MD;  Location: [facility] OR;  Service: Thoracic;  Laterality: N/A;  . VIDEO BRONCHOSCOPY WITH ENDOBRONCHIAL ULTRASOUND N/A 04/18/2019   Procedure: VIDEO BRONCHOSCOPY WITH ENDOBRONCHIAL ULTRASOUND;  Surgeon: Delight Ovens, MD;  Location: [facility] OR;   Service: Thoracic;  Laterality: N/A;    Family History  Problem Relation Age of Onset  . Anuerysm Mother        BRAIN  . Cancer Mother        BREAST  . COPD Father   . Hyperlipidemia Father   . Congestive Heart Failure Father   . Bipolar disorder Sister   . Diabetes Sister   . Fibromyalgia Sister   . Cancer Paternal Uncle        LUNG  . Cancer Maternal Grandmother 46       BREAST  . Cancer Paternal Grandfather        LUNG    Social History   Socioeconomic History  . Marital status: Divorced    Spouse name: Not on file  . Number of children: Not on file  . Years of education: Not on file  . Highest education level: Not on file  Occupational History  . Not on file  Tobacco Use  . Smoking status: Never Smoker  . Smokeless tobacco: Never Used  Vaping Use  . Vaping Use: Never used  Substance and Sexual Activity  . Alcohol use: Yes    Comment: VERY RARE OCCASION  . Drug use: Never  . Sexual activity: Not on file  Other Topics Concern  . Not on file  Social History Narrative  .  Not on file   Social Determinants of Health   Financial Resource Strain:   . Difficulty of Paying Living Expenses: Not on file  Food Insecurity:   . Worried About Programme researcher, broadcasting/film/video in the Last Year: Not on file  . Ran Out of Food in the Last Year: Not on file  Transportation Needs:   . Lack of Transportation (Medical): Not on file  . Lack of Transportation (Non-Medical): Not on file  Physical Activity:   . Days of Exercise per Week: Not on file  . Minutes of Exercise per Session: Not on file  Stress:   . Feeling of Stress : Not on file  Social Connections:   . Frequency of Communication with Friends and Family: Not on file  . Frequency of Social Gatherings with Friends and Family: Not on file  . Attends Religious Services: Not on file  . Active Member of Clubs or Organizations: Not on file  . Attends Banker Meetings: Not on file  . Marital Status: Not on file    Intimate Partner Violence:   . Fear of Current or Ex-Partner: Not on file  . Emotionally Abused: Not on file  . Physically Abused: Not on file  . Sexually Abused: Not on file    Outpatient Medications Prior to Visit  Medication Sig Dispense Refill  . albuterol (VENTOLIN HFA) 108 (90 Base) MCG/ACT inhaler Inhale 2 puffs into the lungs every 6 (six) hours as needed for wheezing or shortness of breath. 18 g 2  . atenolol-chlorthalidone (TENORETIC) 50-25 MG tablet Take 1 tablet by mouth daily.     Marland Kitchen atorvastatin (LIPITOR) 80 MG tablet TAKE 1 TABLET BY MOUTH EVERY DAY 90 tablet 0  . budesonide-formoterol (SYMBICORT) 160-4.5 MCG/ACT inhaler Inhale 2 puffs into the lungs 2 (two) times daily. 1 Inhaler 5  . buPROPion (WELLBUTRIN XL) 300 MG 24 hr tablet TAKE 1 TABLET BY MOUTH EVERY DAY 90 tablet 0  . famotidine (PEPCID) 40 MG tablet TAKE 1 TABLET BY MOUTH TWICE A DAY 180 tablet 1  . gabapentin (NEURONTIN) 300 MG capsule TAKE 1 CAPSULE BY MOUTH THREE TIMES A DAY 270 capsule 1  . ibuprofen (ADVIL) 600 MG tablet Take 1 tablet (600 mg total) by mouth every 6 (six) hours as needed. 30 tablet 0  . irbesartan (AVAPRO) 75 MG tablet TAKE 1 TABLET BY MOUTH EVERY DAY 90 tablet 0  . levothyroxine (SYNTHROID) 25 MCG tablet Take 1 tablet (25 mcg total) by mouth daily before breakfast. 90 tablet 3  . metFORMIN (GLUCOPHAGE) 1000 MG tablet Take 1 tablet (1,000 mg total) by mouth 2 (two) times daily with a meal. Please call for a fasting follow up appointment. Thank you, Dr. Sedalia Muta 180 tablet 0  . traZODone (DESYREL) 100 MG tablet Take 100 mg by mouth at bedtime as needed for sleep.     Marland Kitchen venlafaxine XR (EFFEXOR-XR) 75 MG 24 hr capsule Take 300 mg by mouth daily. Takes 4 (75mg ) capsules daily=300mg     . doxycycline (VIBRA-TABS) 100 MG tablet Take 1 tablet (100 mg total) by mouth 2 (two) times daily. 20 tablet 0  . guaiFENesin-codeine 100-10 MG/5ML syrup Take 5 mLs by mouth 3 (three) times daily as needed for cough. 120  mL 0  . predniSONE (DELTASONE) 10 MG tablet Take 1 tablet (10 mg total) by mouth daily with breakfast. 39 tablet 0   No facility-administered medications prior to visit.    Allergies  Allergen Reactions  . Avelox [Moxifloxacin  Hcl In Nacl]   . Cymbalta [Duloxetine Hcl]   . Oxycodone-Acetaminophen     Review of Systems  Constitutional: Positive for fatigue. Negative for chills and fever.  HENT: Positive for congestion and rhinorrhea. Negative for sore throat.   Respiratory: Positive for cough and shortness of breath (sarcoidosis).   Cardiovascular: Negative for chest pain and palpitations.  Gastrointestinal: Negative for abdominal pain, constipation, diarrhea, nausea and vomiting.  Genitourinary: Negative for dysuria and urgency.  Musculoskeletal: Positive for back pain (mid back pain ) and myalgias.  Neurological: Positive for headaches. Negative for dizziness, weakness and light-headedness.  Psychiatric/Behavioral: Negative for dysphoric mood. The patient is not nervous/anxious.        Objective:    Physical Exam Vitals reviewed.  Constitutional:      Appearance: Normal appearance. She is obese.  HENT:     Right Ear: Tympanic membrane normal.     Left Ear: Tympanic membrane normal.     Nose: Congestion and rhinorrhea (Pale edema of turbinates bilaterally) present.     Mouth/Throat:     Mouth: Mucous membranes are moist.     Pharynx: No oropharyngeal exudate or posterior oropharyngeal erythema.  Cardiovascular:     Rate and Rhythm: Normal rate and regular rhythm.     Heart sounds: Normal heart sounds.  Pulmonary:     Effort: Pulmonary effort is normal.     Breath sounds: Normal breath sounds.  Musculoskeletal:        General: Tenderness (Left upper lumbar. Spasm. Rest of back is nontender. Decreased range of motion with lateral bending and rotation.) present.  Neurological:     Mental Status: She is alert.     BP 110/70   Pulse 96   Temp (!) 97.5 F (36.4 C)    Resp 18   Ht 5' 10.5" (1.791 m)   Wt 296 lb 3.2 oz (134.4 kg)   BMI 41.90 kg/m  Wt Readings from Last 3 Encounters:  09/21/20 296 lb 3.2 oz (134.4 kg)  09/10/20 288 lb (130.6 kg)  08/31/20 294 lb 6.4 oz (133.5 kg)    Health Maintenance Due  Topic Date Due  . Hepatitis C Screening  Never done  . FOOT EXAM  Never done  . OPHTHALMOLOGY EXAM  Never done  . HIV Screening  Never done  . TETANUS/TDAP  Never done  . PAP SMEAR-Modifier  Never done  . MAMMOGRAM  Never done  . COLONOSCOPY  Never done    There are no preventive care reminders to display for this patient.   Lab Results  Component Value Date   TSH 3.030 08/02/2020   Lab Results  Component Value Date   WBC 7.5 08/02/2020   HGB 14.3 08/02/2020   HCT 43.3 08/02/2020   MCV 92 08/02/2020   PLT 292 08/02/2020   Lab Results  Component Value Date   NA 141 08/02/2020   K 4.6 08/02/2020   CO2 24 08/02/2020   GLUCOSE 132 (H) 08/02/2020   BUN 11 08/02/2020   CREATININE 0.77 08/02/2020   BILITOT 0.2 08/02/2020   ALKPHOS 91 08/02/2020   AST 26 08/02/2020   ALT 26 08/02/2020   PROT 7.3 08/02/2020   ALBUMIN 4.3 08/02/2020   CALCIUM 9.4 08/02/2020   ANIONGAP 11 04/16/2019   GFR 71.87 12/31/2019   Lab Results  Component Value Date   CHOL 217 (H) 08/02/2020   Lab Results  Component Value Date   HDL 42 08/02/2020   Lab Results  Component Value  Date   LDLCALC 142 (H) 08/02/2020   Lab Results  Component Value Date   TRIG 183 (H) 08/02/2020   Lab Results  Component Value Date   CHOLHDL 5.2 (H) 08/02/2020   Lab Results  Component Value Date   HGBA1C 7.0 (H) 08/02/2020       Assessment & Plan:  1. Strain of lumbar region, initial encounter Exercises given. Patient may also take ibuprofen OTC up to 4 pills 3 times a day. Really does seem like muscle spasms so hopefully the cyclobenzaprine will help. Continue ice or heat whichever helps most. - cyclobenzaprine (FLEXERIL) 5 MG tablet; Take 1 tablet (5 mg  total) by mouth 3 (three) times daily as needed for muscle spasms.  Dispense: 40 tablet; Refill: 0  2. Allergic rhinitis versus chronic sinusitis. Recommend patient continue OTC Zyrtec 10 mg once at night. If persistent symptoms patient to to call back and will refer to ENT.  Meds ordered this encounter  Medications  . cyclobenzaprine (FLEXERIL) 5 MG tablet    Sig: Take 1 tablet (5 mg total) by mouth 3 (three) times daily as needed for muscle spasms.    Dispense:  40 tablet    Refill:  0      Follow-up: Return if symptoms worsen or fail to improve. If not improving, patient may call and we'll refer to physical therapy. At this point I do not feel she needs an x-ray. An After Visit Summary was printed and given to the patient.  Blane Ohara, MD [facility] [phone]

## 2020-09-22 ENCOUNTER — Encounter: Payer: Self-pay | Admitting: Family Medicine

## 2020-09-28 ENCOUNTER — Ambulatory Visit: Payer: BC Managed Care – PPO | Admitting: Pulmonary Disease

## 2020-09-28 ENCOUNTER — Other Ambulatory Visit: Payer: Self-pay

## 2020-09-28 ENCOUNTER — Encounter: Payer: Self-pay | Admitting: Pulmonary Disease

## 2020-09-28 VITALS — BP 112/64 | HR 92 | Temp 97.8°F | Ht 70.5 in | Wt 296.8 lb

## 2020-09-28 DIAGNOSIS — Z9189 Other specified personal risk factors, not elsewhere classified: Secondary | ICD-10-CM | POA: Diagnosis not present

## 2020-09-28 DIAGNOSIS — D869 Sarcoidosis, unspecified: Secondary | ICD-10-CM

## 2020-09-28 DIAGNOSIS — J301 Allergic rhinitis due to pollen: Secondary | ICD-10-CM | POA: Diagnosis not present

## 2020-09-28 HISTORY — DX: Other specified personal risk factors, not elsewhere classified: Z91.89

## 2020-09-28 MED ORDER — ALBUTEROL SULFATE HFA 108 (90 BASE) MCG/ACT IN AERS: 2.0000 | INHALATION_SPRAY | Freq: Four times a day (QID) | RESPIRATORY_TRACT | 2 refills | Status: DC | PRN

## 2020-09-28 MED ORDER — BUDESONIDE-FORMOTEROL FUMARATE 160-4.5 MCG/ACT IN AERO: 2.0000 | INHALATION_SPRAY | Freq: Two times a day (BID) | RESPIRATORY_TRACT | 3 refills | Status: DC

## 2020-09-28 NOTE — Patient Instructions (Addendum)
You were seen today by Coral Ceo, NP  for:   1. Sarcoidosis  Continue Symbicort 160 >>> 2 puffs in the morning right when you wake up, rinse out your mouth after use, 12 hours later 2 puffs, rinse after use >>> Take this daily, no matter what >>> This is not a rescue inhaler   Only use your albuterol as a rescue medication to be used if you can't catch your breath by resting or doing a relaxed purse lip breathing pattern.  - The less you use it, the better it will work when you need it. - Ok to use up to 2 puffs  every 4 hours if you must but call for immediate appointment if use goes up over your usual need - Don't leave home without it !!  (think of it like the spare tire for your car)    2. Allergic rhinitis due to pollen, unspecified seasonality  Continue daily Zyrtec  Continue Nasonex  Please start taking chlorpheniramine (aka Chlor tabs) 4 mg tablet (1 to 2 tablets at night) for management of allergies and postnasal drip at night >>> This is an over-the-counter medication >>> This medication is sedating  Start nasal saline rinses twice daily Use distilled water Shake well Get bottle lukewarm like a baby bottle  3. At risk for obstructive sleep apnea  - Home sleep test; Future     We recommend today:  Orders Placed This Encounter  Procedures  . Home sleep test    Standing Status:   Future    Standing Expiration Date:   09/28/2021    Order Specific Question:   Where should this test be performed:    Answer:   [facility]   Orders Placed This Encounter  Procedures  . Home sleep test   No orders of the defined types were placed in this encounter.   Follow Up:    Return in about 6 weeks (around 11/09/2020), or if symptoms worsen or fail to improve, for Follow up with Dr. Delton Coombes.   Notification of test results are managed in the following manner: If there are  any recommendations or changes to the  plan of care discussed in office today,  we will contact  you and let you know what they are. If you do not hear from [place], then your results are normal and you can view them through your  MyChart account , or a letter will be sent to you. Thank you again for trusting [place] with your care  - Thank you, [facility]    It is flu season:   >>> Best ways to protect herself from the flu: Receive the yearly flu vaccine, practice good hand hygiene washing with soap and also using hand sanitizer when available, eat a nutritious meals, get adequate rest, hydrate appropriately       Please contact the office if your symptoms worsen or you have concerns that you are not improving.   Thank you for choosing [facility] for your healthcare, and for allowing [place] to partner with you on your healthcare journey. I am thankful to be able to provide care to you today.   Elisha Headland FNP-C

## 2020-09-28 NOTE — Assessment & Plan Note (Signed)
MP 3  Prev history of mild osa BMI 41  Snoring   Plan:  HST ordered

## 2020-09-28 NOTE — Assessment & Plan Note (Signed)
Plan: We will continue to clinically monitor Can consider repeat pulmonary function testing or high-resolution CT chest imaging after next office visit Continue Symbicort 160

## 2020-09-28 NOTE — Assessment & Plan Note (Signed)
Plan: Continue daily antihistamine Start taking chlor tabs at night Start nasal saline rinse Continue Nasonex

## 2020-09-28 NOTE — Assessment & Plan Note (Signed)
Plan: We will order home sleep study Continue to work on reducing BMI

## 2020-09-28 NOTE — Progress Notes (Signed)
@Patient  ID: Holly [name], female    DOB: 13-Dec-1965, 54 y.o.   MRN: [id number]  Chief Complaint  Patient presents with  . Follow-up    shortness of breath with activity    Referring provider: Blane Oharaox, Kirsten, MD  HPI:  54 year old female never smoker followed in our office for sarcoidosis  Past medical history: Type 2 diabetes, depression, anxiety, hypertension, status post COVID-19 infection Smoking history: Never smoker Maintenance: Symbicort 160 Patient Dr. Delton CoombesByrum  09/28/2020  - Visit   54 year old female never smoker followed in our office for sarcoidosis.  Established with Dr. Delton CoombesByrum.  She was last seen by Dr. Delton CoombesByrum in April/2021.  This was a virtual visit.  Is felt at that time that some of her shortness of breath and dyspnea was related to sarcoid.  A high-resolution CT chest was ordered.  It was recommended that she have a follow-up office visit after obtaining the high-resolution CT chest.  She is also encouraged to stop lisinopril and start irbesartan due to her cough.  Patient never completed high-resolution CT chest and never completed follow-up. Most recent chest imaging was from November/2021 I do not have these images but I can see the radiology reading that was felt to be stable.  Patient reports that she had a sinus infection 2 months ago.  She had 2 rounds of antibiotics.  1 of those antibiotics being Augmentin.  She is unsure what the other antibiotic was.  She also received a prednisone taper.  She reports that she still is "improving from this"  She is had worsening shortness of breath over the last week which coincides with when she ran out of Symbicort 160.  She is asking for refills of this.  She reports adherence to Zyrtec, Nasonex as well as saline rinse spray.  She is not doing full Nettie pot or spray bottles for volume.  We will discuss this today.  Patient has received COVID-19 vaccinations but is due for the booster.   Tests:   05/05/2019-ACE level-48   04/18/2019-chest x-ray-no demonstrated complication from procedure, grossly stable mediastinal adenopathy  05/26/2019-pulmonary function test-FVC 3.51 (80% predicted), postbronchodilator ratio 74, postbronchodilator FEV1 2.59 (75% predicted), no bronchodilator response, DLCO 24.86 (96% predicted)  05/13/2019-high-resolution CT chest-multiple subtle central peribronchovascular nodularity nodularity along the fissures, a few scattered small pulmonary nodules measuring up to 4 mm in left lower lobe, pleural calcification and posterior right hemothorax, findings in keeping with diagnosis of sarcoid, enlarged pulmonary trunk indicative of PAH  09/01/2020-chest x-ray-stable exam, no acute process, stable hilar adenopathy consistent with known sarcoid  FENO:  No results found for: NITRICOXIDE  PFT: PFT Results Latest Ref Rng & Units 05/26/2019  FVC-Pre L 3.51  FVC-Predicted Pre % 80  FVC-Post L 3.49  FVC-Predicted Post % 80  Pre FEV1/FVC % % 74  Post FEV1/FCV % % 74  FEV1-Pre L 2.60  FEV1-Predicted Pre % 76  FEV1-Post L 2.59  DLCO uncorrected ml/min/mmHg 24.86  DLCO UNC% % 96  DLVA Predicted % 123    WALK:  SIX MIN WALK 12/31/2019  Supplimental Oxygen during Test? (L/min) No  Tech Comments: Patient was able to complete 1 lap at a moderate pace. She had to stop at 3/4 of a lap due to feeling SOB and lightheaded. Her O2 was at 98%, HR 120 when she stopped. She was able to complete her lap. No O2 needed during or after walk. Patient felt better once she sat down.    Imaging: No results found.  Lab Results:  CBC    Component Value Date/Time   WBC 7.5 08/02/2020 0939   WBC 6.2 12/31/2019 1013   RBC 4.73 08/02/2020 0939   RBC 4.07 12/31/2019 1013   HGB 14.3 08/02/2020 0939   HCT 43.3 08/02/2020 0939   PLT 292 08/02/2020 0939   MCV 92 08/02/2020 0939   MCH 30.2 08/02/2020 0939   MCH 30.1 04/16/2019 1450   MCHC 33.0 08/02/2020 0939   MCHC 34.3 12/31/2019 1013   RDW 12.4 08/02/2020  0939   LYMPHSABS 2.2 08/02/2020 0939   MONOABS 0.4 12/31/2019 1013   EOSABS 0.2 08/02/2020 0939   BASOSABS 0.1 08/02/2020 0939    BMET    Component Value Date/Time   NA 141 08/02/2020 0939   K 4.6 08/02/2020 0939   CL 102 08/02/2020 0939   CO2 24 08/02/2020 0939   GLUCOSE 132 (H) 08/02/2020 0939   GLUCOSE 314 (H) 12/31/2019 1013   BUN 11 08/02/2020 0939   CREATININE 0.77 08/02/2020 0939   CALCIUM 9.4 08/02/2020 0939   GFRNONAA 88 08/02/2020 0939   GFRAA 102 08/02/2020 0939    BNP No results found for: BNP  ProBNP    Component Value Date/Time   PROBNP 39.0 12/31/2019 1013    Specialty Problems      Pulmonary Problems   Hilar adenopathy    CTA CHEST and PET      Allergic rhinitis   Cough   Acute sinusitis      Allergies  Allergen Reactions  . Avelox [Moxifloxacin Hcl In Nacl]   . Cymbalta [Duloxetine Hcl]   . Oxycodone-Acetaminophen     Immunization History  Administered Date(s) Administered  . Influenza Inj Mdck Quad Pf 08/02/2020  . Influenza,inj,Quad PF,6+ Mos 07/18/2018  . Influenza-Unspecified 07/11/2016, 07/24/2017, 09/03/2018  . Moderna SARS-COVID-2 Vaccination 12/22/2019, 01/19/2020  . Pneumococcal Polysaccharide-23 08/02/2020    Past Medical History:  Diagnosis Date  . Anxiety   . COVID-19 virus detected 12/30/2019   10/30/2019-SARS-CoV-2-positive Didn't require hospitalization    . Depression   . Diabetes mellitus without complication (HCC)   . Fatty liver   . Goiter   . Hashimoto's thyroiditis   . Hilar adenopathy 03/20/19  04/11/19   CTA CHEST and PET per DR. Anne Ng [facility]  . History of kidney stones   . Hypertension   . Mediastinal adenopathy    PER CTA CHEST PET SCAN per DR. Lanae Crumbly LEW[facility]  . Pneumonia   . Supraclavicular adenopathy 03/20/2019   PER CTA CHEST and PET per DR. Sudie Bailey LEW[facility]    Tobacco History: Social History   Tobacco Use  Smoking Status Never Smoker  Smokeless Tobacco Never Used   Counseling given: Not  Answered   Continue to not smoke  Outpatient Encounter Medications as of 09/28/2020  Medication Sig  . albuterol (VENTOLIN HFA) 108 (90 Base) MCG/ACT inhaler Inhale 2 puffs into the lungs every 6 (six) hours as needed for wheezing or shortness of breath.  Marland Kitchen atenolol-chlorthalidone (TENORETIC) 50-25 MG tablet Take 1 tablet by mouth daily.   Marland Kitchen atorvastatin (LIPITOR) 80 MG tablet TAKE 1 TABLET BY MOUTH EVERY DAY  . budesonide-formoterol (SYMBICORT) 160-4.5 MCG/ACT inhaler Inhale 2 puffs into the lungs 2 (two) times daily.  Marland Kitchen buPROPion (WELLBUTRIN XL) 300 MG 24 hr tablet TAKE 1 TABLET BY MOUTH EVERY DAY  . cyclobenzaprine (FLEXERIL) 5 MG tablet Take 1 tablet (5 mg total) by mouth 3 (three) times daily as needed for muscle spasms.  . famotidine (PEPCID) 40 MG  tablet TAKE 1 TABLET BY MOUTH TWICE A DAY  . gabapentin (NEURONTIN) 300 MG capsule TAKE 1 CAPSULE BY MOUTH THREE TIMES A DAY  . ibuprofen (ADVIL) 600 MG tablet Take 1 tablet (600 mg total) by mouth every 6 (six) hours as needed.  . irbesartan (AVAPRO) 75 MG tablet TAKE 1 TABLET BY MOUTH EVERY DAY  . levothyroxine (SYNTHROID) 25 MCG tablet Take 1 tablet (25 mcg total) by mouth daily before breakfast.  . metFORMIN (GLUCOPHAGE) 1000 MG tablet Take 1 tablet (1,000 mg total) by mouth 2 (two) times daily with a meal. Please call for a fasting follow up appointment. Thank you, Dr. Sedalia Muta  . traZODone (DESYREL) 100 MG tablet Take 100 mg by mouth at bedtime as needed for sleep.   Marland Kitchen venlafaxine XR (EFFEXOR-XR) 75 MG 24 hr capsule Take 300 mg by mouth daily. Takes 4 (75mg ) capsules daily=300mg   . [DISCONTINUED] albuterol (VENTOLIN HFA) 108 (90 Base) MCG/ACT inhaler Inhale 2 puffs into the lungs every 6 (six) hours as needed for wheezing or shortness of breath.  . [DISCONTINUED] budesonide-formoterol (SYMBICORT) 160-4.5 MCG/ACT inhaler Inhale 2 puffs into the lungs 2 (two) times daily.   No facility-administered encounter medications on file as of 09/28/2020.      Review of Systems  Review of Systems  Constitutional: Negative for activity change, fatigue and fever.  HENT: Positive for congestion (clear ) and rhinorrhea. Negative for sinus pressure, sinus pain and sore throat.   Respiratory: Positive for shortness of breath. Negative for cough and wheezing.   Cardiovascular: Negative for chest pain and palpitations.  Gastrointestinal: Negative for diarrhea, nausea and vomiting.  Musculoskeletal: Negative for arthralgias.  Neurological: Negative for dizziness.  Psychiatric/Behavioral: Negative for sleep disturbance. The patient is not nervous/anxious.      Physical Exam  BP 112/64 (BP Location: Left Arm, Cuff Size: Normal)   Pulse 92   Temp 97.8 F (36.6 C) (Other (Comment)) Comment (Src): wrist  Ht 5' 10.5" (1.791 m)   Wt 296 lb 12.8 oz (134.6 kg)   SpO2 96% Comment: Room air  BMI 41.98 kg/m   Wt Readings from Last 5 Encounters:  09/28/20 296 lb 12.8 oz (134.6 kg)  09/21/20 296 lb 3.2 oz (134.4 kg)  09/10/20 288 lb (130.6 kg)  08/31/20 294 lb 6.4 oz (133.5 kg)  08/23/20 285 lb (129.3 kg)    BMI Readings from Last 5 Encounters:  09/28/20 41.98 kg/m  09/21/20 41.90 kg/m  09/10/20 40.74 kg/m  08/31/20 41.64 kg/m  08/23/20 40.32 kg/m     Physical Exam Vitals and nursing note reviewed.  Constitutional:      General: She is not in acute distress.    Appearance: Normal appearance. She is obese.  HENT:     Head: Normocephalic and atraumatic.     Right Ear: External ear normal.     Left Ear: External ear normal.     Nose: Rhinorrhea present. No congestion.     Mouth/Throat:     Mouth: Mucous membranes are moist.     Pharynx: Oropharynx is clear.     Comments: PND Eyes:     Pupils: Pupils are equal, round, and reactive to light.  Cardiovascular:     Rate and Rhythm: Normal rate and regular rhythm.     Pulses: Normal pulses.     Heart sounds: Normal heart sounds. No murmur heard.   Pulmonary:     Effort: No  respiratory distress.     Breath sounds: No decreased air movement.  No decreased breath sounds, wheezing or rales.  Musculoskeletal:     Cervical back: Normal range of motion.  Skin:    General: Skin is warm and dry.     Capillary Refill: Capillary refill takes less than 2 seconds.  Neurological:     General: No focal deficit present.     Mental Status: She is alert and oriented to person, place, and time. Mental status is at baseline.     Gait: Gait normal.  Psychiatric:        Mood and Affect: Mood normal.        Behavior: Behavior normal.        Thought Content: Thought content normal.        Judgment: Judgment normal.       Assessment & Plan:   Allergic rhinitis Plan: Continue daily antihistamine Start taking chlor tabs at night Start nasal saline rinse Continue Nasonex  At risk for obstructive sleep apnea MP 3  Prev history of mild osa BMI 41  Snoring   Plan:  HST ordered   Morbid obesity (HCC) Plan: We will order home sleep study Continue to work on reducing BMI  Sarcoidosis Plan: We will continue to clinically monitor Can consider repeat pulmonary function testing or high-resolution CT chest imaging after next office visit Continue Symbicort 160     Return in about 6 weeks (around 11/09/2020), or if symptoms worsen or fail to improve, for Follow up with Dr. Delton Coombes.   Coral Ceo, NP 09/28/2020   This appointment required 32 minutes of patient care (this includes precharting, chart review, review of results, face-to-face care, etc.).

## 2020-10-18 ENCOUNTER — Encounter: Payer: BC Managed Care – PPO | Admitting: Family Medicine

## 2020-10-29 ENCOUNTER — Telehealth: Payer: Self-pay

## 2020-10-29 ENCOUNTER — Other Ambulatory Visit: Payer: Self-pay

## 2020-10-29 DIAGNOSIS — G4733 Obstructive sleep apnea (adult) (pediatric): Secondary | ICD-10-CM | POA: Diagnosis not present

## 2020-10-29 DIAGNOSIS — Z9189 Other specified personal risk factors, not elsewhere classified: Secondary | ICD-10-CM

## 2020-10-29 NOTE — Telephone Encounter (Signed)
Holly [name] called and left a message that she was having swelling in her legs. She wanted [place] to recommend something she could use otc. I called back and left a message for patient to avoid salt, elevate legs, compression hose and hydrate well with water.  She was instructed to make an appointment to follow-up with Dr. Sedalia Muta.

## 2020-11-02 ENCOUNTER — Telehealth: Payer: Self-pay | Admitting: Emergency Medicine

## 2020-11-02 NOTE — Telephone Encounter (Signed)
Patient is going to bring the machine in, set it down by the sliding glass doors & call me once she has dropped it off.

## 2020-11-05 ENCOUNTER — Telehealth (INDEPENDENT_AMBULATORY_CARE_PROVIDER_SITE_OTHER): Payer: BC Managed Care – PPO | Admitting: Family Medicine

## 2020-11-05 ENCOUNTER — Encounter: Payer: Self-pay | Admitting: Family Medicine

## 2020-11-05 VITALS — Temp 97.6°F | Ht 71.0 in | Wt 300.0 lb

## 2020-11-05 DIAGNOSIS — H9202 Otalgia, left ear: Secondary | ICD-10-CM

## 2020-11-05 DIAGNOSIS — E1142 Type 2 diabetes mellitus with diabetic polyneuropathy: Secondary | ICD-10-CM | POA: Diagnosis not present

## 2020-11-05 DIAGNOSIS — D869 Sarcoidosis, unspecified: Secondary | ICD-10-CM

## 2020-11-05 DIAGNOSIS — U071 COVID-19: Secondary | ICD-10-CM

## 2020-11-05 DIAGNOSIS — J40 Bronchitis, not specified as acute or chronic: Secondary | ICD-10-CM

## 2020-11-05 MED ORDER — AMOXICILLIN 875 MG PO TABS: 875.0000 mg | ORAL_TABLET | Freq: Two times a day (BID) | ORAL | 0 refills | Status: DC

## 2020-11-05 NOTE — Progress Notes (Addendum)
Virtual Visit via Telephone Note   This visit type was conducted due to national recommendations for restrictions regarding the COVID-19 Pandemic (e.g. social distancing) in an effort to limit this patient's exposure and mitigate transmission in our community.  Due to her co-morbid illnesses, this patient is at least at moderate risk for complications without adequate follow up.  This format is felt to be most appropriate for this patient at this time.  The patient did not have access to video technology/had technical difficulties with video requiring transitioning to audio format only (telephone).  All issues noted in this document were discussed and addressed.  No physical exam could be performed with this format.  Patient verbally consented to a telehealth visit.   Date:  11/05/2020   ID:  Holly [name], DOB 1966/07/09, MRN [id number]  Patient Location: Home Provider Location: Office/Clinic  PCP:  Blane Ohara, MD   Evaluation Performed: acute  Chief Complaint:  cough  History of Present Illness:    Holly [name] is a 55 y.o. female with  Cough, fatigue, congestion, chills, sweats, and diarrhea. Has sob and chest pain since Tuesday. Pt had first 2 covid vaccinations. Pt had covid 19 positive at [facility] Found in Epic everywhere. Pt is at high risk for complications because has diabetes and sarcoidosis.   The patient does have symptoms concerning for COVID-19 infection (fever, chills, cough, or new shortness of breath).    Past Medical History:  Diagnosis Date  . Anxiety   . COVID-19 virus detected 12/30/2019   10/30/2019-SARS-CoV-2-positive Didn't require hospitalization    . Depression   . Diabetes mellitus without complication (HCC)   . Fatty liver   . Goiter   . Hashimoto's thyroiditis   . Hilar adenopathy 03/20/19  04/11/19   CTA CHEST and PET per DR. Anne Ng LEWIS  . History of kidney stones   . Hypertension   . Mediastinal adenopathy    PER CTA CHEST PET SCAN per DR.  Lanae Crumbly LEWIS  . Pneumonia   . Supraclavicular adenopathy 03/20/2019   PER CTA CHEST and PET per DR. Conchita Paris    Past Surgical History:  Procedure Laterality Date  . ABDOMINAL HYSTERECTOMY     COMPLETE  . ADHESIOLYSIS    . APPENDECTOMY    . CESAREAN SECTION    . MEDIASTINOSCOPY N/A 04/18/2019   Procedure: MEDIASTINOSCOPY;  Surgeon: Delight Ovens, MD;  Location: [facility] OR;  Service: Thoracic;  Laterality: N/A;  . VIDEO BRONCHOSCOPY WITH ENDOBRONCHIAL ULTRASOUND N/A 04/18/2019   Procedure: VIDEO BRONCHOSCOPY WITH ENDOBRONCHIAL ULTRASOUND;  Surgeon: Delight Ovens, MD;  Location: [facility] OR;  Service: Thoracic;  Laterality: N/A;    Family History  Problem Relation Age of Onset  . Anuerysm Mother        BRAIN  . Cancer Mother        BREAST  . COPD Father   . Hyperlipidemia Father   . Congestive Heart Failure Father   . Bipolar disorder Sister   . Diabetes Sister   . Fibromyalgia Sister   . Cancer Paternal Uncle        LUNG  . Cancer Maternal Grandmother 46       BREAST  . Cancer Paternal Grandfather        LUNG    Social History   Socioeconomic History  . Marital status: Divorced    Spouse name: Not on file  . Number of children: Not on file  . Years of education: Not on  file  . Highest education level: Not on file  Occupational History  . Not on file  Tobacco Use  . Smoking status: Never Smoker  . Smokeless tobacco: Never Used  Vaping Use  . Vaping Use: Never used  Substance and Sexual Activity  . Alcohol use: Yes    Comment: VERY RARE OCCASION  . Drug use: Never  . Sexual activity: Not on file  Other Topics Concern  . Not on file  Social History Narrative  . Not on file   Social Determinants of Health   Financial Resource Strain: Not on file  Food Insecurity: Not on file  Transportation Needs: Not on file  Physical Activity: Not on file  Stress: Not on file  Social Connections: Not on file  Intimate Partner Violence: Not on file     Outpatient Medications Prior to Visit  Medication Sig Dispense Refill  . albuterol (VENTOLIN HFA) 108 (90 Base) MCG/ACT inhaler Inhale 2 puffs into the lungs every 6 (six) hours as needed for wheezing or shortness of breath. 18 g 2  . atenolol-chlorthalidone (TENORETIC) 50-25 MG tablet Take 1 tablet by mouth daily.     Marland Kitchen atorvastatin (LIPITOR) 80 MG tablet TAKE 1 TABLET BY MOUTH EVERY DAY 90 tablet 0  . budesonide-formoterol (SYMBICORT) 160-4.5 MCG/ACT inhaler Inhale 2 puffs into the lungs 2 (two) times daily. 1 each 3  . buPROPion (WELLBUTRIN XL) 300 MG 24 hr tablet TAKE 1 TABLET BY MOUTH EVERY DAY 90 tablet 0  . cyclobenzaprine (FLEXERIL) 5 MG tablet Take 1 tablet (5 mg total) by mouth 3 (three) times daily as needed for muscle spasms. 40 tablet 0  . famotidine (PEPCID) 40 MG tablet TAKE 1 TABLET BY MOUTH TWICE A DAY 180 tablet 1  . gabapentin (NEURONTIN) 300 MG capsule TAKE 1 CAPSULE BY MOUTH THREE TIMES A DAY 270 capsule 1  . ibuprofen (ADVIL) 600 MG tablet Take 1 tablet (600 mg total) by mouth every 6 (six) hours as needed. 30 tablet 0  . irbesartan (AVAPRO) 75 MG tablet TAKE 1 TABLET BY MOUTH EVERY DAY 90 tablet 0  . levothyroxine (SYNTHROID) 25 MCG tablet Take 1 tablet (25 mcg total) by mouth daily before breakfast. 90 tablet 3  . metFORMIN (GLUCOPHAGE) 1000 MG tablet Take 1 tablet (1,000 mg total) by mouth 2 (two) times daily with a meal. Please call for a fasting follow up appointment. Thank you, Dr. Sedalia Muta 180 tablet 0  . traZODone (DESYREL) 100 MG tablet Take 100 mg by mouth at bedtime as needed for sleep.     Marland Kitchen venlafaxine XR (EFFEXOR-XR) 75 MG 24 hr capsule Take 300 mg by mouth daily. Takes 4 (75mg ) capsules daily=300mg      No facility-administered medications prior to visit.    Allergies:   Avelox [moxifloxacin hcl in nacl], Cymbalta [duloxetine hcl], and Oxycodone-acetaminophen   Social History   Tobacco Use  . Smoking status: Never Smoker  . Smokeless tobacco: Never Used   Vaping Use  . Vaping Use: Never used  Substance Use Topics  . Alcohol use: Yes    Comment: VERY RARE OCCASION  . Drug use: Never     Review of Systems  Constitutional: Positive for fever and malaise/fatigue.  HENT: Positive for congestion.   Respiratory: Positive for cough.   Gastrointestinal: Positive for diarrhea.     Labs/Other Tests and Data Reviewed:    Recent Labs: 12/31/2019: Pro B Natriuretic peptide (BNP) 39.0 08/02/2020: ALT 26; BUN 11; Creatinine, Ser 0.77; Hemoglobin 14.3;  Platelets 292; Potassium 4.6; Sodium 141; TSH 3.030   Recent Lipid Panel Lab Results  Component Value Date/Time   CHOL 217 (H) 08/02/2020 09:39 AM   TRIG 183 (H) 08/02/2020 09:39 AM   HDL 42 08/02/2020 09:39 AM   CHOLHDL 5.2 (H) 08/02/2020 09:39 AM   LDLCALC 142 (H) 08/02/2020 09:39 AM    Wt Readings from Last 3 Encounters:  11/05/20 300 lb (136.1 kg)  09/28/20 296 lb 12.8 oz (134.6 kg)  09/21/20 296 lb 3.2 oz (134.4 kg)     Objective:    Vital Signs:  Temp 97.6 F (36.4 C)   Ht 5\' 11"  (1.803 m)   Wt 300 lb (136.1 kg)   BMI 41.84 kg/m    Physical Exam   ASSESSMENT & PLAN:   1. Bronchitis due to COVID-19 virus Recommend continue symbicort 2 puffs twice a day.  Recommended ventoling hfa 2 puffs qid x 48 hours and the decreased to 2 puffs qid prn sob.  Pt has hydromet cough syrup already. - Ambulatory Referral for Covid Treatment  2. Otalgia of left ear - amoxicillin (AMOXIL) 875 MG tablet; Take 1 tablet (875 mg total) by mouth 2 (two) times daily.  Dispense: 20 tablet; Refill: 0  3. Sarcoidosis - Ambulatory Referral for Covid Treatment  4. Diabetic polyneuropathy associated with type 2 diabetes mellitus (HCC)    Orders Placed This Encounter  Procedures  . Ambulatory Referral for Covid Treatment     Meds ordered this encounter  Medications  . amoxicillin (AMOXIL) 875 MG tablet    Sig: Take 1 tablet (875 mg total) by mouth 2 (two) times daily.    Dispense:  20 tablet     Refill:  0    COVID-19 Education: The signs and symptoms of COVID-19 were discussed with the patient and how to seek care for testing (follow up with PCP or arrange E-visit). The importance of social distancing was discussed today.   I spent 15 minutes dedicated to the care of this patient on the date of this encounter. Follow Up:  In Person prn  Signed,  , MD  11/05/2020 9:15 AM    [facility] [place]

## 2020-11-06 ENCOUNTER — Telehealth: Payer: Self-pay | Admitting: Infectious Diseases

## 2020-11-06 ENCOUNTER — Other Ambulatory Visit (HOSPITAL_COMMUNITY): Payer: Self-pay

## 2020-11-06 ENCOUNTER — Other Ambulatory Visit: Payer: Self-pay | Admitting: Infectious Diseases

## 2020-11-06 ENCOUNTER — Ambulatory Visit (HOSPITAL_COMMUNITY)
Admission: RE | Admit: 2020-11-06 | Discharge: 2020-11-06 | Disposition: A | Discharge status: Home / Self Care | Payer: BC Managed Care – PPO | Source: Ambulatory Visit | Attending: Pulmonary Disease | Admitting: Pulmonary Disease

## 2020-11-06 DIAGNOSIS — U071 COVID-19: Secondary | ICD-10-CM | POA: Diagnosis not present

## 2020-11-06 MED ORDER — SOTROVIMAB 500 MG/8ML IV SOLN
500.0000 mg | Freq: Once | INTRAVENOUS | Status: AC
  Administered 2020-11-06: 500 mg via INTRAVENOUS

## 2020-11-06 MED ORDER — ONDANSETRON HCL 4 MG/2ML IJ SOLN
4.0000 mg | Freq: Once | INTRAMUSCULAR | Status: AC
  Administered 2020-11-06: 4 mg via INTRAVENOUS
  Filled 2020-11-06: qty 2

## 2020-11-06 MED ORDER — ALBUTEROL SULFATE HFA 108 (90 BASE) MCG/ACT IN AERS: 2.0000 | INHALATION_SPRAY | Freq: Once | RESPIRATORY_TRACT | Status: DC | PRN

## 2020-11-06 MED ORDER — FAMOTIDINE IN NACL 20-0.9 MG/50ML-% IV SOLN: 20.0000 mg | Freq: Once | INTRAVENOUS | Status: DC | PRN

## 2020-11-06 MED ORDER — METHYLPREDNISOLONE SODIUM SUCC 125 MG IJ SOLR: 125.0000 mg | Freq: Once | INTRAMUSCULAR | Status: DC | PRN

## 2020-11-06 MED ORDER — EPINEPHRINE 0.3 MG/0.3ML IJ SOAJ: 0.3000 mg | Freq: Once | INTRAMUSCULAR | Status: DC | PRN

## 2020-11-06 MED ORDER — SODIUM CHLORIDE 0.9 % IV SOLN: INTRAVENOUS | Status: DC | PRN

## 2020-11-06 MED ORDER — DIPHENHYDRAMINE HCL 50 MG/ML IJ SOLN: 50.0000 mg | Freq: Once | INTRAMUSCULAR | Status: DC | PRN

## 2020-11-06 NOTE — Progress Notes (Signed)
Patient reviewed Fact Sheet for Patients, Parents, and Caregivers for Emergency Use Authorization (EUA) of sotrovimab for the Treatment of Coronavirus. Patient also reviewed and is agreeable to the estimated cost of treatment. Patient is agreeable to proceed.   

## 2020-11-06 NOTE — Progress Notes (Signed)
Diagnosis: COVID-19  Physician: Dr. Patrick Penninger  Procedure: Covid Infusion Clinic Med: Sotrovimab infusion - Provided patient with sotrovimab fact sheet for patients, parents, and caregivers prior to infusion.   Complications: No immediate complications noted  Discharge: Discharged home    

## 2020-11-06 NOTE — Telephone Encounter (Signed)
Called to discuss with patient about COVID-19 symptoms and the use of one of the available treatments for those with mild to moderate Covid symptoms and at a high risk of hospitalization.  Pt appears to qualify for outpatient treatment due to co-morbid conditions and/or a member of an at-risk group in accordance with the FDA Emergency Use Authorization.    Symptom onset: 11/02/20 Vaccinated: yes - completed April 2021 Booster? Not documented  Immunocompromised? no Qualifiers: BMI, sarcoidosis, HTN, DM  Unable to reach pt - did not answer LVM and sent Mychart  Would be able to offer sotrovimab to her given no booster hx vs molnupiravir if she prefer oral treatment.    Rexene Alberts, NP

## 2020-11-06 NOTE — Discharge Instructions (Signed)

## 2020-11-06 NOTE — Progress Notes (Signed)
I connected by phone with Holly [name] on 11/06/2020 at 12:38 PM to discuss the potential use of a new treatment for mild to moderate COVID-19 viral infection in non-hospitalized patients.  This patient is a 55 y.o. female that meets the FDA criteria for Emergency Use Authorization of COVID monoclonal antibody sotrovimab.  Has a (+) direct SARS-CoV-2 viral test result  Has mild or moderate COVID-19   Is NOT hospitalized due to COVID-19  Is within 10 days of symptom onset  Has at least one of the high risk factor(s) for progression to severe COVID-19 and/or hospitalization as defined in EUA.  Specific high risk criteria : BMI > 25, Immunosuppressive Disease or Treatment, Cardiovascular disease or hypertension and Other high risk medical condition per CDC:  partially vaccinated   I have spoken and communicated the following to the patient or parent/caregiver regarding COVID monoclonal antibody treatment:  1. FDA has authorized the emergency use for the treatment of mild to moderate COVID-19 in adults and pediatric patients with positive results of direct SARS-CoV-2 viral testing who are 74 years of age and older weighing at least 40 kg, and who are at high risk for progressing to severe COVID-19 and/or hospitalization.  2. The significant known and potential risks and benefits of COVID monoclonal antibody, and the extent to which such potential risks and benefits are unknown.  3. Information on available alternative treatments and the risks and benefits of those alternatives, including clinical trials.  4. Patients treated with COVID monoclonal antibody should continue to self-isolate and use infection control measures (e.g., wear mask, isolate, social distance, avoid sharing personal items, clean and disinfect "high touch" surfaces, and frequent handwashing) according to CDC guidelines.   5. The patient or parent/caregiver has the option to accept or refuse COVID monoclonal antibody  treatment.  After reviewing this information with the patient, the patient has agreed to receive one of the available covid 19 monoclonal antibodies and will be provided an appropriate fact sheet prior to infusion. Holly Alberts, NP 11/06/2020 12:38 PM

## 2020-11-11 DIAGNOSIS — G4733 Obstructive sleep apnea (adult) (pediatric): Secondary | ICD-10-CM | POA: Diagnosis not present

## 2020-11-12 NOTE — Telephone Encounter (Signed)
11/12/2020  10/30/2020-home sleep study- AHI 8.1, SaO2 low 82%  Please let the patient know that her home sleep study did show mild obstructive sleep apnea. She stops breathing around eight times an hour while she sleeps. Could consider treatment options such as CPAP, oral appliance or sleep positioning and weight loss.  Would recommend getting the patient set up for a follow-up visit in our office with either Dr. Delton Coombes or an APP to discuss treatment options. If the patient would like to proceed forward with CPAP therapy can go ahead and place order for CPAP as listed below:  New CPAP start DME of patient's choice APAP setting 5-15 Mask of choice  Patient will need to follow-up in our office in 2 to 3 months after starting CPAP therapy  Elisha Headland, FNP

## 2020-11-12 NOTE — Telephone Encounter (Signed)
Patient is requesting the results of his HST.  Please advise.  Thank you.

## 2020-11-18 ENCOUNTER — Encounter: Payer: Self-pay | Admitting: Family Medicine

## 2020-11-18 ENCOUNTER — Telehealth (INDEPENDENT_AMBULATORY_CARE_PROVIDER_SITE_OTHER): Payer: BC Managed Care – PPO | Admitting: Family Medicine

## 2020-11-18 VITALS — BP 128/72 | HR 88 | Temp 97.3°F | Ht 70.5 in | Wt 305.0 lb

## 2020-11-18 DIAGNOSIS — J189 Pneumonia, unspecified organism: Secondary | ICD-10-CM

## 2020-11-18 DIAGNOSIS — U099 Post covid-19 condition, unspecified: Secondary | ICD-10-CM | POA: Diagnosis not present

## 2020-11-18 DIAGNOSIS — R0789 Other chest pain: Secondary | ICD-10-CM

## 2020-11-18 DIAGNOSIS — R0602 Shortness of breath: Secondary | ICD-10-CM

## 2020-11-18 DIAGNOSIS — J018 Other acute sinusitis: Secondary | ICD-10-CM | POA: Diagnosis not present

## 2020-11-18 MED ORDER — CEFTRIAXONE SODIUM 1 G IJ SOLR
1.0000 g | Freq: Once | INTRAMUSCULAR | Status: AC
  Administered 2020-11-18: 1 g via INTRAMUSCULAR

## 2020-11-18 MED ORDER — AZITHROMYCIN 250 MG PO TABS: ORAL_TABLET | ORAL | 0 refills | Status: DC

## 2020-11-18 NOTE — Patient Instructions (Signed)
Rocephin shot given.  Sent zithromax 250 mg 2 daily x 3 days.  Sending for CTA of chest to rule out a pulmonary embolus (blood clot) Given xarelto 15 mg one twice a day samples to take if CTA of chest shows a blood clot. You will be instructed to take this by provider on call.

## 2020-11-18 NOTE — Progress Notes (Signed)
Virtual visit started on phone and was converted to in house visit.   Date:  11/22/2020   ID:  Holly [name], DOB 15-Sep-1966, MRN [id number]  PCP:  Holly Ohara, MD   Evaluation Performed: acute  Chief Complaint:  Cough.   History of Present Illness:    Holly [name] is a 55 y.o. female with   Diagnosed with covid 19, Day 18. MAB infusion Day 6.  Improved, but than started worsening. Symptoms now include, chills, sweats, achy. Chest hurts and back hurts. Hurts to take a deep breath. Lots of PND. Lots of sore throat.  Poor appetite. Swelling in legs (rt > lt.) Has Shortness of breath. Using symbicort and albuterol.   The patient does have symptoms concerning for COVID-19 infection (fever, chills, cough, or new shortness of breath).   Past Medical History:  Diagnosis Date  . Anxiety   . COVID-19 virus detected 12/30/2019   10/30/2019-SARS-CoV-2-positive Didn't require hospitalization    . Depression   . Diabetes mellitus without complication (HCC)   . Fatty liver   . Goiter   . Hashimoto's thyroiditis   . Hilar adenopathy 03/20/19  04/11/19   CTA CHEST and PET per DR. Anne Ng [name]  . History of kidney stones   . Hypertension   . Mediastinal adenopathy    PER CTA CHEST PET SCAN per DR. Lanae Crumbly [name]  . Pneumonia   . Supraclavicular adenopathy 03/20/2019   PER CTA CHEST and PET per DR. Conchita Paris    Past Surgical History:  Procedure Laterality Date  . ABDOMINAL HYSTERECTOMY     COMPLETE  . ADHESIOLYSIS    . APPENDECTOMY    . CESAREAN SECTION    . MEDIASTINOSCOPY N/A 04/18/2019   Procedure: MEDIASTINOSCOPY;  Surgeon: Holly Ovens, MD;  Location: [facility] OR;  Service: Thoracic;  Laterality: N/A;  . VIDEO BRONCHOSCOPY WITH ENDOBRONCHIAL ULTRASOUND N/A 04/18/2019   Procedure: VIDEO BRONCHOSCOPY WITH ENDOBRONCHIAL ULTRASOUND;  Surgeon: Holly Ovens, MD;  Location: [facility] OR;  Service: Thoracic;  Laterality: N/A;    Family History  Problem Relation Age of Onset  .  Anuerysm Mother        BRAIN  . Cancer Mother        BREAST  . COPD Father   . Hyperlipidemia Father   . Congestive Heart Failure Father   . Bipolar disorder Sister   . Diabetes Sister   . Fibromyalgia Sister   . Cancer Paternal Uncle        LUNG  . Cancer Maternal Grandmother 46       BREAST  . Cancer Paternal Grandfather        LUNG    Social History   Socioeconomic History  . Marital status: Divorced    Spouse name: Not on file  . Number of children: Not on file  . Years of education: Not on file  . Highest education level: Not on file  Occupational History  . Not on file  Tobacco Use  . Smoking status: Never Smoker  . Smokeless tobacco: Never Used  Vaping Use  . Vaping Use: Never used  Substance and Sexual Activity  . Alcohol use: Yes    Comment: VERY RARE OCCASION  . Drug use: Never  . Sexual activity: Not on file  Other Topics Concern  . Not on file  Social History Narrative  . Not on file   Social Determinants of Health   Financial Resource Strain: Not on file  Food Insecurity:  Not on file  Transportation Needs: Not on file  Physical Activity: Not on file  Stress: Not on file  Social Connections: Not on file  Intimate Partner Violence: Not on file    Outpatient Medications Prior to Visit  Medication Sig Dispense Refill  . albuterol (VENTOLIN HFA) 108 (90 Base) MCG/ACT inhaler Inhale 2 puffs into the lungs every 6 (six) hours as needed for wheezing or shortness of breath. 18 g 2  . atenolol-chlorthalidone (TENORETIC) 50-25 MG tablet Take 1 tablet by mouth daily.     Marland Kitchen atorvastatin (LIPITOR) 80 MG tablet TAKE 1 TABLET BY MOUTH EVERY DAY 90 tablet 0  . budesonide-formoterol (SYMBICORT) 160-4.5 MCG/ACT inhaler Inhale 2 puffs into the lungs 2 (two) times daily. 1 each 3  . buPROPion (WELLBUTRIN XL) 300 MG 24 hr tablet TAKE 1 TABLET BY MOUTH EVERY DAY 90 tablet 0  . cyclobenzaprine (FLEXERIL) 5 MG tablet Take 1 tablet (5 mg total) by mouth 3 (three) times  daily as needed for muscle spasms. 40 tablet 0  . famotidine (PEPCID) 40 MG tablet TAKE 1 TABLET BY MOUTH TWICE A DAY 180 tablet 1  . gabapentin (NEURONTIN) 300 MG capsule TAKE 1 CAPSULE BY MOUTH THREE TIMES A DAY 270 capsule 1  . ibuprofen (ADVIL) 600 MG tablet Take 1 tablet (600 mg total) by mouth every 6 (six) hours as needed. 30 tablet 0  . irbesartan (AVAPRO) 75 MG tablet TAKE 1 TABLET BY MOUTH EVERY DAY 90 tablet 0  . levothyroxine (SYNTHROID) 25 MCG tablet Take 1 tablet (25 mcg total) by mouth daily before breakfast. 90 tablet 3  . metFORMIN (GLUCOPHAGE) 1000 MG tablet Take 1 tablet (1,000 mg total) by mouth 2 (two) times daily with a meal. Please call for a fasting follow up appointment. Thank you, Dr. Sedalia [name] 180 tablet 0  . venlafaxine XR (EFFEXOR-XR) 75 MG 24 hr capsule Take 300 mg by mouth daily. Takes 4 (75mg ) capsules daily=300mg     . amoxicillin (AMOXIL) 875 MG tablet Take 1 tablet (875 mg total) by mouth 2 (two) times daily. 20 tablet 0  . traZODone (DESYREL) 100 MG tablet Take 100 mg by mouth at bedtime as needed for sleep.     . traZODone (DESYREL) 150 MG tablet Take 150 mg by mouth at bedtime.     No facility-administered medications prior to visit.    Allergies:   Avelox [moxifloxacin hcl in nacl], Cymbalta [duloxetine hcl], and Oxycodone-acetaminophen   Social History   Tobacco Use  . Smoking status: Never Smoker  . Smokeless tobacco: Never Used  Vaping Use  . Vaping Use: Never used  Substance Use Topics  . Alcohol use: Yes    Comment: VERY RARE OCCASION  . Drug use: Never     Review of Systems  Constitutional: Positive for chills and diaphoresis. Negative for fever and malaise/fatigue.       Poor appetite.  HENT: Positive for sore throat. Negative for ear pain and sinus pain.   Respiratory: Positive for shortness of breath. Negative for cough.   Cardiovascular: Positive for chest pain (radiates in back pain.).  Musculoskeletal: Positive for myalgias.   Neurological: Negative for headaches.     Labs/Other Tests and Data Reviewed:    Recent Labs: 12/31/2019: Pro B Natriuretic peptide (BNP) 39.0 08/02/2020: ALT 26; BUN 11; Creatinine, Ser 0.77; Hemoglobin 14.3; Platelets 292; Potassium 4.6; Sodium 141; TSH 3.030   Recent Lipid Panel Lab Results  Component Value Date/Time   CHOL 217 (H) 08/02/2020 09:39  AM   TRIG 183 (H) 08/02/2020 09:39 AM   HDL 42 08/02/2020 09:39 AM   CHOLHDL 5.2 (H) 08/02/2020 09:39 AM   LDLCALC 142 (H) 08/02/2020 09:39 AM    Wt Readings from Last 3 Encounters:  11/18/20 (!) 305 lb (138.3 kg)  11/05/20 300 lb (136.1 kg)  09/28/20 296 lb 12.8 oz (134.6 kg)     Objective:    Vital Signs:  BP 128/72 (BP Location: Right Arm, Patient Position: Sitting)   Pulse 88   Temp (!) 97.3 F (36.3 C) (Temporal)   Ht 5' 10.5" (1.791 m)   Wt (!) 305 lb (138.3 kg)   SpO2 94%   BMI 43.14 kg/m    Physical Exam Vitals reviewed.  Constitutional:      Appearance: Normal appearance. She is well-developed. She is obese.  HENT:     Right Ear: Tympanic membrane, ear canal and external ear normal.     Left Ear: Tympanic membrane, ear canal and external ear normal.     Nose: Nose normal.     Mouth/Throat:     Pharynx: Oropharynx is clear. No pharyngeal swelling, oropharyngeal exudate or posterior oropharyngeal erythema.  Cardiovascular:     Rate and Rhythm: Normal rate and regular rhythm.     Heart sounds: Normal heart sounds. No murmur heard.   Pulmonary:     Effort: No respiratory distress.     Breath sounds: Normal breath sounds.  Abdominal:     General: Abdomen is flat. Bowel sounds are normal.     Palpations: Abdomen is soft.     Tenderness: There is no abdominal tenderness.  Neurological:     Mental Status: She is alert and oriented to person, place, and time.  Psychiatric:        Mood and Affect: Mood normal.        Behavior: Behavior normal.      ASSESSMENT & PLAN:   1. Other chest pain 2. Shortness  of breath 3. Post-COVID syndrome 4. Acute non-recurrent sinusitis of other sinus 5. Pneumonia due to infectious organism, unspecified laterality, unspecified part of lung - cefTRIAXone (ROCEPHIN) injection 1 g   PLAN:  Rocephin shot given.  Sent zithromax 250 mg 2 daily x 3 days.  Sending for CTA of chest to rule out a pulmonary embolus (blood clot) Given xarelto 15 mg one twice a day samples to take if CTA of chest shows a blood clot. You will be instructed to take this by provider on call.  Orders Placed This Encounter  Procedures  . CT Angio Chest W/Cm &/Or Wo Cm     Meds ordered this encounter  Medications  . cefTRIAXone (ROCEPHIN) injection 1 g  . azithromycin (ZITHROMAX) 250 MG tablet    Sig: 2 daily x 3 days    Dispense:  6 tablet    Refill:  0    Follow Up:  In Person prn  Signed,  Holly Ohara, MD  11/22/2020 12:22 AM    [facility] [place]

## 2020-11-19 ENCOUNTER — Telehealth: Payer: Self-pay | Admitting: Emergency Medicine

## 2020-11-19 NOTE — Telephone Encounter (Signed)
Pharmacy is [facility] in [place]. Patient phone number is [phone].

## 2020-11-19 NOTE — Telephone Encounter (Signed)
ATC patient went straight to voicemail and mailbox is full unable to leave voicemail will try again

## 2020-11-22 NOTE — Telephone Encounter (Signed)
11/22/2020  Was able to successfully reach patient.  Patient status post COVID-19 infection.  Received monoclonal antibody infusion on 11/06/2020.  Patient was seen by primary care on 11/18/2020.  Patient reporting back pain.  Patient was seen by PCP for video visit on 11/18/2020.  A CTA chest was ordered.  This was completed at [facility].  CTA chest was negative for central, segmental or subsegmental PE.  Does show minimal groundglass opacities  Patient reports that overall she feels better after being treated with the azithromycin and the Rocephin shot by primary care.  She is requesting prednisone.  She is able to talk in full and complete sentences. No acute distress.   Patient scheduled for in person physical visit on 11/23/2020 at patient's request at 4 PM with TP NP.  We will forward to TP NP as FYI.  Elisha Headland FNP

## 2020-11-22 NOTE — Telephone Encounter (Signed)
ATC patient unable to reach, VM is full. Call one more time then close out message per protocol

## 2020-11-23 ENCOUNTER — Other Ambulatory Visit: Payer: Self-pay

## 2020-11-23 ENCOUNTER — Encounter: Payer: Self-pay | Admitting: Family Medicine

## 2020-11-23 ENCOUNTER — Ambulatory Visit (INDEPENDENT_AMBULATORY_CARE_PROVIDER_SITE_OTHER): Payer: BC Managed Care – PPO | Admitting: Adult Health

## 2020-11-23 ENCOUNTER — Encounter: Payer: Self-pay | Admitting: Adult Health

## 2020-11-23 VITALS — BP 100/66 | HR 90 | Temp 97.8°F | Ht 70.5 in | Wt 301.6 lb

## 2020-11-23 DIAGNOSIS — J45901 Unspecified asthma with (acute) exacerbation: Secondary | ICD-10-CM | POA: Insufficient documentation

## 2020-11-23 DIAGNOSIS — G4733 Obstructive sleep apnea (adult) (pediatric): Secondary | ICD-10-CM | POA: Diagnosis not present

## 2020-11-23 DIAGNOSIS — J4531 Mild persistent asthma with (acute) exacerbation: Secondary | ICD-10-CM

## 2020-11-23 DIAGNOSIS — D869 Sarcoidosis, unspecified: Secondary | ICD-10-CM | POA: Diagnosis not present

## 2020-11-23 HISTORY — DX: Obstructive sleep apnea (adult) (pediatric): G47.33

## 2020-11-23 MED ORDER — BUDESONIDE-FORMOTEROL FUMARATE 160-4.5 MCG/ACT IN AERO: 2.0000 | INHALATION_SPRAY | Freq: Two times a day (BID) | RESPIRATORY_TRACT | 3 refills | Status: DC

## 2020-11-23 MED ORDER — ALBUTEROL SULFATE HFA 108 (90 BASE) MCG/ACT IN AERS: 2.0000 | INHALATION_SPRAY | Freq: Four times a day (QID) | RESPIRATORY_TRACT | 2 refills | Status: DC | PRN

## 2020-11-23 MED ORDER — PREDNISONE 10 MG PO TABS: ORAL_TABLET | ORAL | 0 refills | Status: DC

## 2020-11-23 MED ORDER — BENZONATATE 200 MG PO CAPS: 200.0000 mg | ORAL_CAPSULE | Freq: Three times a day (TID) | ORAL | 1 refills | Status: DC | PRN

## 2020-11-23 NOTE — Assessment & Plan Note (Signed)
Mild obstructive sleep apnea Home sleep study.  Patient education given on sleep apnea.  Treatment options were discussed.  Patient will proceed with CPAP.  Begin on AutoSet 5-15 with mask of choice.  Plan  Patient Instructions  Prednisone taper over next week  Mucinex DM Twice daily  As needed  Congestion /cough .  Saline nasal rinse As needed   Zyrtec 10mg  At bedtime  As needed   Continue on Symbicort 2 puffs Twice daily  , rinse after use  Albuterol Inhaler As needed wheezing .  Begin CPAP At bedtime   Work on healthy weight loss.  Do not drive if sleepy  May like dream wear nasal mask .  Follow up with Dr. June  In 2-3 months and As needed   Please contact office for sooner follow up if symptoms do not improve or worsen or seek emergency care

## 2020-11-23 NOTE — Progress Notes (Signed)
@Patient  ID: , female    DOB: 04-Jun-1966, 55 y.o.   MRN: [id number]  Chief Complaint  Patient presents with  . Follow-up  . Sarcoidosis    Referring provider: [id number], MD  HPI: 55 year old female never smoker followed for sarcoidosis, micronodular disease and obstructive lung disease. COVID-19 infection January 2021 and January 2022 TEST/EVENTS :  05/05/2019-ACE level-48  04/18/2019-chest x-ray-no demonstrated complication from procedure, grossly stable mediastinal adenopathy  05/26/2019-pulmonary function test-FVC 3.51 (80% predicted), postbronchodilator ratio 74, postbronchodilator FEV1 2.59 (75% predicted), no bronchodilator response, DLCO 24.86 (96% predicted)  05/13/2019-high-resolution CT chest-multiple subtle central peribronchovascular nodularity nodularity along the fissures, a few scattered small pulmonary nodules measuring up to 4 mm in left lower lobe, pleural calcification and posterior right hemothorax, findings in keeping with diagnosis of sarcoid, enlarged pulmonary trunk indicative of PAH  09/01/2020-chest x-ray-stable exam, no acute process, stable hilar adenopathy consistent with known sarcoid  11/23/2020 Follow up : Sarcoidosis  Patient presents for 34-month follow-up.  Patient has underlying sarcoidosis. Says she was doing well up until 3 weeks ago when she developed COVID-19. Patient complains of increased cough and shortness of breath.  She recently had Covid 3 weeks ago.  Patient has been vaccinated x2.  She received the monoclonal antibody infusion.She remains on Symbicort twice daily.  Complains that she continues to have ongoing cough and shortness of breath.  Patient says she was seen by her primary care provider and ordered a CT chest which was negative for PE or pneumonia.  Care everywhere reports showed mild groundglass opacities in the bases. Appetite is good with no nausea vomiting diarrhea.  Patient recently had a home sleep study that  showed mild sleep apnea with AHI at 8.1.  SPO2 low at 82%.  We discussed sleep apnea and potential complications of sleep apnea.  Also went over treatment options including weight loss, oral appliance and CPAP.  Patient would like to begin with CPAP.  Patient has significant central symptoms with restless sleep, snoring, daytime sleepiness.     Allergies  Allergen Reactions  . Avelox [Moxifloxacin Hcl In Nacl]   . Cymbalta [Duloxetine Hcl]   . Oxycodone-Acetaminophen     Immunization History  Administered Date(s) Administered  . Influenza Inj Mdck Quad Pf 08/02/2020  . Influenza,inj,Quad PF,6+ Mos 07/18/2018  . Influenza-Unspecified 07/11/2016, 07/24/2017, 09/03/2018  . Moderna Sars-Covid-2 Vaccination 12/22/2019, 01/19/2020  . Pneumococcal Polysaccharide-23 08/02/2020    Past Medical History:  Diagnosis Date  . Anxiety   . COVID-19 virus detected 12/30/2019   10/30/2019-SARS-CoV-2-positive Didn't require hospitalization    . Depression   . Diabetes mellitus without complication (HCC)   . Fatty liver   . Goiter   . Hashimoto's thyroiditis   . Hilar adenopathy 03/20/19  04/11/19   CTA CHEST and PET per DR. 04/13/19 LEWIS  . History of kidney stones   . Hypertension   . Mediastinal adenopathy    PER CTA CHEST PET SCAN per DR. Anne Ng LEWIS  . Pneumonia   . Supraclavicular adenopathy 03/20/2019   PER CTA CHEST and PET per DR. 03/22/2019 LEWIS    Tobacco History: Social History   Tobacco Use  Smoking Status Never Smoker  Smokeless Tobacco Never Used   Counseling given: Not Answered   Outpatient Medications Prior to Visit  Medication Sig Dispense Refill  . albuterol (VENTOLIN HFA) 108 (90 Base) MCG/ACT inhaler Inhale 2 puffs into the lungs every 6 (six) hours as needed for wheezing or shortness of breath. 18 g  2  . atenolol-chlorthalidone (TENORETIC) 50-25 MG tablet Take 1 tablet by mouth daily.     Marland Kitchen atorvastatin (LIPITOR) 80 MG tablet TAKE 1 TABLET BY MOUTH EVERY DAY 90  tablet 0  . budesonide-formoterol (SYMBICORT) 160-4.5 MCG/ACT inhaler Inhale 2 puffs into the lungs 2 (two) times daily. 1 each 3  . buPROPion (WELLBUTRIN XL) 300 MG 24 hr tablet TAKE 1 TABLET BY MOUTH EVERY DAY 90 tablet 0  . cyclobenzaprine (FLEXERIL) 5 MG tablet Take 1 tablet (5 mg total) by mouth 3 (three) times daily as needed for muscle spasms. 40 tablet 0  . famotidine (PEPCID) 40 MG tablet TAKE 1 TABLET BY MOUTH TWICE A DAY 180 tablet 1  . gabapentin (NEURONTIN) 300 MG capsule TAKE 1 CAPSULE BY MOUTH THREE TIMES A DAY 270 capsule 1  . ibuprofen (ADVIL) 600 MG tablet Take 1 tablet (600 mg total) by mouth every 6 (six) hours as needed. 30 tablet 0  . irbesartan (AVAPRO) 75 MG tablet TAKE 1 TABLET BY MOUTH EVERY DAY 90 tablet 0  . levothyroxine (SYNTHROID) 25 MCG tablet Take 1 tablet (25 mcg total) by mouth daily before breakfast. 90 tablet 3  . metFORMIN (GLUCOPHAGE) 1000 MG tablet Take 1 tablet (1,000 mg total) by mouth 2 (two) times daily with a meal. Please call for a fasting follow up appointment. Thank you, Dr. Sedalia Muta 180 tablet 0  . traZODone (DESYREL) 150 MG tablet Take 150 mg by mouth at bedtime.    Marland Kitchen venlafaxine XR (EFFEXOR-XR) 75 MG 24 hr capsule Take 300 mg by mouth daily. Takes 4 (75mg ) capsules daily=300mg     . azithromycin (ZITHROMAX) 250 MG tablet 2 daily x 3 days (Patient not taking: Reported on 11/23/2020) 6 tablet 0   No facility-administered medications prior to visit.     Review of Systems:   Constitutional:   No  weight loss, night sweats,  Fevers, chills,  +fatigue, or  lassitude.  HEENT:   No headaches,  Difficulty swallowing,  Tooth/dental problems, or  Sore throat,                No sneezing, itching, ear ache, + nasal congestion, post nasal drip,   CV:  No chest pain,  Orthopnea, PND, swelling in lower extremities, anasarca, dizziness, palpitations, syncope.   GI  No heartburn, indigestion, abdominal pain, nausea, vomiting, diarrhea, change in bowel habits,  loss of appetite, bloody stools.   Resp:    No chest wall deformity  Skin: no rash or lesions.  GU: no dysuria, change in color of urine, no urgency or frequency.  No flank pain, no hematuria   MS:  No joint pain or swelling.  No decreased range of motion.  No back pain.    Physical Exam  BP 100/66 (BP Location: Left Arm, Patient Position: Sitting, Cuff Size: Normal)   Pulse 90   Temp 97.8 F (36.6 C) (Temporal)   Ht 5' 10.5" (1.791 m)   Wt (!) 301 lb 9.6 oz (136.8 kg)   SpO2 96%   BMI 42.66 kg/m   GEN: A/Ox3; pleasant , NAD, well nourished , BMI 42   HEENT:  [place]/AT,   , NOSE-clear, THROAT-clear, no lesions, no postnasal drip or exudate noted.   NECK:  Supple w/ fair ROM; no JVD; normal carotid impulses w/o bruits; no thyromegaly or nodules palpated; no lymphadenopathy.    RESP  Clear  P & A; w/o, wheezes/ rales/ or rhonchi. no accessory muscle use, no dullness to percussion  CARD:  RRR, no m/r/g, tr  peripheral edema, pulses intact, no cyanosis or clubbing.  GI:   Soft & nt; nml bowel sounds; no organomegaly or masses detected.   Musco: Warm bil, no deformities or joint swelling noted.   Neuro: alert, no focal deficits noted.    Skin: Warm, no lesions or rashes    Lab Results:  CBC  BNP No results found for: BNP  ProBNP  Imaging: No results found.  cefTRIAXone (ROCEPHIN) injection 1 g    Date Action Dose Route User   11/18/2020 1423 Given 1 g Intramuscular (Right Ventrogluteal) Wandalee Ferdinand, RN      PFT Results Latest Ref Rng & Units 05/26/2019  FVC-Pre L 3.51  FVC-Predicted Pre % 80  FVC-Post L 3.49  FVC-Predicted Post % 80  Pre FEV1/FVC % % 74  Post FEV1/FCV % % 74  FEV1-Pre L 2.60  FEV1-Predicted Pre % 76  FEV1-Post L 2.59  DLCO uncorrected ml/min/mmHg 24.86  DLCO UNC% % 96  DLVA Predicted % 123    No results found for: NITRICOXIDE      Assessment & Plan:   Sarcoidosis Flare after COVID-19 will treat with short course of  prednisone.  Plan  Patient Instructions  Prednisone taper over next week  Mucinex DM Twice daily  As needed  Congestion /cough .  Saline nasal rinse As needed   Zyrtec 10mg  At bedtime  As needed   Continue on Symbicort 2 puffs Twice daily  , rinse after use  Albuterol Inhaler As needed wheezing .  Begin CPAP At bedtime   Work on healthy weight loss.  Do not drive if sleepy  May like dream wear nasal mask .  Follow up with Dr. June  In 2-3 months and As needed   Please contact office for sooner follow up if symptoms do not improve or worsen or seek emergency care        OSA (obstructive sleep apnea) Mild obstructive sleep apnea Home sleep study.  Patient education given on sleep apnea.  Treatment options were discussed.  Patient will proceed with CPAP.  Begin on AutoSet 5-15 with mask of choice.  Plan  Patient Instructions  Prednisone taper over next week  Mucinex DM Twice daily  As needed  Congestion /cough .  Saline nasal rinse As needed   Zyrtec 10mg  At bedtime  As needed   Continue on Symbicort 2 puffs Twice daily  , rinse after use  Albuterol Inhaler As needed wheezing .  Begin CPAP At bedtime   Work on healthy weight loss.  Do not drive if sleepy  May like dream wear nasal mask .  Follow up with Dr.  In 2-3 months and As needed   Please contact office for sooner follow up if symptoms do not improve or worsen or seek emergency care        Asthmatic bronchitis with exacerbation Acute exacerbation post viral illness.  We will treat with a short course of prednisone.  Continue on Symbicort.  Treat for cough control.  Plan  Patient Instructions  Prednisone taper over next week  Mucinex DM Twice daily  As needed  Congestion /cough .  Saline nasal rinse As needed   Zyrtec 10mg  At bedtime  As needed   Continue on Symbicort 2 puffs Twice daily  , rinse after use  Albuterol Inhaler As needed wheezing .  Begin CPAP At bedtime   Work on healthy weight loss.  Do  not drive  if sleepy  May like dream wear nasal mask .  Follow up with Dr. Delton Coombes  In 2-3 months and As needed   Please contact office for sooner follow up if symptoms do not improve or worsen or seek emergency care           Rubye Oaks, NP 11/23/2020

## 2020-11-23 NOTE — Addendum Note (Signed)
Addended by: Delrae Rend on: 11/23/2020 05:31 PM   Modules accepted: Orders

## 2020-11-23 NOTE — Assessment & Plan Note (Signed)
Acute exacerbation post viral illness.  We will treat with a short course of prednisone.  Continue on Symbicort.  Treat for cough control.  Plan  Patient Instructions  Prednisone taper over next week  Mucinex DM Twice daily  As needed  Congestion /cough .  Saline nasal rinse As needed   Zyrtec 10mg  At bedtime  As needed   Continue on Symbicort 2 puffs Twice daily  , rinse after use  Albuterol Inhaler As needed wheezing .  Begin CPAP At bedtime   Work on healthy weight loss.  Do not drive if sleepy  May like dream wear nasal mask .  Follow up with Dr. June  In 2-3 months and As needed   Please contact office for sooner follow up if symptoms do not improve or worsen or seek emergency care

## 2020-11-23 NOTE — Patient Instructions (Addendum)
Prednisone taper over next week  Mucinex DM Twice daily  As needed  Congestion /cough .  Saline nasal rinse As needed   Zyrtec 10mg  At bedtime  As needed   Continue on Symbicort 2 puffs Twice daily  , rinse after use  Albuterol Inhaler As needed wheezing .  Begin CPAP At bedtime   Work on healthy weight loss.  Do not drive if sleepy  May like dream wear nasal mask .  Follow up with Dr. June  In 2-3 months and As needed   Please contact office for sooner follow up if symptoms do not improve or worsen or seek emergency care

## 2020-11-23 NOTE — Assessment & Plan Note (Signed)
Flare after COVID-19 will treat with short course of prednisone.  Plan  Patient Instructions  Prednisone taper over next week  Mucinex DM Twice daily  As needed  Congestion /cough .  Saline nasal rinse As needed   Zyrtec 10mg  At bedtime  As needed   Continue on Symbicort 2 puffs Twice daily  , rinse after use  Albuterol Inhaler As needed wheezing .  Begin CPAP At bedtime   Work on healthy weight loss.  Do not drive if sleepy  May like dream wear nasal mask .  Follow up with Dr. June  In 2-3 months and As needed   Please contact office for sooner follow up if symptoms do not improve or worsen or seek emergency care

## 2020-11-26 ENCOUNTER — Encounter: Payer: Self-pay | Admitting: Family Medicine

## 2020-11-29 NOTE — Telephone Encounter (Signed)
Please call pt and find out if she ended up going to the urgent care. She messaged the nurses after we closed Friday and It was sent to me this morning.  Pt needs an appointment if not already seen  She still had zpack in her system when she called. Kc

## 2020-12-03 ENCOUNTER — Encounter: Payer: Self-pay | Admitting: Nurse Practitioner

## 2020-12-03 ENCOUNTER — Telehealth (INDEPENDENT_AMBULATORY_CARE_PROVIDER_SITE_OTHER): Payer: BC Managed Care – PPO | Admitting: Nurse Practitioner

## 2020-12-03 VITALS — Ht 70.5 in | Wt 301.0 lb

## 2020-12-03 DIAGNOSIS — U099 Post covid-19 condition, unspecified: Secondary | ICD-10-CM | POA: Diagnosis not present

## 2020-12-03 DIAGNOSIS — J0101 Acute recurrent maxillary sinusitis: Secondary | ICD-10-CM

## 2020-12-03 MED ORDER — AMOXICILLIN-POT CLAVULANATE 875-125 MG PO TABS: 1.0000 | ORAL_TABLET | Freq: Two times a day (BID) | ORAL | 0 refills | Status: DC

## 2020-12-03 MED ORDER — FLUTICASONE PROPIONATE 50 MCG/ACT NA SUSP
2.0000 | Freq: Every day | NASAL | 6 refills | Status: DC
Start: 2020-12-03 — End: 2024-01-30

## 2020-12-03 MED ORDER — LORATADINE 10 MG PO TABS: 10.0000 mg | ORAL_TABLET | Freq: Every day | ORAL | 0 refills | Status: DC

## 2020-12-03 NOTE — Progress Notes (Signed)
Virtual Visit via Telephone Note   This visit type was conducted due to national recommendations for restrictions regarding the COVID-19 Pandemic (e.g. social distancing) in an effort to limit this patient's exposure and mitigate transmission in our community.  Due to her co-morbid illnesses, this patient is at least at moderate risk for complications without adequate follow up.  This format is felt to be most appropriate for this patient at this time.  The patient did not have access to video technology/had technical difficulties with video requiring transitioning to audio format only (telephone).  All issues noted in this document were discussed and addressed.  No physical exam could be performed with this format.  Patient verbally consented to a telehealth visit.   Date:  12/03/2020   ID:  Holly [name], DOB 08/05/1966, MRN [id number]  Patient Location: Home Provider Location: Office/Clinic  PCP:  Blane Ohara, MD   Evaluation Performed:  Established patient, acute telemedicine visit  Chief Complaint:  Sinus congestion  History of Present Illness:    Holly [name] is a 55 y.o. female with post COVID-19 sinus congestion, teeth pain, rhinorrhea, and non-productive cough. Treatments include Mucinex, Advil, Albuterol, and Symbicort inhalers.   The patient does have symptoms concerning for COVID-19 infection (fever, chills, cough, or new shortness of breath).    Past Medical History:  Diagnosis Date  . Anxiety   . COVID-19 virus detected 12/30/2019   10/30/2019-SARS-CoV-2-positive Didn't require hospitalization    . Depression   . Diabetes mellitus without complication (HCC)   . Fatty liver   . Goiter   . Hashimoto's thyroiditis   . Hilar adenopathy 03/20/19  04/11/19   CTA CHEST and PET per DR. Anne Ng LEWIS  . History of kidney stones   . Hypertension   . Mediastinal adenopathy    PER CTA CHEST PET SCAN per DR. Lanae Crumbly LEWIS  . Pneumonia   . Supraclavicular adenopathy 03/20/2019    PER CTA CHEST and PET per DR. Conchita Paris    Past Surgical History:  Procedure Laterality Date  . ABDOMINAL HYSTERECTOMY     COMPLETE  . ADHESIOLYSIS    . APPENDECTOMY    . CESAREAN SECTION    . MEDIASTINOSCOPY N/A 04/18/2019   Procedure: MEDIASTINOSCOPY;  Surgeon: Delight Ovens, MD;  Location: [facility] OR;  Service: Thoracic;  Laterality: N/A;  . VIDEO BRONCHOSCOPY WITH ENDOBRONCHIAL ULTRASOUND N/A 04/18/2019   Procedure: VIDEO BRONCHOSCOPY WITH ENDOBRONCHIAL ULTRASOUND;  Surgeon: Delight Ovens, MD;  Location: [facility] OR;  Service: Thoracic;  Laterality: N/A;    Family History  Problem Relation Age of Onset  . Anuerysm Mother        BRAIN  . Cancer Mother        BREAST  . COPD Father   . Hyperlipidemia Father   . Congestive Heart Failure Father   . Bipolar disorder Sister   . Diabetes Sister   . Fibromyalgia Sister   . Cancer Paternal Uncle        LUNG  . Cancer Maternal Grandmother 46       BREAST  . Cancer Paternal Grandfather        LUNG    Social History   Socioeconomic History  . Marital status: Divorced    Spouse name: Not on file  . Number of children: Not on file  . Years of education: Not on file  . Highest education level: Not on file  Occupational History  . Not on file  Tobacco Use  .  Smoking status: Never Smoker  . Smokeless tobacco: Never Used  Vaping Use  . Vaping Use: Never used  Substance and Sexual Activity  . Alcohol use: Yes    Comment: VERY RARE OCCASION  . Drug use: Never  . Sexual activity: Not on file  Other Topics Concern  . Not on file  Social History Narrative  . Not on file   Social Determinants of Health   Financial Resource Strain: Not on file  Food Insecurity: Not on file  Transportation Needs: Not on file  Physical Activity: Not on file  Stress: Not on file  Social Connections: Not on file  Intimate Partner Violence: Not on file    Outpatient Medications Prior to Visit  Medication Sig Dispense Refill  .  albuterol (VENTOLIN HFA) 108 (90 Base) MCG/ACT inhaler Inhale 2 puffs into the lungs every 6 (six) hours as needed for wheezing or shortness of breath. 18 g 2  . atenolol-chlorthalidone (TENORETIC) 50-25 MG tablet Take 1 tablet by mouth daily.     Marland Kitchen atorvastatin (LIPITOR) 80 MG tablet TAKE 1 TABLET BY MOUTH EVERY DAY 90 tablet 0  . benzonatate (TESSALON) 200 MG capsule Take 1 capsule (200 mg total) by mouth 3 (three) times daily as needed for cough. 30 capsule 1  . budesonide-formoterol (SYMBICORT) 160-4.5 MCG/ACT inhaler Inhale 2 puffs into the lungs 2 (two) times daily. 1 each 3  . buPROPion (WELLBUTRIN XL) 300 MG 24 hr tablet TAKE 1 TABLET BY MOUTH EVERY DAY 90 tablet 0  . cyclobenzaprine (FLEXERIL) 5 MG tablet Take 1 tablet (5 mg total) by mouth 3 (three) times daily as needed for muscle spasms. 40 tablet 0  . famotidine (PEPCID) 40 MG tablet TAKE 1 TABLET BY MOUTH TWICE A DAY 180 tablet 1  . gabapentin (NEURONTIN) 300 MG capsule TAKE 1 CAPSULE BY MOUTH THREE TIMES A DAY 270 capsule 1  . ibuprofen (ADVIL) 600 MG tablet Take 1 tablet (600 mg total) by mouth every 6 (six) hours as needed. 30 tablet 0  . irbesartan (AVAPRO) 75 MG tablet TAKE 1 TABLET BY MOUTH EVERY DAY 90 tablet 0  . levothyroxine (SYNTHROID) 25 MCG tablet Take 1 tablet (25 mcg total) by mouth daily before breakfast. 90 tablet 3  . metFORMIN (GLUCOPHAGE) 1000 MG tablet Take 1 tablet (1,000 mg total) by mouth 2 (two) times daily with a meal. Please call for a fasting follow up appointment. Thank you, Dr. Sedalia Muta 180 tablet 0  . predniSONE (DELTASONE) 10 MG tablet 4 tabs for 2 days, then 3 tabs for 2 days, 2 tabs for 2 days, then 1 tab for 2 days, then stop 20 tablet 0  . traZODone (DESYREL) 150 MG tablet Take 150 mg by mouth at bedtime.    Marland Kitchen venlafaxine XR (EFFEXOR-XR) 75 MG 24 hr capsule Take 300 mg by mouth daily. Takes 4 (75mg ) capsules daily=300mg      No facility-administered medications prior to visit.    Allergies:   Avelox  [moxifloxacin hcl in nacl], Cymbalta [duloxetine hcl], and Oxycodone-acetaminophen   Social History   Tobacco Use  . Smoking status: Never Smoker  . Smokeless tobacco: Never Used  Vaping Use  . Vaping Use: Never used  Substance Use Topics  . Alcohol use: Yes    Comment: VERY RARE OCCASION  . Drug use: Never     Review of Systems  Constitutional: Positive for malaise/fatigue. Negative for fever.  HENT: Positive for congestion and sinus pain. Negative for ear pain.  Rhinorrhea, post-nasal-drip, and teeth pain  Respiratory: Positive for cough. Negative for shortness of breath.   Cardiovascular: Negative for chest pain and palpitations.  Gastrointestinal: Negative for abdominal pain, diarrhea, nausea and vomiting.  Musculoskeletal: Negative for back pain and myalgias.  Neurological: Positive for headaches.     Labs/Other Tests and Data Reviewed:    Recent Labs: 12/31/2019: Pro B Natriuretic peptide (BNP) 39.0 08/02/2020: ALT 26; BUN 11; Creatinine, Ser 0.77; Hemoglobin 14.3; Platelets 292; Potassium 4.6; Sodium 141; TSH 3.030   Recent Lipid Panel Lab Results  Component Value Date/Time   CHOL 217 (H) 08/02/2020 09:39 AM   TRIG 183 (H) 08/02/2020 09:39 AM   HDL 42 08/02/2020 09:39 AM   CHOLHDL 5.2 (H) 08/02/2020 09:39 AM   LDLCALC 142 (H) 08/02/2020 09:39 AM    Wt Readings from Last 3 Encounters:  11/23/20 (!) 301 lb 9.6 oz (136.8 kg)  11/18/20 (!) 305 lb (138.3 kg)  11/05/20 300 lb (136.1 kg)     Objective:    Vital Signs: Ht 5' 10.5" (1.791 m)   Wt (!) 301 lb (136.5 kg)   BMI 42.58 kg/m    Physical Exam No physical exam performed due to telemedicine visit  ASSESSMENT & PLAN:    1. Acute recurrent maxillary sinusitis - amoxicillin-clavulanate (AUGMENTIN) 875-125 MG tablet; Take 1 tablet by mouth 2 (two) times daily.  Dispense: 20 tablet; Refill: 0 - fluticasone (FLONASE) 50 MCG/ACT nasal spray; Place 2 sprays into both nostrils daily.  Dispense: 16 g;  Refill: 6 - loratadine (CLARITIN) 10 MG tablet; Take 1 tablet (10 mg total) by mouth daily.  Dispense: 90 tablet; Refill: 0  2. Post-COVID syndrome - amoxicillin-clavulanate (AUGMENTIN) 875-125 MG tablet; Take 1 tablet by mouth 2 (two) times daily.  Dispense: 20 tablet; Refill: 0 - fluticasone (FLONASE) 50 MCG/ACT nasal spray; Place 2 sprays into both nostrils daily.  Dispense: 16 g; Refill: 6 - loratadine (CLARITIN) 10 MG tablet; Take 1 tablet (10 mg total) by mouth daily.  Dispense: 90 tablet; Refill: 0      COVID-19 Education: The signs and symptoms of COVID-19 were discussed with the patient and how to seek care for testing (follow up with PCP or arrange E-visit). The importance of social distancing was discussed today.   I spent 15 minutes dedicated to the care of this patient on the date of this encounter to include telephone time with the patient, as well as:   Follow Up:  Virtual Visit  prn  Lydia Guiles, DNP  12/06/20 at 4:44 p.m.     [facility]

## 2020-12-07 ENCOUNTER — Other Ambulatory Visit: Payer: Self-pay | Admitting: Family Medicine

## 2021-01-06 ENCOUNTER — Other Ambulatory Visit: Payer: Self-pay | Admitting: Family Medicine

## 2021-01-15 ENCOUNTER — Other Ambulatory Visit: Payer: Self-pay | Admitting: Family Medicine

## 2021-01-15 DIAGNOSIS — I10 Essential (primary) hypertension: Secondary | ICD-10-CM

## 2021-01-20 ENCOUNTER — Telehealth: Payer: Self-pay

## 2021-01-20 NOTE — Telephone Encounter (Signed)
Pt left VM requesting metformin refilled.   In documentation medication has been refused due to needing an appointment.   Attempted to call patient but no answer and could not leave message. Will send mychart message.   Terrill Mohr 01/20/21 3:16 PM

## 2021-02-03 ENCOUNTER — Other Ambulatory Visit: Payer: Self-pay

## 2021-02-03 ENCOUNTER — Ambulatory Visit (INDEPENDENT_AMBULATORY_CARE_PROVIDER_SITE_OTHER): Payer: BC Managed Care – PPO | Admitting: Legal Medicine

## 2021-02-03 ENCOUNTER — Encounter: Payer: Self-pay | Admitting: Legal Medicine

## 2021-02-03 VITALS — BP 128/68 | HR 82 | Temp 97.5°F | Ht 70.5 in | Wt 287.0 lb

## 2021-02-03 DIAGNOSIS — J4531 Mild persistent asthma with (acute) exacerbation: Secondary | ICD-10-CM

## 2021-02-03 DIAGNOSIS — E1121 Type 2 diabetes mellitus with diabetic nephropathy: Secondary | ICD-10-CM

## 2021-02-03 DIAGNOSIS — F331 Major depressive disorder, recurrent, moderate: Secondary | ICD-10-CM

## 2021-02-03 DIAGNOSIS — E782 Mixed hyperlipidemia: Secondary | ICD-10-CM

## 2021-02-03 DIAGNOSIS — I10 Essential (primary) hypertension: Secondary | ICD-10-CM

## 2021-02-03 DIAGNOSIS — E1142 Type 2 diabetes mellitus with diabetic polyneuropathy: Secondary | ICD-10-CM

## 2021-02-03 DIAGNOSIS — R413 Other amnesia: Secondary | ICD-10-CM

## 2021-02-03 DIAGNOSIS — E039 Hypothyroidism, unspecified: Secondary | ICD-10-CM

## 2021-02-03 MED ORDER — GABAPENTIN 300 MG PO CAPS: 600.0000 mg | ORAL_CAPSULE | Freq: Three times a day (TID) | ORAL | 1 refills | Status: DC

## 2021-02-03 MED ORDER — METFORMIN HCL 1000 MG PO TABS: 1000.0000 mg | ORAL_TABLET | Freq: Two times a day (BID) | ORAL | 2 refills | Status: DC

## 2021-02-03 NOTE — Progress Notes (Signed)
Subjective:  Patient ID: Holly [name], female    DOB: 1965-12-02  Age: 55 y.o. MRN: [id number]  Chief Complaint  Patient presents with  . Hyperlipidemia  . Diabetes    HPI: chronic visit Patient present with type 2 diabetes.  Specifically, this is type 2, noninsulin requiring diabetes, complicated by nephropathy and poyneuropathy.  Compliance with treatment has been good; patient take medicines as directed, maintains diet and exercise regimen, follows up as directed, and is keeping glucose diary.  Date of  diagnosis 2010.  Depression screen has been performed.Tobacco screen nonsmoker. Current medicines for diabetes metformin.  Patient is on irbesartan for renal protection and atorvastatin for cholesterol control.  Patient performs foot exams daily and last ophthalmologic exam was yes  Patient presents for follow up of hypertension.  Patient tolerating irbesartan well with side effects.  Patient was diagnosed with hypertension 2010 so has been treated for hypertension for 10 years.Patient is working on maintaining diet and exercise regimen and follows up as directed. Complication include none  Patient has sarcoiditis , no prednisone, on symbicort, albuterol   Patient had Covid in January and was given immunotherapy and did well, since then her brain is foggy.  Poor short term memory.Marland Kitchen. at times loses orientation while driving and has to see a familiar landmark to reorient herself    Current Outpatient Medications on File Prior to Visit  Medication Sig Dispense Refill  . albuterol (VENTOLIN HFA) 108 (90 Base) MCG/ACT inhaler Inhale 2 puffs into the lungs every 6 (six) hours as needed for wheezing or shortness of breath. 18 g 2  . atorvastatin (LIPITOR) 80 MG tablet TAKE 1 TABLET BY MOUTH EVERY DAY 90 tablet 0  . budesonide-formoterol (SYMBICORT) 160-4.5 MCG/ACT inhaler Inhale 2 puffs into the lungs 2 (two) times daily. 1 each 3  . buPROPion (WELLBUTRIN XL) 300 MG 24 hr tablet TAKE 1 TABLET BY  MOUTH EVERY DAY 90 tablet 0  . famotidine (PEPCID) 40 MG tablet TAKE 1 TABLET BY MOUTH TWICE A DAY 180 tablet 1  . fluticasone (FLONASE) 50 MCG/ACT nasal spray Place 2 sprays into both nostrils daily. 16 g 6  . irbesartan (AVAPRO) 75 MG tablet TAKE 1 TABLET BY MOUTH EVERY DAY 90 tablet 0  . levothyroxine (SYNTHROID) 25 MCG tablet Take 1 tablet (25 mcg total) by mouth daily before breakfast. 90 tablet 3  . loratadine (CLARITIN) 10 MG tablet Take 1 tablet (10 mg total) by mouth daily. 90 tablet 0  . traZODone (DESYREL) 150 MG tablet Take 150 mg by mouth at bedtime.    Marland Kitchen. venlafaxine XR (EFFEXOR-XR) 75 MG 24 hr capsule Take 300 mg by mouth daily. Takes 4 (75mg ) capsules daily=300mg      No current facility-administered medications on file prior to visit.   Past Medical History:  Diagnosis Date  . Anxiety   . COVID-19 virus detected 12/30/2019   10/30/2019-SARS-CoV-2-positive Didn't require hospitalization    . Depression   . Diabetes mellitus without complication (HCC)   . Fatty liver   . Goiter   . Hashimoto's thyroiditis   . Hilar adenopathy 03/20/19  04/11/19   CTA CHEST and PET per DR. Anne NgEQUINCY LEWIS  . History of kidney stones   . Hypertension   . Mediastinal adenopathy    PER CTA CHEST PET SCAN per DR. Lanae CrumblyeQINCY LEWIS  . Pneumonia   . Supraclavicular adenopathy 03/20/2019   PER CTA CHEST and PET per DR. Conchita ParisEQUNCY LEWIS   Past Surgical History:  Procedure Laterality  Date  . ABDOMINAL HYSTERECTOMY     COMPLETE  . ADHESIOLYSIS    . APPENDECTOMY    . CESAREAN SECTION    . MEDIASTINOSCOPY N/A 04/18/2019   Procedure: MEDIASTINOSCOPY;  Surgeon: Delight Ovens, MD;  Location: [facility] OR;  Service: Thoracic;  Laterality: N/A;  . VIDEO BRONCHOSCOPY WITH ENDOBRONCHIAL ULTRASOUND N/A 04/18/2019   Procedure: VIDEO BRONCHOSCOPY WITH ENDOBRONCHIAL ULTRASOUND;  Surgeon: Delight Ovens, MD;  Location: [facility] OR;  Service: Thoracic;  Laterality: N/A;    Family History  Problem Relation Age of Onset  .  Anuerysm Mother        BRAIN  . Cancer Mother        BREAST  . COPD Father   . Hyperlipidemia Father   . Congestive Heart Failure Father   . Bipolar disorder Sister   . Diabetes Sister   . Fibromyalgia Sister   . Cancer Paternal Uncle        LUNG  . Cancer Maternal Grandmother 46       BREAST  . Cancer Paternal Grandfather        LUNG   Social History   Socioeconomic History  . Marital status: Divorced    Spouse name: Not on file  . Number of children: Not on file  . Years of education: Not on file  . Highest education level: Not on file  Occupational History  . Not on file  Tobacco Use  . Smoking status: Never Smoker  . Smokeless tobacco: Never Used  Vaping Use  . Vaping Use: Never used  Substance and Sexual Activity  . Alcohol use: Yes    Comment: VERY RARE OCCASION  . Drug use: Never  . Sexual activity: Not on file  Other Topics Concern  . Not on file  Social History Narrative  . Not on file   Social Determinants of Health   Financial Resource Strain: Not on file  Food Insecurity: Not on file  Transportation Needs: Not on file  Physical Activity: Not on file  Stress: Not on file  Social Connections: Not on file    Review of Systems  Constitutional: Negative for chills, fatigue and fever.  HENT: Positive for congestion and rhinorrhea. Negative for ear pain and sore throat.   Respiratory: Positive for shortness of breath (Baseline). Negative for cough.   Cardiovascular: Negative for chest pain.  Gastrointestinal: Negative for abdominal pain, constipation, diarrhea, nausea and vomiting.  Genitourinary: Negative for dysuria and urgency.  Musculoskeletal: Negative for back pain and myalgias.  Neurological: Positive for headaches. Negative for dizziness, weakness and light-headedness.       Memory worse  Psychiatric/Behavioral: Negative for dysphoric mood. The patient is not nervous/anxious.      Objective:  BP 128/68   Pulse 82   Temp (!) 97.5 F  (36.4 C)   Ht 5' 10.5" (1.791 m)   Wt 287 lb (130.2 kg)   SpO2 95%   BMI 40.60 kg/m   BP/Weight 02/03/2021 12/03/2020 11/23/2020  Systolic BP 128 - 100  Diastolic BP 68 - 66  Wt. (Lbs) 287 301 301.6  BMI 40.6 42.58 42.66    Physical Exam Vitals reviewed.  Constitutional:      Appearance: Normal appearance.  HENT:     Head: Normocephalic.     Right Ear: Tympanic membrane, ear canal and external ear normal.     Left Ear: Tympanic membrane, ear canal and external ear normal.     Mouth/Throat:     Mouth: Mucous  membranes are moist.     Pharynx: Oropharynx is clear.  Eyes:     Extraocular Movements: Extraocular movements intact.     Conjunctiva/sclera: Conjunctivae normal.     Pupils: Pupils are equal, round, and reactive to light.  Cardiovascular:     Rate and Rhythm: Normal rate and regular rhythm.  Pulmonary:     Effort: Pulmonary effort is normal. No respiratory distress.     Breath sounds: No rales.  Abdominal:     General: Abdomen is flat. Bowel sounds are normal. There is no distension.     Palpations: Abdomen is soft.  Musculoskeletal:        General: Normal range of motion.     Cervical back: Normal range of motion and neck supple.  Skin:    General: Skin is warm and dry.     Capillary Refill: Capillary refill takes less than 2 seconds.  Neurological:     General: No focal deficit present.     Mental Status: She is alert and oriented to person, place, and time.    Depression screen [facility] 2/9 02/03/2021 08/02/2020 04/15/2019  Decreased Interest 1 2 0  Down, Depressed, Hopeless 1 2 0  PHQ - 2 Score 2 4 0  Altered sleeping 2 3 -  Tired, decreased energy 3 3 -  Change in appetite 2 3 -  Feeling bad or failure about yourself  - 2 -  Trouble concentrating 3 3 -  Moving slowly or fidgety/restless 0 1 -  Suicidal thoughts 0 0 -  PHQ-9 Score 12 19 -  Difficult doing work/chores Very difficult (No Data) -     Diabetic Foot Exam - Simple   Simple Foot Form Diabetic  Foot exam was performed with the following findings: Yes 02/03/2021  8:07 AM  Visual Inspection No deformities, no ulcerations, no other skin breakdown bilaterally: Yes Sensation Testing See comments: Yes Pulse Check Posterior Tibialis and Dorsalis pulse intact bilaterally: Yes Comments Decrease monofilament sensation      Lab Results  Component Value Date   WBC 7.5 08/02/2020   HGB 14.3 08/02/2020   HCT 43.3 08/02/2020   PLT 292 08/02/2020   GLUCOSE 132 (H) 08/02/2020   CHOL 217 (H) 08/02/2020   TRIG 183 (H) 08/02/2020   HDL 42 08/02/2020   LDLCALC 142 (H) 08/02/2020   ALT 26 08/02/2020   AST 26 08/02/2020   NA 141 08/02/2020   K 4.6 08/02/2020   CL 102 08/02/2020   CREATININE 0.77 08/02/2020   BUN 11 08/02/2020   CO2 24 08/02/2020   TSH 3.030 08/02/2020   INR 1.0 04/16/2019   HGBA1C 7.0 (H) 08/02/2020   MICROALBUR 80 08/15/2020      Assessment & Plan:   1. Depression, major, recurrent, moderate (HCC) Patient's depression is controlled  with venlafaxine and puproprion.   Anhedonia better.  PHQ 9 was performed score 12. An individual care plan was established or reinforced today.  The patient's disease status was assessed using clinical findings on exam, labs, and or other diagnostic testing to determine patient's success in meeting treatment goals based on disease specific evidence-based guidelines and found to be improving Recommendations include stay on medicine  2. Diabetic glomerulopathy (HCC) - Hemoglobin A1c An individual care plan for diabetes was established and reinforced today.  The patient's status was assessed using clinical findings on exam, labs and diagnostic testing. Patient success at meeting goals based on disease specific evidence-based guidelines and found to be good controlled. Medications  were assessed and patient's understanding of the medical issues , including barriers were assessed. Recommend adherence to a diabetic diet, a graduated exercise  program, HgbA1c level is checked quarterly, and urine microalbumin performed yearly .  Annual mono-filament sensation testing performed. Lower blood pressure and control hyperlipidemia is important. Get annual eye exams and annual flu shots and smoking cessation discussed.  Self management goals were discussed.  3. Morbid obesity (HCC) An individualize plan was formulated for obesity using patient history and physical exam to encourage weight loss.  An evidence based program was formulated.  Patient is to cut portion size with meals and to plan physical exercise 3 days a week at least 20 minutes.  Weight watchers and other programs are helpful.  Planned amount of weight loss 10 lbs.  4. Primary hypertension - CBC with Differential/Platelet - Comprehensive metabolic panel An individual hypertension care plan was established and reinforced today.  The patient's status was assessed using clinical findings on exam and labs or diagnostic tests. The patient's success at meeting treatment goals on disease specific evidence-based guidelines and found to be fair controlled. SELF MANAGEMENT: The patient and I together assessed ways to personally work towards obtaining the recommended goals. RECOMMENDATIONS: avoid decongestants found in common cold remedies, decrease consumption of alcohol, perform routine monitoring of BP with home BP cuff, exercise, reduction of dietary salt, take medicines as prescribed, try not to miss doses and quit smoking.  Regular exercise and maintaining a healthy weight is needed.  Stress reduction may help. A CLINICAL SUMMARY including written plan identify barriers to care unique to individual due to social or financial issues.  We attempt to mutually creat solutions for individual and family understanding.  5. 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  Patient is not having a flair.  Chronic  medicines include symbicort. Addition new medicines none.  Asthma action plan is in place.   6. Diabetic polyneuropathy associated with type 2 diabetes mellitus (HCC) - metFORMIN (GLUCOPHAGE) 1000 MG tablet; Take 1 tablet (1,000 mg total) by mouth 2 (two) times daily with a meal. Please call for a fasting follow up appointment. Thank you, Dr. Sedalia Muta  Dispense: 180 tablet; Refill: 2 - gabapentin (NEURONTIN) 300 MG capsule; Take 2 capsules (600 mg total) by mouth 3 (three) times daily.  Dispense: 540 capsule; Refill: 1  7. Hypothyroidism (acquired) - TSH Patient is known to have hypothyroidism and is on treatment with levothyroxine 25 mcg.  Patient was diagnosed 10 years ago.  Other treatment includes none.  Patient is compliant with medicines and last TSH 6 months ago.  Last TSH was normal.  8. Memory loss - Ambulatory referral to Neurology Patient had Covid 19 in January and was sick and required immunotherapy, she recovered well but continues to have "foggy head" and loss of short term memory  9. Mixed hyperlipidemia - Lipid panel AN INDIVIDUAL CARE PLAN for hyperlipidemia/ cholesterol was established and reinforced today.  The patient's status was assessed using clinical findings on exam, lab and other diagnostic tests. The patient's disease status was assessed based on evidence-based guidelines and found to be fair controlled. MEDICATIONS were reviewed. SELF MANAGEMENT GOALS have been discussed and patient's success at attaining the goal of low cholesterol was assessed. RECOMMENDATION given include regular exercise 3 days a week and low cholesterol/low fat diet. CLINICAL SUMMARY including written plan to identify barriers unique to the patient due to social or economic  reasons was discussed.    Meds ordered this encounter  Medications  . metFORMIN (GLUCOPHAGE) 1000 MG tablet    Sig: Take 1 tablet (1,000 mg total) by mouth 2 (two) times daily with a meal. Please call for a fasting follow up  appointment. Thank you, Dr. Sedalia Muta    Dispense:  180 tablet    Refill:  2  . gabapentin (NEURONTIN) 300 MG capsule    Sig: Take 2 capsules (600 mg total) by mouth 3 (three) times daily.    Dispense:  540 capsule    Refill:  1    Orders Placed This Encounter  Procedures  . CBC with Differential/Platelet  . Comprehensive metabolic panel  . Hemoglobin A1c  . Lipid panel  . TSH  . Ambulatory referral to Neurology     Follow-up: Return in about 3 months (around 05/05/2021) for fasting.  An After Visit Summary was printed and given to the patient.  Brent Bulla, MD [facility] [phone]

## 2021-02-04 ENCOUNTER — Other Ambulatory Visit: Payer: Self-pay

## 2021-02-04 LAB — TSH: TSH: 1.81 u[IU]/mL (ref 0.450–4.500)

## 2021-02-04 LAB — CBC WITH DIFFERENTIAL/PLATELET
Basophils Absolute: 0 10*3/uL (ref 0.0–0.2)
Basos: 1 %
EOS (ABSOLUTE): 0.1 10*3/uL (ref 0.0–0.4)
Eos: 2 %
Hematocrit: 44.5 % (ref 34.0–46.6)
Hemoglobin: 14.9 g/dL (ref 11.1–15.9)
Immature Grans (Abs): 0 10*3/uL (ref 0.0–0.1)
Immature Granulocytes: 0 %
Lymphocytes Absolute: 2 10*3/uL (ref 0.7–3.1)
Lymphs: 24 %
MCH: 30.5 pg (ref 26.6–33.0)
MCHC: 33.5 g/dL (ref 31.5–35.7)
MCV: 91 fL (ref 79–97)
Monocytes Absolute: 0.4 10*3/uL (ref 0.1–0.9)
Monocytes: 5 %
Neutrophils Absolute: 5.6 10*3/uL (ref 1.4–7.0)
Neutrophils: 68 %
Platelets: 254 10*3/uL (ref 150–450)
RBC: 4.89 x10E6/uL (ref 3.77–5.28)
RDW: 11.7 % (ref 11.7–15.4)
WBC: 8.2 10*3/uL (ref 3.4–10.8)

## 2021-02-04 LAB — HEMOGLOBIN A1C
Est. average glucose Bld gHb Est-mCnc: 131 mg/dL
Hgb A1c MFr Bld: 6.2 % — ABNORMAL HIGH (ref 4.8–5.6)

## 2021-02-04 LAB — COMPREHENSIVE METABOLIC PANEL
ALT: 42 IU/L — ABNORMAL HIGH (ref 0–32)
AST: 27 IU/L (ref 0–40)
Albumin/Globulin Ratio: 1.6 (ref 1.2–2.2)
Albumin: 4.5 g/dL (ref 3.8–4.9)
Alkaline Phosphatase: 103 IU/L (ref 44–121)
BUN/Creatinine Ratio: 16 (ref 9–23)
BUN: 13 mg/dL (ref 6–24)
Bilirubin Total: 0.4 mg/dL (ref 0.0–1.2)
CO2: 23 mmol/L (ref 20–29)
Calcium: 9.7 mg/dL (ref 8.7–10.2)
Chloride: 102 mmol/L (ref 96–106)
Creatinine, Ser: 0.82 mg/dL (ref 0.57–1.00)
Globulin, Total: 2.8 g/dL (ref 1.5–4.5)
Glucose: 132 mg/dL — ABNORMAL HIGH (ref 65–99)
Potassium: 4.6 mmol/L (ref 3.5–5.2)
Sodium: 142 mmol/L (ref 134–144)
Total Protein: 7.3 g/dL (ref 6.0–8.5)
eGFR: 85 mL/min/{1.73_m2} (ref >59)

## 2021-02-04 LAB — LIPID PANEL
Chol/HDL Ratio: 5.6 ratio — ABNORMAL HIGH (ref 0.0–4.4)
Cholesterol, Total: 240 mg/dL — ABNORMAL HIGH (ref 100–199)
HDL: 43 mg/dL (ref >39)
LDL Chol Calc (NIH): 163 mg/dL — ABNORMAL HIGH (ref 0–99)
Triglycerides: 182 mg/dL — ABNORMAL HIGH (ref 0–149)
VLDL Cholesterol Cal: 34 mg/dL (ref 5–40)

## 2021-02-04 LAB — CARDIOVASCULAR RISK ASSESSMENT

## 2021-02-04 MED ORDER — ROSUVASTATIN CALCIUM 20 MG PO TABS: 20.0000 mg | ORAL_TABLET | Freq: Every day | ORAL | 1 refills | Status: DC

## 2021-02-04 NOTE — Progress Notes (Signed)
Cbc normal, glucose 132, kidney tests normal, one liver tests slightly high watch, A1c 6.2, triglycerides high at 182, LDL cholesterol 163 suggest change to crestor 20 mg daily, give DASH diet, TSH 1.810 lp

## 2021-02-09 ENCOUNTER — Encounter: Payer: Self-pay | Admitting: Counselor

## 2021-02-10 ENCOUNTER — Telehealth: Payer: Self-pay | Admitting: Adult Health

## 2021-02-10 DIAGNOSIS — G4733 Obstructive sleep apnea (adult) (pediatric): Secondary | ICD-10-CM

## 2021-02-10 NOTE — Telephone Encounter (Signed)
Tammy Parrett is out of the office this week. Dr. Delton Coombes, please advise if you would be okay for [place] placing new order specifying Luna CPAP machine so we can see if pt can get this sooner than either the Resmed or Dreamstation CPAP machines.

## 2021-02-14 NOTE — Telephone Encounter (Signed)
Yes, I am ok with prescribing that way

## 2021-02-14 NOTE — Telephone Encounter (Signed)
I have called and spoke with Holly [name] and she is aware of new order placed to get the pt set up with a LUNA machine for cpap.   Nothing further is needed.

## 2021-02-23 ENCOUNTER — Telehealth: Payer: Self-pay | Admitting: Emergency Medicine

## 2021-02-23 NOTE — Telephone Encounter (Signed)
Called and spoke with patient who states that she is looking for her DME company in [place]. Patient states that she requested one in  but the places she has gone to in [place] have not heard of her. Advised her that her orders were sent to [facility] in February and [facility] last month. Patient states that she is aware of one in [facility] and I verified with her that there is one on [facility]. She expressed understanding and stated she would go to that location. Told her if she needed anything else to let us know. Nothing further needed at this time.

## 2021-02-23 NOTE — Telephone Encounter (Signed)
Spoke with patient who called back stating that she had an appointment with [facility] today at 3pm and got a text message but it had no location so she drove around [place] to the DME companies there with no luck and was told they didn't have anything on her. And then was directed by someone to go to the [facility] location on [facility] and they told her they had nothing on her and that she needed to go somewhere else. So I called Melissa to see what she could find out since the patient said she has a text message about an appointment. Was informed its an Freight forwarder and its at [employer] in [place]. And that the Tech she is supposed to be meeting with is Judeth Cornfield and Efraim Kaufmann was going to reach out to her after we hung up to let her know what is going on. Called and provided patient with this information and apologized to her for all of the confusion. She expressed understanding. Advised her to call if she needed anything else. Nothing further needed at this time.

## 2021-02-25 ENCOUNTER — Telehealth: Payer: Self-pay | Admitting: Emergency Medicine

## 2021-02-28 NOTE — Telephone Encounter (Signed)
I dont see a order

## 2021-02-28 NOTE — Telephone Encounter (Signed)
Pt has already had a sleep study and we have already ordered a cpap for this pt- unclear why we would need to do a repeat sleep study.   lmtcb X1 for pt for more info

## 2021-03-02 ENCOUNTER — Telehealth: Payer: Self-pay | Admitting: Emergency Medicine

## 2021-03-02 NOTE — Telephone Encounter (Signed)
I called and spoke with Holly [name] and she stated that she has been to the office in [place] that she was told to go to to pick up her cpap and that office is a PCP office that does not set up cpaps.  She stated that then she was given an address in [place] to go to and they did not have any information on her picking up a cpap.  She is wanting to know where she needs to go .  I gave her the number to [employer] to call and find out where she needs to pick the cpap up at.  She will call our office if she can find anything out and we can call Melissa to get this straight for her.  Will sign off for now.

## 2021-03-04 ENCOUNTER — Other Ambulatory Visit: Payer: Self-pay | Admitting: Family Medicine

## 2021-03-04 ENCOUNTER — Encounter: Payer: Self-pay | Admitting: Family Medicine

## 2021-03-04 MED ORDER — TRAZODONE HCL 150 MG PO TABS: 150.0000 mg | ORAL_TABLET | Freq: Every day | ORAL | 1 refills | Status: DC

## 2021-03-24 ENCOUNTER — Encounter: Payer: BC Managed Care – PPO | Admitting: Counselor

## 2021-03-31 ENCOUNTER — Encounter: Payer: BC Managed Care – PPO | Admitting: Counselor

## 2021-04-04 ENCOUNTER — Encounter: Payer: Self-pay | Admitting: Family Medicine

## 2021-04-09 IMAGING — CR CHEST - 2 VIEW
2 series · 2 of 2 positions shown · non-contrast
Comparison: Radiograph 02/18/2019

CLINICAL DATA: Pre admit for adenopathy on April 18, 2019, HTN,
diabetic, nonmsoker Tech wore surgical mask/gloves, pt wore mask

EXAM:
CHEST - 2 VIEW

[w chest pa]
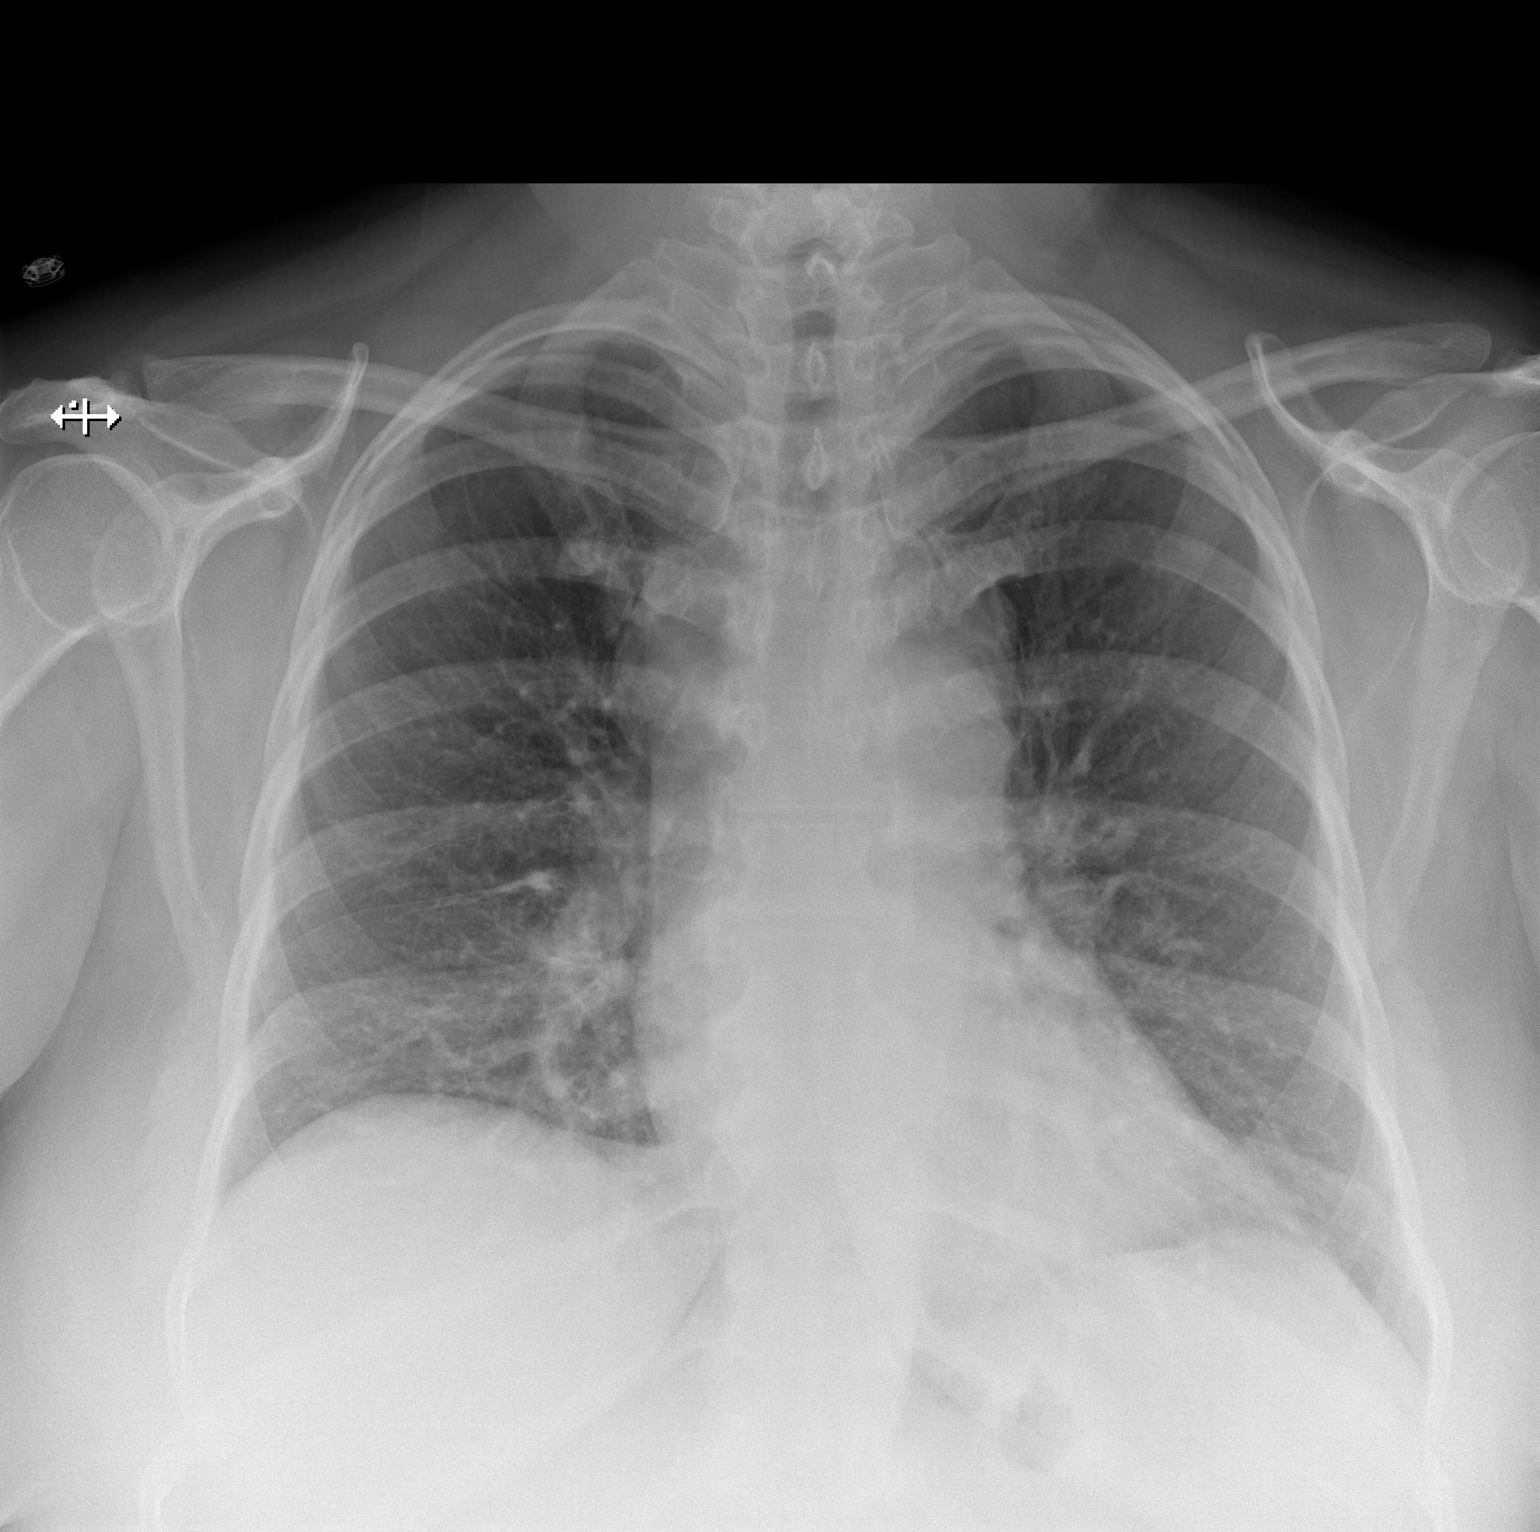

[w chest lat]
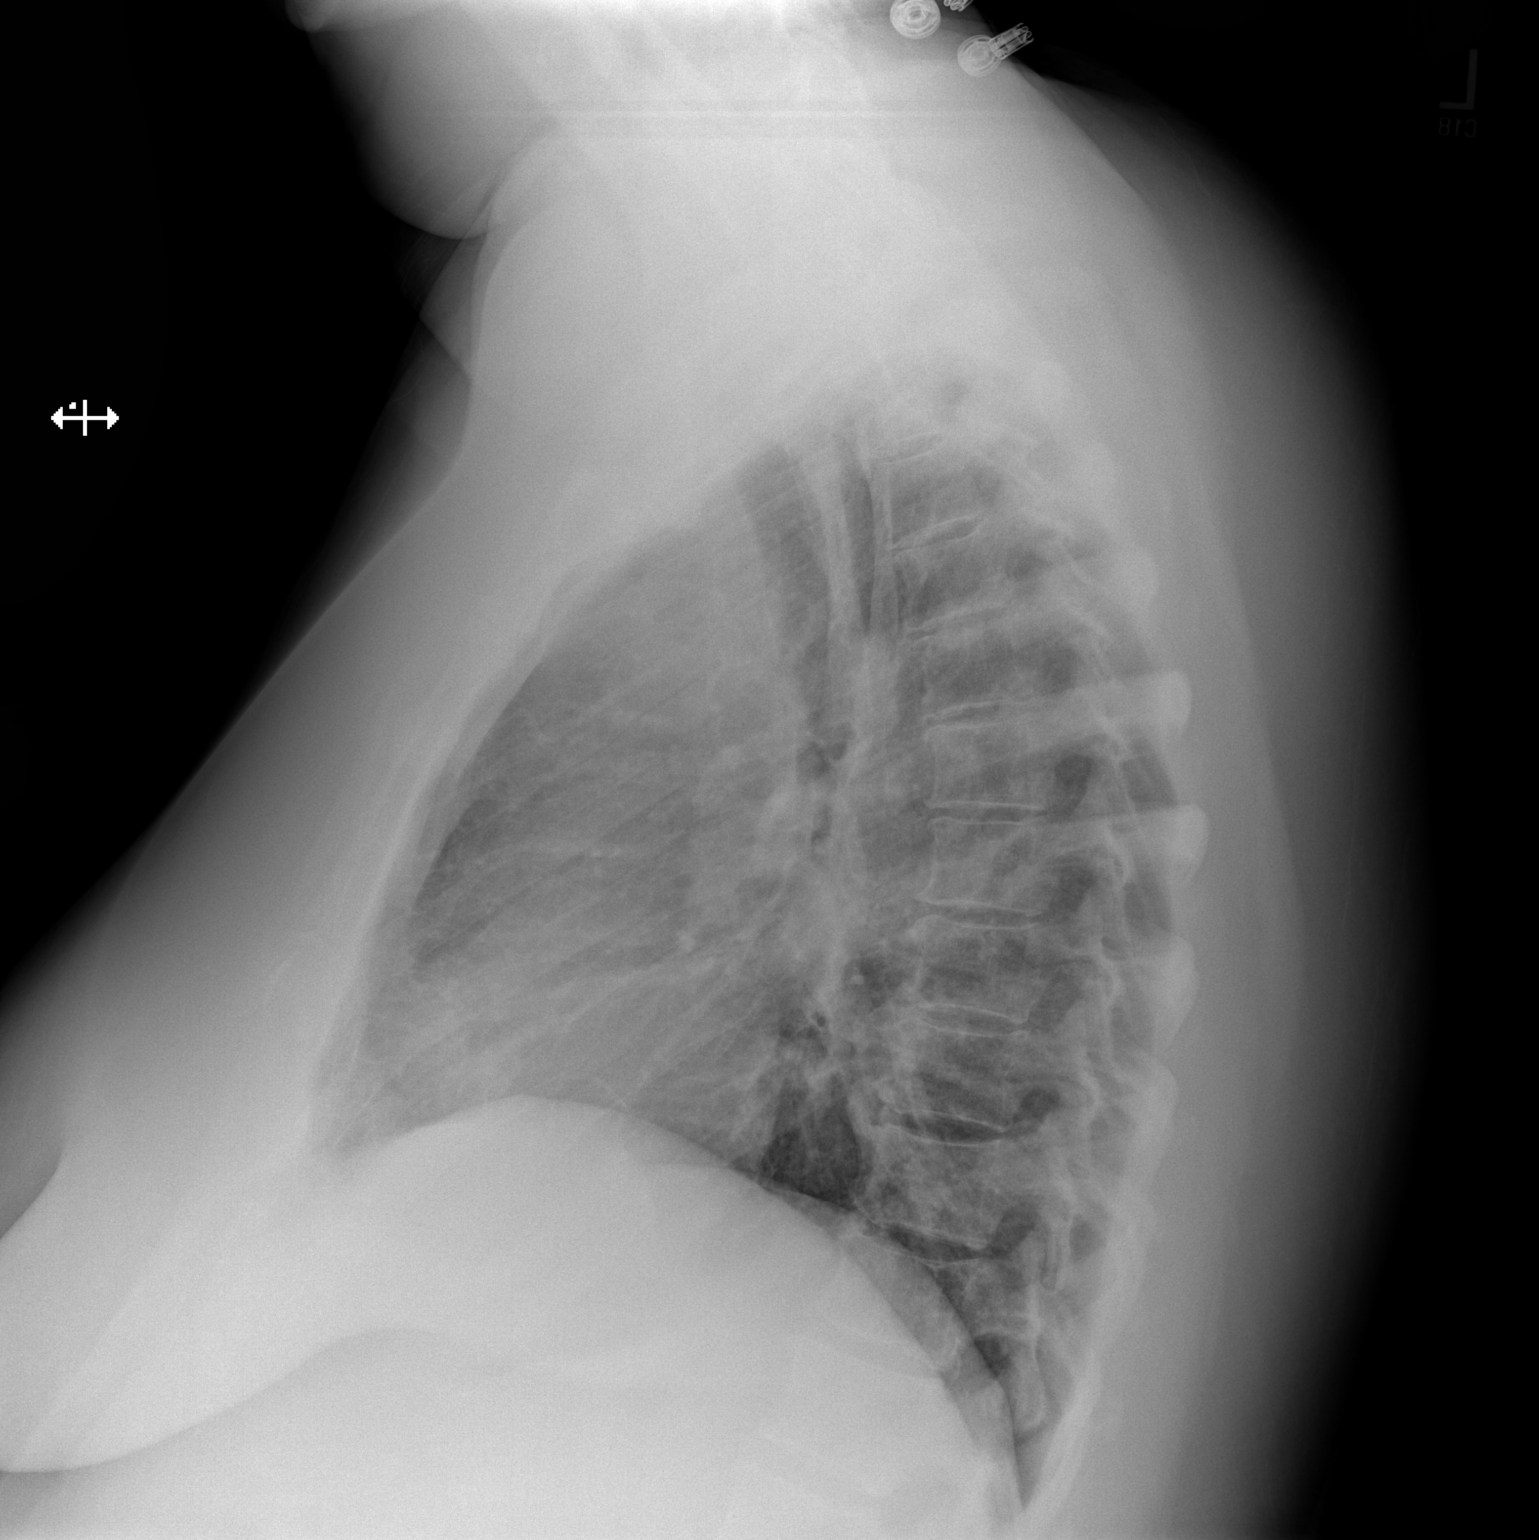

[2 of 2 positions shown; findings below may reference images not displayed]

FINDINGS: Normal cardiac silhouette. Upper mediastinal adenopathy again noted.
No effusion, infiltrate or pneumothorax. No acute osseous
abnormality.
IMPRESSION: 1.  No acute cardiopulmonary process.
2. Mediastinal adenopathy

## 2021-04-11 IMAGING — CR PORTABLE CHEST - 1 VIEW
1 series · 1 of 1 positions shown · non-contrast
Comparison: Radiographs 04/16/2019 and 03/20/2019. CT 03/20/2019.
PET-CT 04/01/2019.

CLINICAL DATA: Postop bronchoscopy and mediastinoscopy.

EXAM:
PORTABLE CHEST 1 VIEW

[AP]
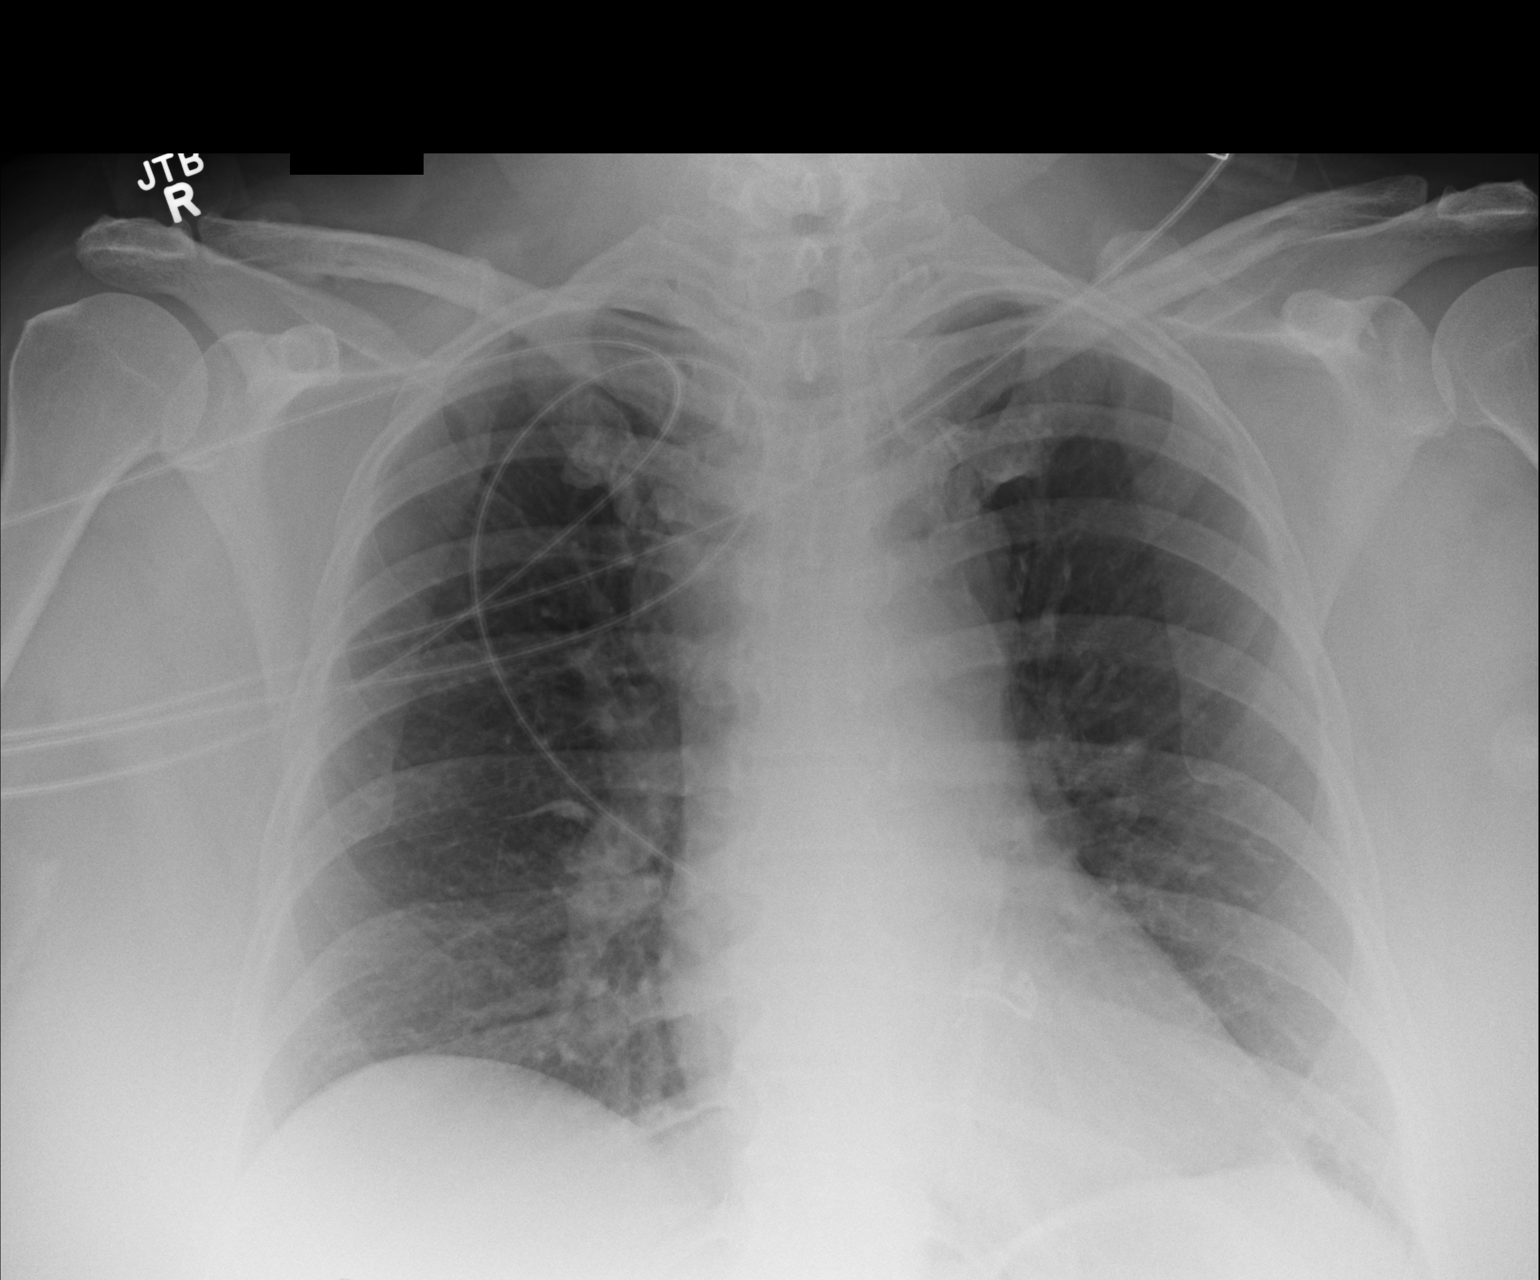

[1 of 1 positions shown; findings below may reference images not displayed]

FINDINGS: 9990 hours. The heart size and mediastinal contours are stable with
grossly stable mediastinal lymphadenopathy. The lungs are clear.
There is no pleural effusion or pneumothorax. The bones appear
unremarkable.
IMPRESSION: No demonstrated complication from procedure. Grossly stable
mediastinal adenopathy.

## 2021-05-11 ENCOUNTER — Ambulatory Visit: Payer: BC Managed Care – PPO | Admitting: Family Medicine

## 2021-05-13 ENCOUNTER — Other Ambulatory Visit: Payer: Self-pay | Admitting: Nurse Practitioner

## 2021-05-13 DIAGNOSIS — U099 Post covid-19 condition, unspecified: Secondary | ICD-10-CM

## 2021-05-13 DIAGNOSIS — J0101 Acute recurrent maxillary sinusitis: Secondary | ICD-10-CM

## 2021-06-06 ENCOUNTER — Other Ambulatory Visit: Payer: Self-pay | Admitting: Family Medicine

## 2021-06-28 ENCOUNTER — Other Ambulatory Visit: Payer: Self-pay | Admitting: Family Medicine

## 2021-08-01 ENCOUNTER — Other Ambulatory Visit: Payer: Self-pay

## 2021-08-01 DIAGNOSIS — Z1231 Encounter for screening mammogram for malignant neoplasm of breast: Secondary | ICD-10-CM

## 2021-08-03 ENCOUNTER — Telehealth: Payer: Self-pay | Admitting: Family Medicine

## 2021-08-03 NOTE — Telephone Encounter (Signed)
   Holly [name] has been scheduled for the following appointment:  WHAT: SCREENING MAMMOGRAM WHERE: [facility] DATE: 08/19/21 TIME: 8:00 AM ARRIVAL TIME  A message has been left for the patient.

## 2021-08-07 ENCOUNTER — Other Ambulatory Visit: Payer: Self-pay | Admitting: Family Medicine

## 2021-08-07 ENCOUNTER — Other Ambulatory Visit: Payer: Self-pay | Admitting: Legal Medicine

## 2021-08-08 ENCOUNTER — Other Ambulatory Visit: Payer: Self-pay | Admitting: Nurse Practitioner

## 2021-08-08 DIAGNOSIS — U099 Post covid-19 condition, unspecified: Secondary | ICD-10-CM

## 2021-08-08 DIAGNOSIS — J0101 Acute recurrent maxillary sinusitis: Secondary | ICD-10-CM

## 2021-08-08 NOTE — Telephone Encounter (Signed)
Patient needs appointment for further refills

## 2021-08-27 ENCOUNTER — Other Ambulatory Visit: Payer: Self-pay | Admitting: Family Medicine

## 2021-08-29 ENCOUNTER — Encounter: Payer: Self-pay | Admitting: Nurse Practitioner

## 2021-08-29 ENCOUNTER — Telehealth (INDEPENDENT_AMBULATORY_CARE_PROVIDER_SITE_OTHER): Payer: BC Managed Care – PPO | Admitting: Nurse Practitioner

## 2021-08-29 DIAGNOSIS — R051 Acute cough: Secondary | ICD-10-CM | POA: Diagnosis not present

## 2021-08-29 DIAGNOSIS — J0181 Other acute recurrent sinusitis: Secondary | ICD-10-CM

## 2021-08-29 MED ORDER — PROMETHAZINE-DM 6.25-15 MG/5ML PO SYRP
5.0000 mL | ORAL_SOLUTION | Freq: Four times a day (QID) | ORAL | 0 refills | Status: DC | PRN
Start: 2021-08-29 — End: 2021-12-21

## 2021-08-29 MED ORDER — AZITHROMYCIN 250 MG PO TABS: ORAL_TABLET | ORAL | 0 refills | Status: AC

## 2021-08-29 NOTE — Progress Notes (Signed)
Virtual Visit via Telephone Note   This visit type was conducted due to national recommendations for restrictions regarding the COVID-19 Pandemic (e.g. social distancing) in an effort to limit this patient's exposure and mitigate transmission in our community.  Due to her co-morbid illnesses, this patient is at least at moderate risk for complications without adequate follow up.  This format is felt to be most appropriate for this patient at this time.  The patient did not have access to video technology/had technical difficulties with video requiring transitioning to audio format only (telephone).  All issues noted in this document were discussed and addressed.  No physical exam could be performed with this format.  Patient verbally consented to a telehealth visit.   Date:  08/29/2021   ID:  Holly [name], DOB [birth year], MRN [id number]  Patient Location: Home Provider Location: Office/Clinic  PCP:  Blane Ohara, MD   Evaluation Performed:  Established patient, acute telemedicine visit    Chief Complaint:  sinus pain  History of Present Illness:    Holly [name] is a 55 y.o. female with   Upper respiratory symptoms She complains of achiness, congestion, facial/teeth pain, and nasal congestion. Onset of symptoms was  10 days ago and the last 3 days have worsened. Treatment has included OTC decongestant and Tylenol. She is drinking plenty of fluids.  Tested negative at Urgent care Friday for flu and covid.Past history is significant for sarcoidosis and chronic allergic rhinitis.     The patient does have symptoms concerning for COVID-19 infection (fever, chills, cough, or new shortness of breath).    Past Medical History:  Diagnosis Date   Anxiety    COVID-19 virus detected 12/30/2019   10/30/2019-SARS-CoV-2-positive Didn't require hospitalization     Depression    Diabetes mellitus without complication (HCC)    Fatty liver    Goiter    Hashimoto's thyroiditis    Hilar adenopathy  03/20/19  04/11/19   CTA CHEST and PET per DR. Anne Ng [facility]   History of kidney stones    Hypertension    Mediastinal adenopathy    PER CTA CHEST PET SCAN per DR. Lanae Crumbly LEWIS   Pneumonia    Supraclavicular adenopathy 03/20/2019   PER CTA CHEST and PET per DR. Sudie Bailey [facility]    Past Surgical History:  Procedure Laterality Date   ABDOMINAL HYSTERECTOMY     COMPLETE   ADHESIOLYSIS     APPENDECTOMY     CESAREAN SECTION     MEDIASTINOSCOPY N/A 04/18/2019   Procedure: MEDIASTINOSCOPY;  Surgeon: Delight Ovens, MD;  Location: [facility] OR;  Service: Thoracic;  Laterality: N/A;   VIDEO BRONCHOSCOPY WITH ENDOBRONCHIAL ULTRASOUND N/A 04/18/2019   Procedure: VIDEO BRONCHOSCOPY WITH ENDOBRONCHIAL ULTRASOUND;  Surgeon: Delight Ovens, MD;  Location: [facility] OR;  Service: Thoracic;  Laterality: N/A;    Family History  Problem Relation Age of Onset   Anuerysm Mother        BRAIN   Cancer Mother        BREAST   COPD Father    Hyperlipidemia Father    Congestive Heart Failure Father    Bipolar disorder Sister    Diabetes Sister    Fibromyalgia Sister    Cancer Paternal Uncle        LUNG   Cancer Maternal Grandmother 15       BREAST   Cancer Paternal Grandfather        LUNG    Social History   Socioeconomic History  Marital status: Divorced    Spouse name: Not on file   Number of children: Not on file   Years of education: Not on file   Highest education level: Not on file  Occupational History   Not on file  Tobacco Use   Smoking status: Never   Smokeless tobacco: Never  Vaping Use   Vaping Use: Never used  Substance and Sexual Activity   Alcohol use: Yes    Comment: VERY RARE OCCASION   Drug use: Never   Sexual activity: Not on file  Other Topics Concern   Not on file  Social History Narrative   Not on file   Social Determinants of Health   Financial Resource Strain: Not on file  Food Insecurity: Not on file  Transportation Needs: Not on file  Physical Activity:  Not on file  Stress: Not on file  Social Connections: Not on file  Intimate Partner Violence: Not on file    Outpatient Medications Prior to Visit  Medication Sig Dispense Refill   albuterol (VENTOLIN HFA) 108 (90 Base) MCG/ACT inhaler Inhale 2 puffs into the lungs every 6 (six) hours as needed for wheezing or shortness of breath. 18 g 2   budesonide-formoterol (SYMBICORT) 160-4.5 MCG/ACT inhaler Inhale 2 puffs into the lungs 2 (two) times daily. 1 each 3   buPROPion (WELLBUTRIN XL) 300 MG 24 hr tablet TAKE 1 TABLET BY MOUTH EVERY DAY 90 tablet 0   famotidine (PEPCID) 40 MG tablet TAKE 1 TABLET BY MOUTH TWICE A DAY 180 tablet 1   fluticasone (FLONASE) 50 MCG/ACT nasal spray Place 2 sprays into both nostrils daily. 16 g 6   gabapentin (NEURONTIN) 300 MG capsule Take 2 capsules (600 mg total) by mouth 3 (three) times daily. 540 capsule 1   irbesartan (AVAPRO) 75 MG tablet TAKE 1 TABLET BY MOUTH EVERY DAY 90 tablet 0   levothyroxine (SYNTHROID) 25 MCG tablet TAKE 1 TABLET BY MOUTH DAILY BEFORE BREAKFAST. 90 tablet 0   loratadine (CLARITIN) 10 MG tablet TAKE 1 TABLET BY MOUTH EVERY DAY 90 tablet 0   metFORMIN (GLUCOPHAGE) 1000 MG tablet Take 1 tablet (1,000 mg total) by mouth 2 (two) times daily with a meal. Please call for a fasting follow up appointment. Thank you, Dr. Sedalia Muta 180 tablet 2   rosuvastatin (CRESTOR) 20 MG tablet TAKE 1 TABLET BY MOUTH EVERY DAY 90 tablet 1   SODIUM FLUORIDE 5000 PPM 1.1 % PSTE See admin instructions.     traZODone (DESYREL) 150 MG tablet TAKE 1 TABLET BY MOUTH AT BEDTIME. 90 tablet 0   venlafaxine XR (EFFEXOR-XR) 75 MG 24 hr capsule TAKE 3 CAPSULES BY MOUTH DAILY IN THE MORNING 270 capsule 1   No facility-administered medications prior to visit.    Allergies:   Avelox [moxifloxacin hcl in nacl], Cymbalta [duloxetine hcl], and Oxycodone-acetaminophen   Social History   Tobacco Use   Smoking status: Never   Smokeless tobacco: Never  Vaping Use   Vaping Use:  Never used  Substance Use Topics   Alcohol use: Yes    Comment: VERY RARE OCCASION   Drug use: Never     Review of Systems  Constitutional:  Positive for malaise/fatigue. Negative for chills and fever.  HENT:  Positive for congestion, ear pain (Right), sinus pain and sore throat (PND).   Respiratory:  Positive for cough (Productive), sputum production (White/Yellow) and shortness of breath.   Cardiovascular:  Positive for chest pain.  Gastrointestinal:  Positive for nausea. Negative  for abdominal pain and vomiting.  Musculoskeletal:  Positive for back pain. Negative for myalgias.  Neurological:  Positive for headaches.    Labs/Other Tests and Data Reviewed:    Recent Labs: 02/03/2021: ALT 42; BUN 13; Creatinine, Ser 0.82; Hemoglobin 14.9; Platelets 254; Potassium 4.6; Sodium 142; TSH 1.810   Recent Lipid Panel Lab Results  Component Value Date/Time   CHOL 240 (H) 02/03/2021 08:36 AM   TRIG 182 (H) 02/03/2021 08:36 AM   HDL 43 02/03/2021 08:36 AM   CHOLHDL 5.6 (H) 02/03/2021 08:36 AM   LDLCALC 163 (H) 02/03/2021 08:36 AM    Wt Readings from Last 3 Encounters:  02/03/21 287 lb (130.2 kg)  12/03/20 (!) 301 lb (136.5 kg)  11/23/20 (!) 301 lb 9.6 oz (136.8 kg)     Objective:    Vital Signs:  There were no vitals taken for this visit.    Physical Exam No physical exam due to telemedicine visit  ASSESSMENT & PLAN:     1. Other acute recurrent sinusitis - azithromycin (ZITHROMAX) 250 MG tablet; Take 2 tablets on day 1, then 1 tablet daily on days 2 through 5  Dispense: 6 tablet; Refill: 0 - promethazine-dextromethorphan (PROMETHAZINE-DM) 6.25-15 MG/5ML syrup; Take 5 mLs by mouth 4 (four) times daily as needed.  Dispense: 118 mL; Refill: 0  2. Acute cough - promethazine-dextromethorphan (PROMETHAZINE-DM) 6.25-15 MG/5ML syrup; Take 5 mLs by mouth 4 (four) times daily as needed.  Dispense: 118 mL; Refill: 0       COVID-19 Education: The signs and symptoms of COVID-19 were  discussed with the patient and how to seek care for testing (follow up with PCP or arrange E-visit). The importance of social distancing was discussed today.   I spent 10 minutes dedicated to the care of this patient on the date of this encounter to include face-to-face time with the patient, as well as: EMR and prescription medication management.  I, Janie Morning, NP, have reviewed all documentation for this visit. The documentation on 08/29/21 for the exam, diagnosis, procedures, and orders are all accurate and complete.   Follow Up:  In Person prn  Signed,  Flonnie Hailstone, DNP  08/29/2021 9:37 AM    [facility]

## 2021-08-30 NOTE — Telephone Encounter (Signed)
Patient no showed mammogram appointment on 10/28

## 2021-09-07 ENCOUNTER — Encounter: Payer: Self-pay | Admitting: Nurse Practitioner

## 2021-09-07 ENCOUNTER — Other Ambulatory Visit: Payer: Self-pay | Admitting: Nurse Practitioner

## 2021-09-07 ENCOUNTER — Encounter: Payer: Self-pay | Admitting: Family Medicine

## 2021-09-07 DIAGNOSIS — J0181 Other acute recurrent sinusitis: Secondary | ICD-10-CM

## 2021-09-07 MED ORDER — AMOXICILLIN-POT CLAVULANATE 875-125 MG PO TABS: 1.0000 | ORAL_TABLET | Freq: Two times a day (BID) | ORAL | 0 refills | Status: DC

## 2021-09-27 IMAGING — CT CT CERVICAL SPINE W/O CM
3 of 4 series · 9 of 33 positions shown, 10 images · non-contrast
Comparison: None.

CLINICAL DATA: Acute pain due to trauma

EXAM:
CT HEAD WITHOUT CONTRAST
CT CERVICAL SPINE WITHOUT CONTRAST
TECHNIQUE: Multidetector CT imaging of the head and cervical spine was
performed following the standard protocol without intravenous
contrast. Multiplanar CT image reconstructions of the cervical spine
were also generated.

[Series 4: sagittal bone · sagittal · 0.19mm/px · 5 of 61 slices shown]
[im 21/61  bone]
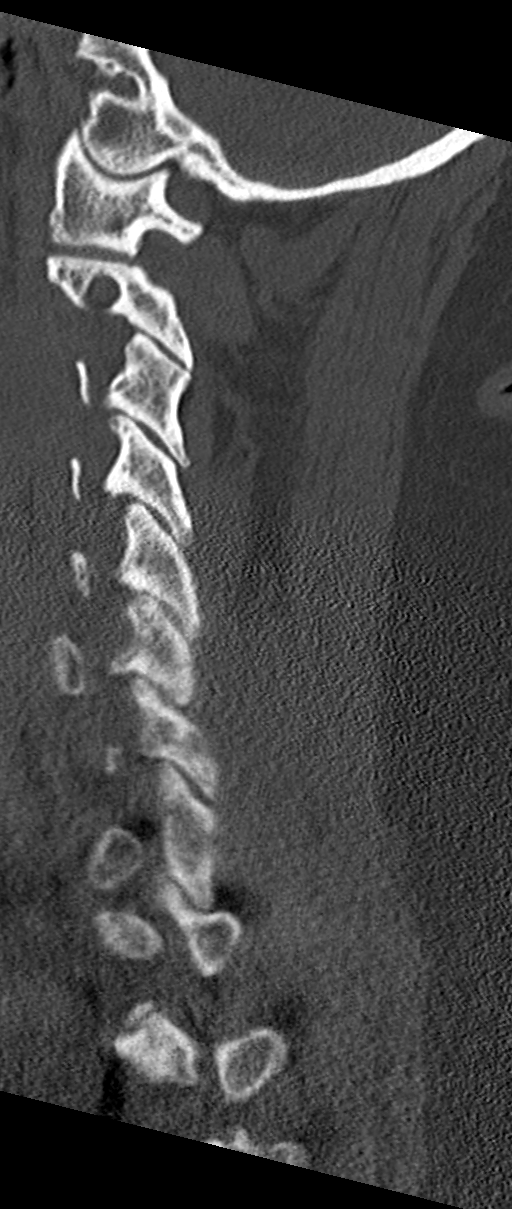
[im 26/61  bone]
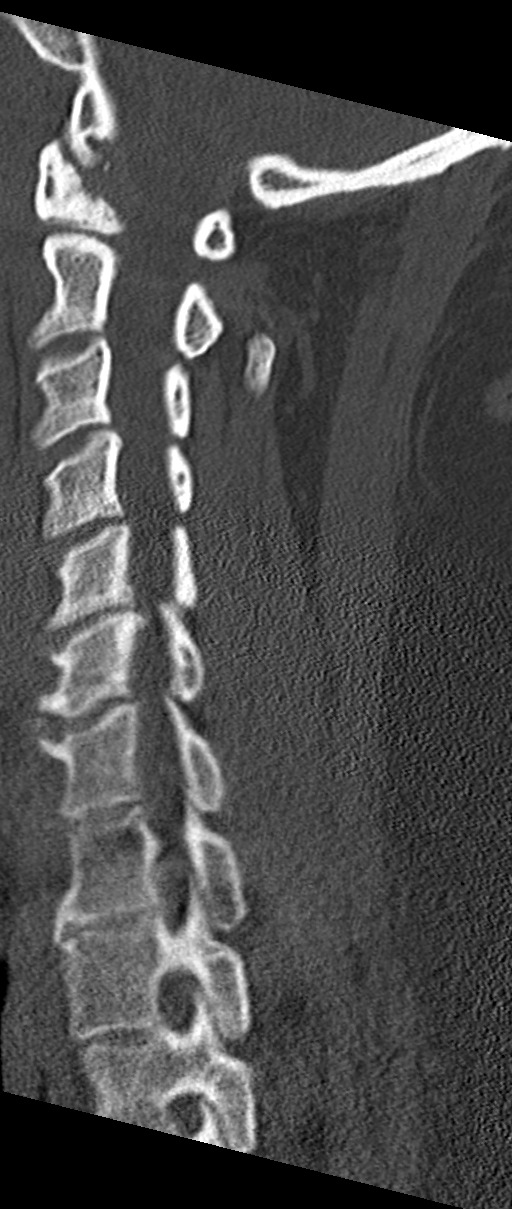
[im 31/61  bone]
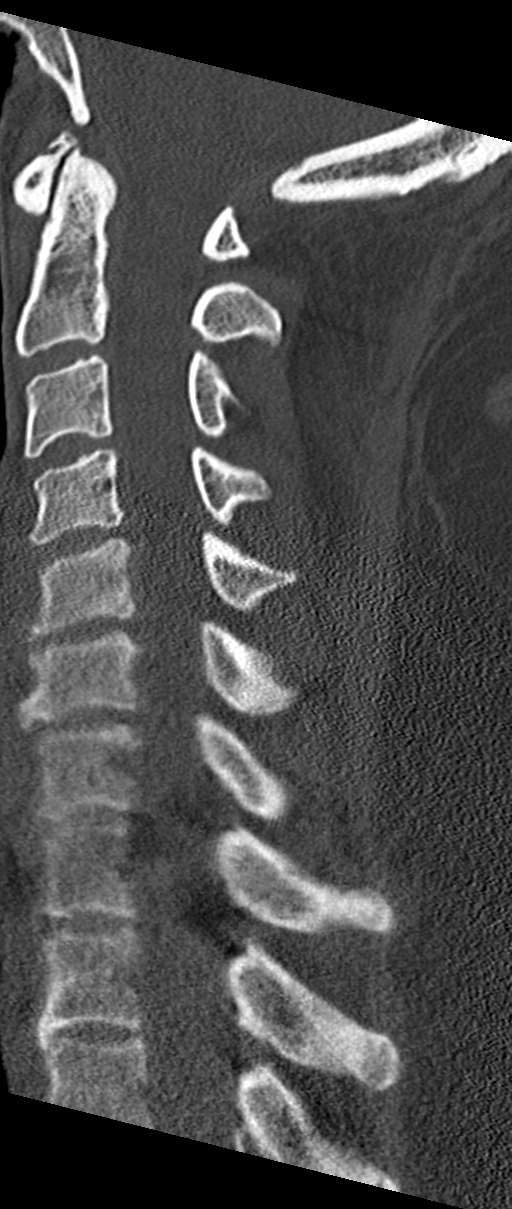
[im 36/61  bone]
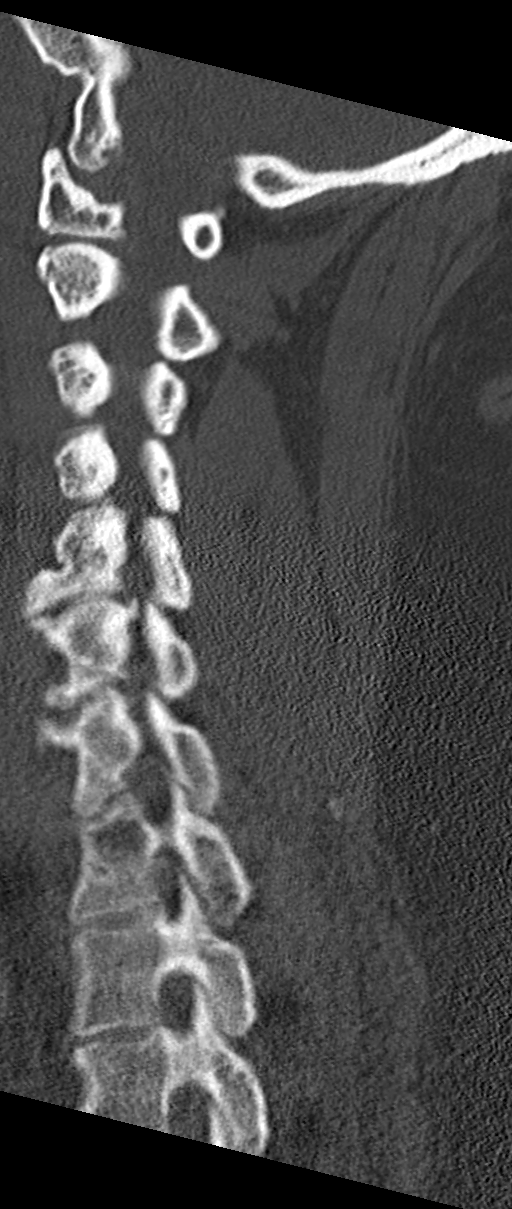
[im 41/61  bone]
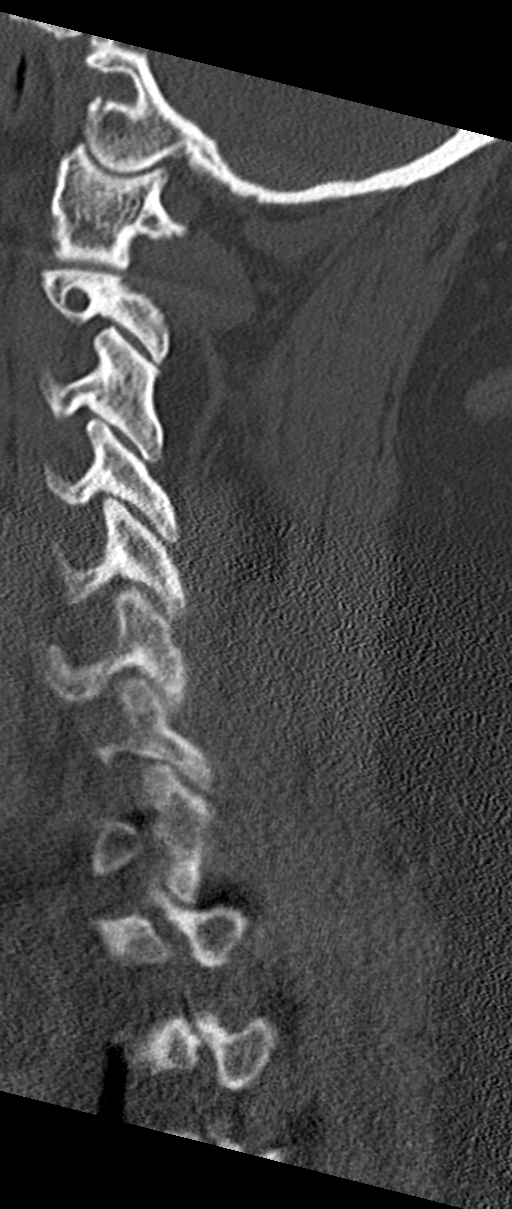

[Series 5: coronal bone · coronal · 0.23mm/px · 3 of 49 slices shown]
[im 10/49  bone]
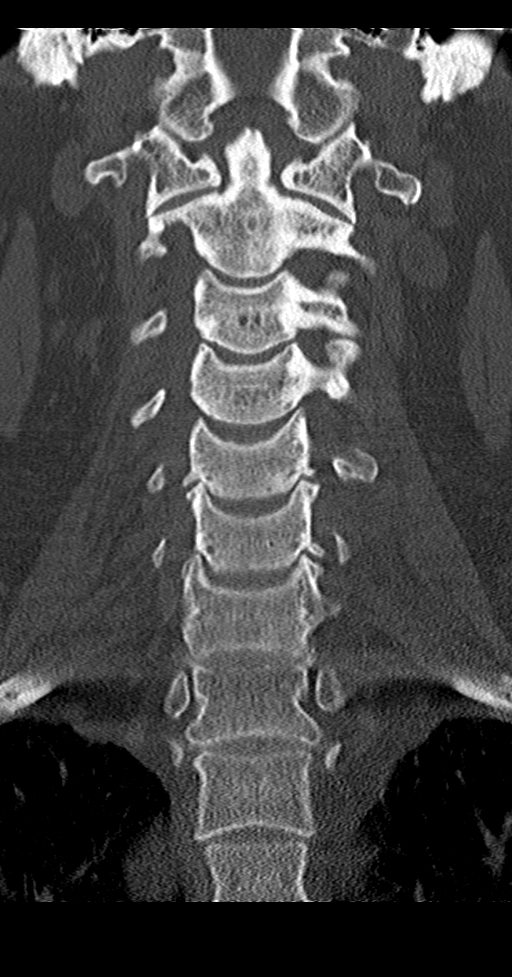
[im 20/49  bone]
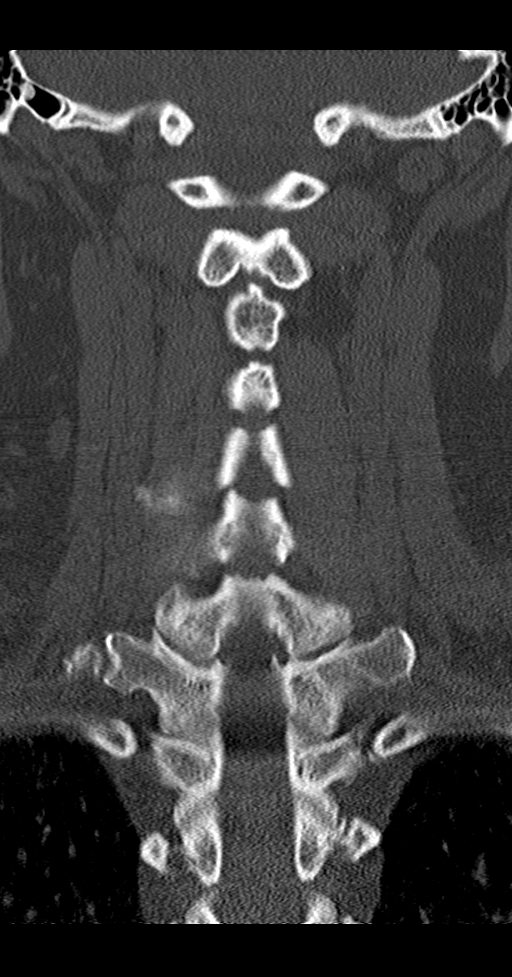
[im 29/49  bone]
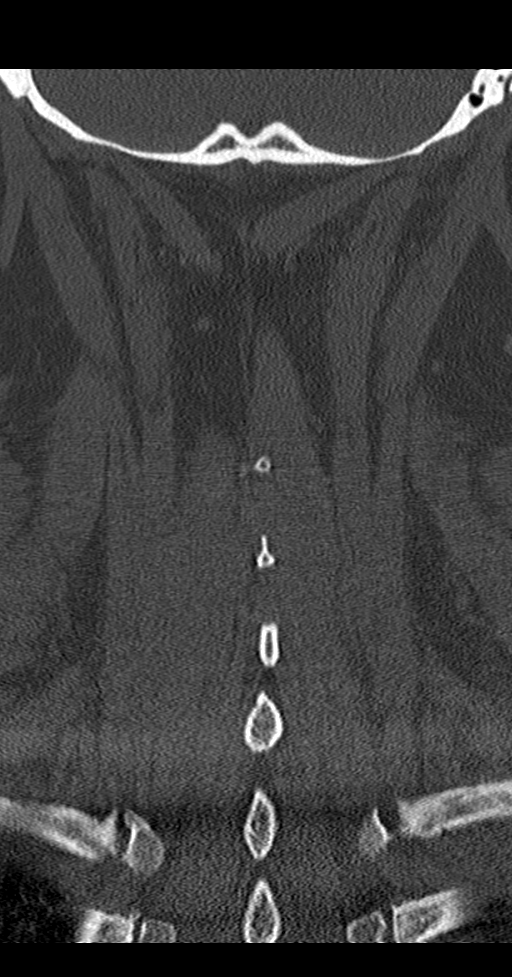

[Series 6: orthogonal bone · axial · 0.19mm/px · z∈[+348,+348]mm · 1 of 114 slices shown, 2 images]
[im 57/114  soft-tissue]
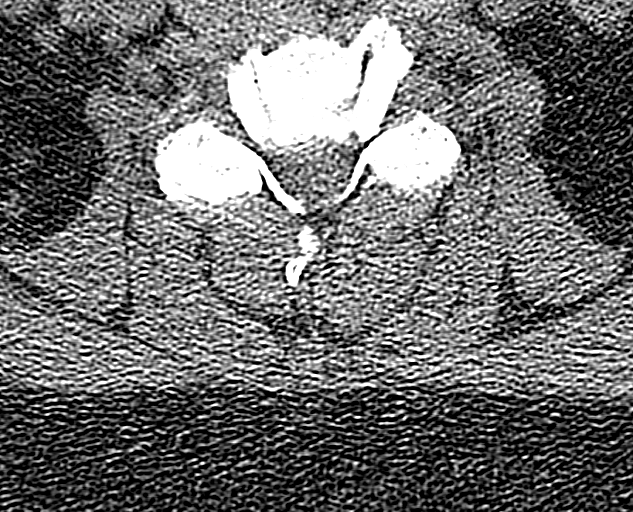
[im 57/114  bone]
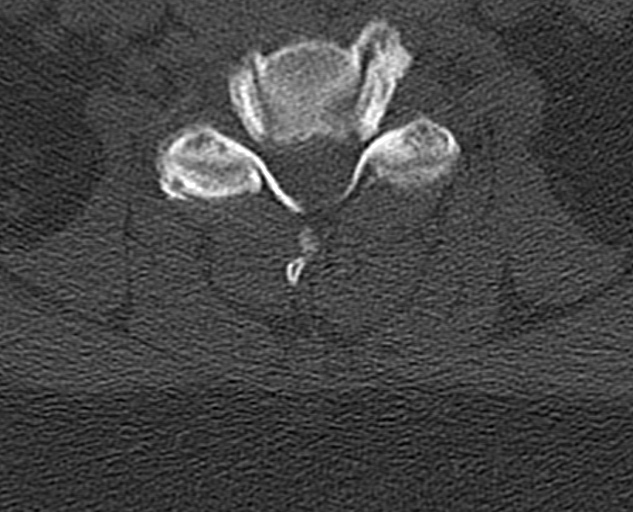

[9 of 33 positions shown; findings below may reference images not displayed]

FINDINGS: CT HEAD FINDINGS

Brain: No evidence of acute infarction, hemorrhage, hydrocephalus,
extra-axial collection or mass lesion/mass effect.

Vascular: No hyperdense vessel or unexpected calcification.

Skull: Normal. Negative for fracture or focal lesion.

Sinuses/Orbits: No acute finding.

Other: None.

CT CERVICAL SPINE FINDINGS

Alignment: There is straightening of the normal cervical lordotic
curvature.

Skull base and vertebrae: No acute fracture. No primary bone lesion
or focal pathologic process.

Soft tissues and spinal canal: No prevertebral fluid or swelling. No
visible canal hematoma.

Disc levels: The disc heights are relatively well preserved with
minimal multilevel disc height loss.

Upper chest: Negative.

Other: Thyroid gland is diffusely enlarged. There is a calcification
in the left hemi thyroid measuring approximately 1.1 cm. This is not
significantly changed from prior chest CT.
IMPRESSION: 1. No CT evidence for acute intracranial pathology.
2. Straightening of the normal cervical lordotic curvature may be
due to positioning or muscle spasm.
3. No evidence for acute fracture or subluxation of the cervical
spine.
4. Goiter.

## 2021-11-14 ENCOUNTER — Encounter: Payer: Self-pay | Admitting: Family Medicine

## 2021-11-21 NOTE — Telephone Encounter (Signed)
Pt given [employer]'s contact information. Order for CPAP placed in May 2022. Nothing further needed at this time.

## 2021-11-22 ENCOUNTER — Other Ambulatory Visit: Payer: Self-pay | Admitting: Legal Medicine

## 2021-11-22 DIAGNOSIS — E1142 Type 2 diabetes mellitus with diabetic polyneuropathy: Secondary | ICD-10-CM

## 2021-11-24 ENCOUNTER — Other Ambulatory Visit: Payer: Self-pay | Admitting: Family Medicine

## 2021-11-26 ENCOUNTER — Other Ambulatory Visit: Payer: Self-pay | Admitting: Legal Medicine

## 2021-11-26 ENCOUNTER — Other Ambulatory Visit: Payer: Self-pay | Admitting: Family Medicine

## 2021-11-26 DIAGNOSIS — E1142 Type 2 diabetes mellitus with diabetic polyneuropathy: Secondary | ICD-10-CM

## 2021-12-21 ENCOUNTER — Other Ambulatory Visit: Payer: Self-pay

## 2021-12-21 ENCOUNTER — Encounter: Payer: Self-pay | Admitting: Nurse Practitioner

## 2021-12-21 ENCOUNTER — Ambulatory Visit: Payer: BC Managed Care – PPO | Admitting: Nurse Practitioner

## 2021-12-21 ENCOUNTER — Ambulatory Visit
Admission: RE | Admit: 2021-12-21 | Discharge: 2021-12-21 | Disposition: A | Discharge status: Home / Self Care | Payer: BC Managed Care – PPO | Source: Ambulatory Visit | Attending: Family Medicine | Admitting: Family Medicine

## 2021-12-21 ENCOUNTER — Other Ambulatory Visit: Payer: Self-pay | Admitting: Family Medicine

## 2021-12-21 VITALS — BP 134/82 | HR 81 | Temp 97.3°F | Ht 70.5 in | Wt 307.0 lb

## 2021-12-21 DIAGNOSIS — E782 Mixed hyperlipidemia: Secondary | ICD-10-CM

## 2021-12-21 DIAGNOSIS — E1165 Type 2 diabetes mellitus with hyperglycemia: Secondary | ICD-10-CM

## 2021-12-21 DIAGNOSIS — E039 Hypothyroidism, unspecified: Secondary | ICD-10-CM

## 2021-12-21 DIAGNOSIS — R42 Dizziness and giddiness: Secondary | ICD-10-CM

## 2021-12-21 DIAGNOSIS — Z6841 Body Mass Index (BMI) 40.0 and over, adult: Secondary | ICD-10-CM

## 2021-12-21 DIAGNOSIS — G4733 Obstructive sleep apnea (adult) (pediatric): Secondary | ICD-10-CM

## 2021-12-21 DIAGNOSIS — R110 Nausea: Secondary | ICD-10-CM

## 2021-12-21 DIAGNOSIS — I10 Essential (primary) hypertension: Secondary | ICD-10-CM

## 2021-12-21 DIAGNOSIS — R413 Other amnesia: Secondary | ICD-10-CM

## 2021-12-21 DIAGNOSIS — E1142 Type 2 diabetes mellitus with diabetic polyneuropathy: Secondary | ICD-10-CM | POA: Diagnosis not present

## 2021-12-21 DIAGNOSIS — Z1231 Encounter for screening mammogram for malignant neoplasm of breast: Secondary | ICD-10-CM

## 2021-12-21 MED ORDER — IRBESARTAN 75 MG PO TABS: 75.0000 mg | ORAL_TABLET | Freq: Every day | ORAL | 0 refills | Status: DC

## 2021-12-21 MED ORDER — OZEMPIC (0.25 OR 0.5 MG/DOSE) 2 MG/1.5ML ~~LOC~~ SOPN: 0.2500 mg | PEN_INJECTOR | SUBCUTANEOUS | 0 refills | Status: DC

## 2021-12-21 MED ORDER — ONDANSETRON HCL 4 MG PO TABS: 4.0000 mg | ORAL_TABLET | Freq: Three times a day (TID) | ORAL | 1 refills | Status: DC | PRN

## 2021-12-21 MED ORDER — GABAPENTIN 300 MG PO CAPS: 300.0000 mg | ORAL_CAPSULE | Freq: Two times a day (BID) | ORAL | 3 refills | Status: DC

## 2021-12-21 NOTE — Progress Notes (Signed)
Subjective:  Patient ID: Holly [name], female    DOB: June 20, 1966  Age: 56 y.o. MRN: [id number]  Chief Complaint  Patient presents with   Diabetes   Hyperlipidemia   Hypertension    HPI  Holly [name] is a 56 year old Caucasian female that presents for follow-up of type 2 DM, hypertension, and hyperlipidemia. States she is experiencing intermittent dizziness. Reposrt she decreased Gabapentin for diabetic neuropathy to 600 mg BID rather than TID.  Holly [name] had screening mammogram this morning. Routine eye exam 2022. Colonoscopy in 2018 with Dr Lyda Jester revealed diverticulosis. Denies history or current symptoms of diverticulitis.   Holly [name] has diagnosed OSA per sleep study results. Reports she has been unable to obtain CPAP machine. She has requested assistance with obtaining CPAP machine due to chronic fatigue and multiple co-morbidities.   Diabetes Mellitus Type II, Follow-up  Lab Results  Component Value Date   HGBA1C 6.2 (H) 02/03/2021   HGBA1C 7.0 (H) 08/02/2020   HGBA1C 6.9 (H) 04/16/2019   Wt Readings from Last 3 Encounters:  02/03/21 287 lb (130.2 kg)  12/03/20 (!) 301 lb (136.5 kg)  11/23/20 (!) 301 lb 9.6 oz (136.8 kg)   Last seen for diabetes 11 months ago.  Management since then includes Metformin 1,000 mg BID. She reports good compliance with treatment. She is not having side effects.  Symptoms: Yes fatigue No foot ulcerations  No appetite changes No nausea  Yes paresthesia of the feet  Yes polydipsia  No polyuria No visual disturbances   No vomiting     Home blood sugar records:  130s  Episodes of hypoglycemia? No    Current insulin regiment: none Most Recent Eye Exam: 2022 Current exercise: none Current diet habits: in general, a "healthy" diet    Pertinent Labs: Lab Results  Component Value Date   CHOL 240 (H) 02/03/2021   HDL 43 02/03/2021   LDLCALC 163 (H) 02/03/2021   TRIG 182 (H) 02/03/2021   CHOLHDL 5.6 (H) 02/03/2021   Lab Results  Component Value  Date   NA 142 02/03/2021   K 4.6 02/03/2021   CREATININE 0.82 02/03/2021   EGFR 85 02/03/2021   GFRNONAA 88 08/02/2020   GLUCOSE 132 (H) 02/03/2021       Lipid/Cholesterol, Follow-up  Last lipid panel Other pertinent labs  Lab Results  Component Value Date   CHOL 240 (H) 02/03/2021   HDL 43 02/03/2021   LDLCALC 163 (H) 02/03/2021   TRIG 182 (H) 02/03/2021   CHOLHDL 5.6 (H) 02/03/2021   Lab Results  Component Value Date   ALT 42 (H) 02/03/2021   AST 27 02/03/2021   PLT 254 02/03/2021   TSH 1.810 02/03/2021     She was last seen for this 11 months ago.  Management includes Crestor 20 mg daily.  She reports excellent compliance with treatment. She is not having side effects.    Current diet: well balanced Current exercise: none  Hypertension, follow-up:  She was last seen for hypertension 11 months ago.  BP at that visit was 126/68. Management includes Avapro 75 mg daily.  She reports excellent compliance with treatment. She is not having side effects. She is following a Regular diet. She is not exercising. She does not smoke.  Use of agents associated with hypertension: NSAIDS.   Outside blood pressures are not being checked. Pertinent labs: Lab Results  Component Value Date   CHOL 240 (H) 02/03/2021   HDL 43 02/03/2021   LDLCALC 163 (H) 02/03/2021  TRIG 182 (H) 02/03/2021   CHOLHDL 5.6 (H) 02/03/2021   Lab Results  Component Value Date   NA 142 02/03/2021   K 4.6 02/03/2021   CREATININE 0.82 02/03/2021   EGFR 85 02/03/2021   GFRNONAA 88 08/02/2020   GLUCOSE 132 (H) 02/03/2021     The 10-year ASCVD risk score (Arnett DK, et al., 2019) is: 9.1%        Current Outpatient Medications on File Prior to Visit  Medication Sig Dispense Refill   albuterol (VENTOLIN HFA) 108 (90 Base) MCG/ACT inhaler Inhale 2 puffs into the lungs every 6 (six) hours as needed for wheezing or shortness of breath. 18 g 2   budesonide-formoterol (SYMBICORT) 160-4.5  MCG/ACT inhaler Inhale 2 puffs into the lungs 2 (two) times daily. 1 each 3   buPROPion (WELLBUTRIN XL) 300 MG 24 hr tablet TAKE 1 TABLET BY MOUTH EVERY DAY 90 tablet 0   famotidine (PEPCID) 40 MG tablet TAKE 1 TABLET BY MOUTH TWICE A DAY 180 tablet 1   fluticasone (FLONASE) 50 MCG/ACT nasal spray Place 2 sprays into both nostrils daily. 16 g 6   gabapentin (NEURONTIN) 300 MG capsule TAKE 2 CAPSULES BY MOUTH 3 TIMES DAILY. 540 capsule 1   irbesartan (AVAPRO) 75 MG tablet TAKE 1 TABLET BY MOUTH EVERY DAY 90 tablet 0   levothyroxine (SYNTHROID) 25 MCG tablet TAKE 1 TABLET BY MOUTH EVERY DAY BEFORE BREAKFAST 90 tablet 0   loratadine (CLARITIN) 10 MG tablet TAKE 1 TABLET BY MOUTH EVERY DAY 90 tablet 0   metFORMIN (GLUCOPHAGE) 1000 MG tablet TAKE 1 TABLET (1,000 MG TOTAL) BY MOUTH 2 (TWO) TIMES DAILY WITH A MEAL. PLEASE CALL FOR A FASTING FOLLOW UP APPOINTMENT. THANK YOU, DR. COX 180 tablet 2   rosuvastatin (CRESTOR) 20 MG tablet TAKE 1 TABLET BY MOUTH EVERY DAY 90 tablet 1   SODIUM FLUORIDE 5000 PPM 1.1 % PSTE See admin instructions.     traZODone (DESYREL) 150 MG tablet TAKE 1 TABLET BY MOUTH EVERYDAY AT BEDTIME 90 tablet 0   venlafaxine XR (EFFEXOR-XR) 75 MG 24 hr capsule TAKE 3 CAPSULES BY MOUTH DAILY IN THE MORNING 270 capsule 1   No current facility-administered medications on file prior to visit.   Past Medical History:  Diagnosis Date   Anxiety    COVID-19 virus detected 12/30/2019   10/30/2019-SARS-CoV-2-positive Didn't require hospitalization     Depression    Diabetes mellitus without complication ([place])    Fatty liver    Goiter    Hashimoto's thyroiditis    Hilar adenopathy 03/20/19  04/11/19   CTA CHEST and PET per DR. Lauretta Chester [facility]   History of kidney stones    Hypertension    Mediastinal adenopathy    PER CTA CHEST PET SCAN per DR. Beryle Lathe [facility]   Pneumonia    Supraclavicular adenopathy 03/20/2019   PER CTA CHEST and PET per DR. Renelda Mom [facility]   Past Surgical History:   Procedure Laterality Date   ABDOMINAL HYSTERECTOMY     COMPLETE   ADHESIOLYSIS     APPENDECTOMY     CESAREAN SECTION     MEDIASTINOSCOPY N/A 04/18/2019   Procedure: MEDIASTINOSCOPY;  Surgeon: Grace Isaac, MD;  Location: [facility];  Service: Thoracic;  Laterality: N/A;   VIDEO BRONCHOSCOPY WITH ENDOBRONCHIAL ULTRASOUND N/A 04/18/2019   Procedure: VIDEO BRONCHOSCOPY WITH ENDOBRONCHIAL ULTRASOUND;  Surgeon: Grace Isaac, MD;  Location: [facility];  Service: Thoracic;  Laterality: N/A;    Family History  Problem Relation  Age of Onset   Breast cancer Mother    Anuerysm Mother        BRAIN   Cancer Mother        BREAST   COPD Father    Hyperlipidemia Father    Congestive Heart Failure Father    Bipolar disorder Sister    Diabetes Sister    Fibromyalgia Sister    Cancer Paternal Uncle        LUNG   Breast cancer Maternal Grandmother    Cancer Maternal Grandmother 34       BREAST   Cancer Paternal Grandfather        LUNG   Social History   Socioeconomic History   Marital status: Divorced    Spouse name: Not on file   Number of children: Not on file   Years of education: Not on file   Highest education level: Not on file  Occupational History   Not on file  Tobacco Use   Smoking status: Never   Smokeless tobacco: Never  Vaping Use   Vaping Use: Never used  Substance and Sexual Activity   Alcohol use: Yes    Comment: VERY RARE OCCASION   Drug use: Never   Sexual activity: Not on file  Other Topics Concern   Not on file  Social History Narrative   Not on file   Social Determinants of Health   Financial Resource Strain: Not on file  Food Insecurity: Not on file  Transportation Needs: Not on file  Physical Activity: Not on file  Stress: Not on file  Social Connections: Not on file    Review of Systems  Constitutional:  Positive for fatigue. Negative for chills and fever.  HENT:  Negative for congestion, ear pain, rhinorrhea and sore throat.   Eyes:  Negative.   Respiratory:  Positive for shortness of breath (intermittent). Negative for cough.   Cardiovascular:  Negative for chest pain.  Gastrointestinal:  Negative for abdominal pain, constipation, diarrhea, nausea and vomiting.  Endocrine: Negative.   Genitourinary:  Negative for dysuria and urgency.  Musculoskeletal:  Positive for back pain. Negative for myalgias.  Skin:  Positive for color change (bilateral feet "purple").  Allergic/Immunologic: Positive for environmental allergies and immunocompromised state (sarcoidosis).  Neurological:  Positive for dizziness. Negative for weakness, light-headedness and headaches.  Hematological: Negative.   Psychiatric/Behavioral:  Negative for dysphoric mood. The patient is not nervous/anxious.     Objective:  BP 134/82    Pulse 81    Temp (!) 97.3 F (36.3 C)    Ht 5' 10.5" (1.791 m)    Wt (!) 307 lb (139.3 kg)    SpO2 98%    BMI 43.43 kg/m    BP/Weight 02/03/2021 4/81/8563 10/26/9700  Systolic BP 637 - 858  Diastolic BP 68 - 66  Wt. (Lbs) 287 301 301.6  BMI 40.6 42.58 42.66    Physical Exam Vitals reviewed.  Constitutional:      Appearance: She is obese.  HENT:     Head: Normocephalic.     Right Ear: Tympanic membrane normal.     Left Ear: Tympanic membrane normal.     Nose: Nose normal.     Mouth/Throat:     Mouth: Mucous membranes are moist.  Eyes:     Pupils: Pupils are equal, round, and reactive to light.  Cardiovascular:     Rate and Rhythm: Normal rate and regular rhythm.     Pulses: Normal pulses.  Dorsalis pedis pulses are 2+ on the right side and 2+ on the left side.       Posterior tibial pulses are 2+ on the right side and 2+ on the left side.     Heart sounds: Normal heart sounds.  Pulmonary:     Effort: Pulmonary effort is normal.     Breath sounds: Normal breath sounds.  Abdominal:     General: Bowel sounds are normal.     Palpations: Abdomen is soft.  Musculoskeletal:        General: Tenderness  present.  Feet:     Right foot:     Protective Sensation: 8 sites tested.   1 site sensed.    Skin integrity: Callus present.     Toenail Condition: Right toenails are normal.     Left foot:     Protective Sensation: 8 sites tested.  0 sites sensed.     Skin integrity: Callus present.     Toenail Condition: Left toenails are normal.     Comments: Lateral aspect of bilateral feet mottled  Skin:    General: Skin is warm and dry.     Capillary Refill: Capillary refill takes less than 2 seconds.  Neurological:     General: No focal deficit present.     Mental Status: She is alert and oriented to person, place, and time.  Psychiatric:        Mood and Affect: Mood normal.        Behavior: Behavior normal.      Lab Results  Component Value Date   WBC 8.2 02/03/2021   HGB 14.9 02/03/2021   HCT 44.5 02/03/2021   PLT 254 02/03/2021   GLUCOSE 132 (H) 02/03/2021   CHOL 240 (H) 02/03/2021   TRIG 182 (H) 02/03/2021   HDL 43 02/03/2021   LDLCALC 163 (H) 02/03/2021   ALT 42 (H) 02/03/2021   AST 27 02/03/2021   NA 142 02/03/2021   K 4.6 02/03/2021   CL 102 02/03/2021   CREATININE 0.82 02/03/2021   BUN 13 02/03/2021   CO2 23 02/03/2021   TSH 1.810 02/03/2021   INR 1.0 04/16/2019   HGBA1C 6.2 (H) 02/03/2021   MICROALBUR 80 08/15/2020      Assessment & Plan:   1. Type 2 diabetes mellitus with hyperglycemia, without long-term current use of insulin (HCC)-well controlled - CBC with Differential/Platelet - Comprehensive metabolic panel - Hemoglobin A1c - Lipid panel - Microalbumin / creatinine urine ratio - TSH - VITAMIN D 25 Hydroxy (Vit-D Deficiency, Fractures) - Semaglutide,0.25 or 0.5MG/DOS, (OZEMPIC, 0.25 OR 0.5 MG/DOSE,) 2 MG/1.5ML SOPN; Inject 0.25 mg into the skin once a week. Inject 0.25 mg into the skin once a week for 4 weeks, then increase to 0.5 mg injection into skin once a week for 4 weeks, then increase to 1 mg injection once a week for 4 weeks  Dispense: 4 mL;  Refill: 0 - Ambulatory referral to diabetic education  2. Diabetic polyneuropathy associated with type 2 diabetes mellitus (HCC) - CBC with Differential/Platelet - Comprehensive metabolic panel - Hemoglobin A1c - Lipid panel - Microalbumin / creatinine urine ratio - TSH - VITAMIN D 25 Hydroxy (Vit-D Deficiency, Fractures) - Semaglutide,0.25 or 0.5MG/DOS, (OZEMPIC, 0.25 OR 0.5 MG/DOSE,) 2 MG/1.5ML SOPN; Inject 0.25 mg into the skin once a week. Inject 0.25 mg into the skin once a week for 4 weeks, then increase to 0.5 mg injection into skin once a week for 4 weeks, then increase to 1  mg injection once a week for 4 weeks  Dispense: 4 mL; Refill: 0 - Ambulatory referral to diabetic education - gabapentin (NEURONTIN) 300 MG capsule; Take 1 capsule (300 mg total) by mouth 2 (two) times daily.  Dispense: 180 capsule; Refill: 3 -daily foot care and inspection  3. Primary hypertension-well controlled - CBC with Differential/Platelet - Comprehensive metabolic panel - Microalbumin / creatinine urine ratio - TSH - irbesartan (AVAPRO) 75 MG tablet; Take 1 tablet (75 mg total) by mouth daily.  Dispense: 90 tablet; Refill: 0  4. Hypothyroidism (acquired)-well controlled - TSH -continue Levothyroxine 25 mcg daily  5. Mixed hyperlipidemia-not at goal - Lipid panel -continue Crestor 20 mg -heart healthy diet -increase physical activity  6. OSA (obstructive sleep apnea)-not at goal, needs CPAP machine - CBC with Differential/Platelet - Comprehensive metabolic panel - Hemoglobin A1c - Lipid panel  7. Memory loss - B12 and Folate Panel - Methylmalonic Acid, Serum  8. Morbid obesity with body mass index of 40.0-44.9 in adult (HCC) - CBC with Differential/Platelet - Comprehensive metabolic panel - Hemoglobin A1c - Lipid panel - Microalbumin / creatinine urine ratio - TSH - VITAMIN D 25 Hydroxy (Vit-D Deficiency, Fractures)   9. Nausea - ondansetron (ZOFRAN) 4 MG tablet; Take 1 tablet  (4 mg total) by mouth every 8 (eight) hours as needed for nausea or vomiting.  Dispense: 30 tablet; Refill: 1  10. Dizziness - gabapentin (NEURONTIN) 300 MG capsule; Take 1 capsule (300 mg total) by mouth 2 (two) times daily.  Dispense: 180 capsule; Refill: 3 -change positions slowly     Begin Ozempic 0.25 mg injection once weekly for 4 weeks, then  increase to Ozempic 0.5 mg injection once weekly for 4 weeks, then increase to Ozempic 1 mg injection once weekly for 4-weeks Take Zofran as needed for nausea We will call you with lab results, diabetes education, and follow-up on CPAP machine Decrease Gabapentin to 300 mg twice daily Follow-up in 40-month, fasting     Follow-up: 378-month An After Visit Summary was printed and given to the patient.  I, ShRip HarbourNP, have reviewed all documentation for this visit. The documentation on 12/21/21 for the exam, diagnosis, procedures, and orders are all accurate and complete.   Signed, ShRip HarbourNP [phone]

## 2021-12-21 NOTE — Patient Instructions (Addendum)
Begin Ozempic 0.25 mg injection once weekly for 4 weeks, then  increase to Ozempic 0.5 mg injection once weekly for 4 weeks, then increase to Ozempic 1 mg injection once weekly for 4-weeks Take Zofran as needed for nausea We will call you with lab results, diabetes education, and follow-up on CPAP machine Decrease Gabapentin to 300 mg twice daily Follow-up in 62-months, fasting  Diabetes Mellitus and Foot Care Foot care is an important part of your health, especially when you have diabetes. Diabetes may cause you to have problems because of poor blood flow (circulation) to your feet and legs, which can cause your skin to: Become thinner and drier. Break more easily. Heal more slowly. Peel and crack. You may also have nerve damage (neuropathy) in your legs and feet, causing decreased feeling in them. This means that you may not notice minor injuries to your feet that could lead to more serious problems. Noticing and addressing any potential problems early is the best way to prevent future foot problems. How to care for your feet Foot hygiene  Wash your feet daily with warm water and mild soap. Do not use hot water. Then, pat your feet and the areas between your toes until they are completely dry. Do not soak your feet as this can dry your skin. Trim your toenails straight across. Do not dig under them or around the cuticle. File the edges of your nails with an emery board or nail file. Apply a moisturizing lotion or petroleum jelly to the skin on your feet and to dry, brittle toenails. Use lotion that does not contain alcohol and is unscented. Do not apply lotion between your toes. Shoes and socks Wear clean socks or stockings every day. Make sure they are not too tight. Do not wear knee-high stockings since they may decrease blood flow to your legs. Wear shoes that fit properly and have enough cushioning. Always look in your shoes before you put them on to be sure there are no objects inside. To  break in new shoes, wear them for just a few hours a day. This prevents injuries on your feet. Wounds, scrapes, corns, and calluses  Check your feet daily for blisters, cuts, bruises, sores, and redness. If you cannot see the bottom of your feet, use a mirror or ask someone for help. Do not cut corns or calluses or try to remove them with medicine. If you find a minor scrape, cut, or break in the skin on your feet, keep it and the skin around it clean and dry. You may clean these areas with mild soap and water. Do not clean the area with peroxide, alcohol, or iodine. If you have a wound, scrape, corn, or callus on your foot, look at it several times a day to make sure it is healing and not infected. Check for: Redness, swelling, or pain. Fluid or blood. Warmth. Pus or a bad smell. General tips Do not cross your legs. This may decrease blood flow to your feet. Do not use heating pads or hot water bottles on your feet. They may burn your skin. If you have lost feeling in your feet or legs, you may not know this is happening until it is too late. Protect your feet from hot and cold by wearing shoes, such as at the beach or on hot pavement. Schedule a complete foot exam at least once a year (annually) or more often if you have foot problems. Report any cuts, sores, or bruises to your  health care provider immediately. Where to find more information American Diabetes Association: www.diabetes.org Association of Diabetes Care & Education Specialists: www.diabeteseducator.org Contact a health care provider if: You have a medical condition that increases your risk of infection and you have any cuts, sores, or bruises on your feet. You have an injury that is not healing. You have redness on your legs or feet. You feel burning or tingling in your legs or feet. You have pain or cramps in your legs and feet. Your legs or feet are numb. Your feet always feel cold. You have pain around any toenails. Get  help right away if: You have a wound, scrape, corn, or callus on your foot and: You have pain, swelling, or redness that gets worse. You have fluid or blood coming from the wound, scrape, corn, or callus. Your wound, scrape, corn, or callus feels warm to the touch. You have pus or a bad smell coming from the wound, scrape, corn, or callus. You have a fever. You have a red line going up your leg. Summary Check your feet every day for blisters, cuts, bruises, sores, and redness. Apply a moisturizing lotion or petroleum jelly to the skin on your feet and to dry, brittle toenails. Wear shoes that fit properly and have enough cushioning. If you have foot problems, report any cuts, sores, or bruises to your health care provider immediately. Schedule a complete foot exam at least once a year (annually) or more often if you have foot problems. This information is not intended to replace advice given to you by your health care provider. Make sure you discuss any questions you have with your health care provider. Document Revised: 04/29/2020 Document Reviewed: 04/29/2020 Elsevier Patient Education  [place].  Diabetes Mellitus and Nutrition, Adult When you have diabetes, or diabetes mellitus, it is very important to have healthy eating habits because your blood sugar (glucose) levels are greatly affected by what you eat and drink. Eating healthy foods in the right amounts, at about the same times every day, can help you: Manage your blood glucose. Lower your risk of heart disease. Improve your blood pressure. Reach or maintain a healthy weight. What can affect my meal plan? Every person with diabetes is different, and each person has different needs for a meal plan. Your health care provider may recommend that you work with a dietitian to make a meal plan that is best for you. Your meal plan may vary depending on factors such as: The calories you need. The medicines you take. Your  weight. Your blood glucose, blood pressure, and cholesterol levels. Your activity level. Other health conditions you have, such as heart or kidney disease. How do carbohydrates affect me? Carbohydrates, also called carbs, affect your blood glucose level more than any other type of food. Eating carbs raises the amount of glucose in your blood. It is important to know how many carbs you can safely have in each meal. This is different for every person. Your dietitian can help you calculate how many carbs you should have at each meal and for each snack. How does alcohol affect me? Alcohol can cause a decrease in blood glucose (hypoglycemia), especially if you use insulin or take certain diabetes medicines by mouth. Hypoglycemia can be a life-threatening condition. Symptoms of hypoglycemia, such as sleepiness, dizziness, and confusion, are similar to symptoms of having too much alcohol. Do not drink alcohol if: Your health care provider tells you not to drink. You are pregnant, may be  pregnant, or are planning to become pregnant. If you drink alcohol: Limit how much you have to: 0-1 drink a day for women. 0-2 drinks a day for men. Know how much alcohol is in your drink. In the [place] one drink equals one 12 oz bottle of beer (355 mL), one 5 oz glass of wine (148 mL), or one 1 oz glass of hard liquor (44 mL). Keep yourself hydrated with water, diet soda, or unsweetened iced tea. Keep in mind that regular soda, juice, and other mixers may contain a lot of sugar and must be counted as carbs. What are tips for following this plan? Reading food labels Start by checking the serving size on the Nutrition Facts label of packaged foods and drinks. The number of calories and the amount of carbs, fats, and other nutrients listed on the label are based on one serving of the item. Many items contain more than one serving per package. Check the total grams (g) of carbs in one serving. Check the number of grams of  saturated fats and trans fats in one serving. Choose foods that have a low amount or none of these fats. Check the number of milligrams (mg) of salt (sodium) in one serving. Most people should limit total sodium intake to less than 2,300 mg per day. Always check the nutrition information of foods labeled as "low-fat" or "nonfat." These foods may be higher in added sugar or refined carbs and should be avoided. Talk to your dietitian to identify your daily goals for nutrients listed on the label. Shopping Avoid buying canned, pre-made, or processed foods. These foods tend to be high in fat, sodium, and added sugar. Shop around the outside edge of the grocery store. This is where you will most often find fresh fruits and vegetables, bulk grains, fresh meats, and fresh dairy products. Cooking Use low-heat cooking methods, such as baking, instead of high-heat cooking methods, such as deep frying. Cook using healthy oils, such as olive, canola, or sunflower oil. Avoid cooking with butter, cream, or high-fat meats. Meal planning Eat meals and snacks regularly, preferably at the same times every day. Avoid going long periods of time without eating. Eat foods that are high in fiber, such as fresh fruits, vegetables, beans, and whole grains. Eat 4-6 oz (112-168 g) of lean protein each day, such as lean meat, chicken, fish, eggs, or tofu. One ounce (oz) (28 g) of lean protein is equal to: 1 oz (28 g) of meat, chicken, or fish. 1 egg.  cup (62 g) of tofu. Eat some foods each day that contain healthy fats, such as avocado, nuts, seeds, and fish. What foods should I eat? Fruits Berries. Apples. Oranges. Peaches. Apricots. Plums. Grapes. Mangoes. Papayas. Pomegranates. Kiwi. Cherries. Vegetables Leafy greens, including lettuce, spinach, kale, chard, collard greens, mustard greens, and cabbage. Beets. Cauliflower. Broccoli. Carrots. Green beans. Tomatoes. Peppers. Onions. Cucumbers. Brussels  sprouts. Grains Whole grains, such as whole-wheat or whole-grain bread, crackers, tortillas, cereal, and pasta. Unsweetened oatmeal. Quinoa. Brown or wild rice. Meats and other proteins Seafood. Poultry without skin. Lean cuts of poultry and beef. Tofu. Nuts. Seeds. Dairy Low-fat or fat-free dairy products such as milk, yogurt, and cheese. The items listed above may not be a complete list of foods and beverages you can eat and drink. Contact a dietitian for more information. What foods should I avoid? Fruits Fruits canned with syrup. Vegetables Canned vegetables. Frozen vegetables with butter or cream sauce. Grains Refined white flour and flour products  such as bread, pasta, snack foods, and cereals. Avoid all processed foods. Meats and other proteins Fatty cuts of meat. Poultry with skin. Breaded or fried meats. Processed meat. Avoid saturated fats. Dairy Full-fat yogurt, cheese, or milk. Beverages Sweetened drinks, such as soda or iced tea. The items listed above may not be a complete list of foods and beverages you should avoid. Contact a dietitian for more information. Questions to ask a health care provider Do I need to meet with a certified diabetes care and education specialist? Do I need to meet with a dietitian? What number can I call if I have questions? When are the best times to check my blood glucose? Where to find more information: American Diabetes Association: diabetes.org Academy of Nutrition and Dietetics: eatright.[employer] of Diabetes and Digestive and Kidney Diseases: StageSync.si Association of Diabetes Care & Education Specialists: diabeteseducator.org Summary It is important to have healthy eating habits because your blood sugar (glucose) levels are greatly affected by what you eat and drink. It is important to use alcohol carefully. A healthy meal plan will help you manage your blood glucose and lower your risk of heart disease. Your health  care provider may recommend that you work with a dietitian to make a meal plan that is best for you. This information is not intended to replace advice given to you by your health care provider. Make sure you discuss any questions you have with your health care provider. Document Revised: 05/12/2020 Document Reviewed: 05/12/2020 Elsevier Patient Education  2022 Elsevier Inc.    Semaglutide Injection What is this medication? SEMAGLUTIDE (SEM a GLOO tide) treats type 2 diabetes. It works by increasing insulin levels in your body, which decreases your blood sugar (glucose). It also reduces the amount of sugar released into the blood and slows down your digestion. It can also be used to lower the risk of heart attack and stroke in people with type 2 diabetes. Changes to diet and exercise are often combined with this medication. This medicine may be used for other purposes; ask your health care provider or pharmacist if you have questions. COMMON BRAND NAME(S): OZEMPIC What should I tell my care team before I take this medication? They need to know if you have any of these conditions: Endocrine tumors (MEN 2) or if someone in your family had these tumors Eye disease, vision problems History of pancreatitis Kidney disease Stomach problems Thyroid cancer or if someone in your family had thyroid cancer An unusual or allergic reaction to semaglutide, other medications, foods, dyes, or preservatives Pregnant or trying to get pregnant Breast-feeding How should I use this medication? This medication is for injection under the skin of your upper leg (thigh), stomach area, or upper arm. It is given once every week (every 7 days). You will be taught how to prepare and give this medication. Use exactly as directed. Take your medication at regular intervals. Do not take it more often than directed. If you use this medication with insulin, you should inject this medication and the insulin separately. Do not  mix them together. Do not give the injections right next to each other. Change (rotate) injection sites with each injection. It is important that you put your used needles and syringes in a special sharps container. Do not put them in a trash can. If you do not have a sharps container, call your pharmacist or care team to get one. A special MedGuide will be given to you by the pharmacist with each  prescription and refill. Be sure to read this information carefully each time. This medication comes with INSTRUCTIONS FOR USE. Ask your pharmacist for directions on how to use this medication. Read the information carefully. Talk to your pharmacist or care team if you have questions. Talk to your care team about the use of this medication in children. Special care may be needed. Overdosage: If you think you have taken too much of this medicine contact a poison control center or emergency room at once. NOTE: This medicine is only for you. Do not share this medicine with others. What if I miss a dose? If you miss a dose, take it as soon as you can within 5 days after the missed dose. Then take your next dose at your regular weekly time. If it has been longer than 5 days after the missed dose, do not take the missed dose. Take the next dose at your regular time. Do not take double or extra doses. If you have questions about a missed dose, contact your care team for advice. What may interact with this medication? Other medications for diabetes Many medications may cause changes in blood sugar, these include: Alcohol containing beverages Antiviral medications for HIV or AIDS Aspirin and aspirin-like medications Certain medications for blood pressure, heart disease, irregular heart beat Chromium Diuretics Female hormones, such as estrogens or progestins, birth control pills Fenofibrate Gemfibrozil Isoniazid Lanreotide Female hormones or anabolic steroids MAOIs like Carbex, Eldepryl, Marplan, Nardil, and  Parnate Medications for weight loss Medications for allergies, asthma, cold, or cough Medications for depression, anxiety, or psychotic disturbances Niacin Nicotine NSAIDs, medications for pain and inflammation, like ibuprofen or naproxen Octreotide Pasireotide Pentamidine Phenytoin Probenecid Quinolone antibiotics such as ciprofloxacin, levofloxacin, ofloxacin Some herbal dietary supplements Steroid medications such as prednisone or cortisone Sulfamethoxazole; trimethoprim Thyroid hormones Some medications can hide the warning symptoms of low blood sugar (hypoglycemia). You may need to monitor your blood sugar more closely if you are taking one of these medications. These include: Beta-blockers, often used for high blood pressure or heart problems (examples include atenolol, metoprolol, propranolol) Clonidine Guanethidine Reserpine This list may not describe all possible interactions. Give your health care provider a list of all the medicines, herbs, non-prescription drugs, or dietary supplements you use. Also tell them if you smoke, drink alcohol, or use illegal drugs. Some items may interact with your medicine. What should I watch for while using this medication? Visit your care team for regular checks on your progress. Drink plenty of fluids while taking this medication. Check with your care team if you get an attack of severe diarrhea, nausea, and vomiting. The loss of too much body fluid can make it dangerous for you to take this medication. A test called the HbA1C (A1C) will be monitored. This is a simple blood test. It measures your blood sugar control over the last 2 to 3 months. You will receive this test every 3 to 6 months. Learn how to check your blood sugar. Learn the symptoms of low and high blood sugar and how to manage them. Always carry a quick-source of sugar with you in case you have symptoms of low blood sugar. Examples include hard sugar candy or glucose tablets.  Make sure others know that you can choke if you eat or drink when you develop serious symptoms of low blood sugar, such as seizures or unconsciousness. They must get medical help at once. Tell your care team if you have high blood sugar. You might need to  change the dose of your medication. If you are sick or exercising more than usual, you might need to change the dose of your medication. Do not skip meals. Ask your care team if you should avoid alcohol. Many nonprescription cough and cold products contain sugar or alcohol. These can affect blood sugar. Pens should never be shared. Even if the needle is changed, sharing may result in passing of viruses like hepatitis or HIV. Wear a medical ID bracelet or chain, and carry a card that describes your disease and details of your medication and dosage times. Do not become pregnant while taking this medication. Women should inform their care team if they wish to become pregnant or think they might be pregnant. There is a potential for serious side effects to an unborn child. Talk to your care team for more information. What side effects may I notice from receiving this medication? Side effects that you should report to your care team as soon as possible: Allergic reactions--skin rash, itching, hives, swelling of the face, lips, tongue, or throat Change in vision Dehydration--increased thirst, dry mouth, feeling faint or lightheaded, headache, dark yellow or brown urine Gallbladder problems--severe stomach pain, nausea, vomiting, fever Heart palpitations--rapid, pounding, or irregular heartbeat Kidney injury--decrease in the amount of urine, swelling of the ankles, hands, or feet Pancreatitis--severe stomach pain that spreads to your back or gets worse after eating or when touched, fever, nausea, vomiting Thyroid cancer--new mass or lump in the neck, pain or trouble swallowing, trouble breathing, hoarseness Side effects that usually do not require medical  attention (report to your care team if they continue or are bothersome): Diarrhea Loss of appetite Nausea Stomach pain Vomiting This list may not describe all possible side effects. Call your doctor for medical advice about side effects. You may report side effects to FDA at 1-800-FDA-1088. Where should I keep my medication? Keep out of the reach of children. Store unopened pens in a refrigerator between 2 and 8 degrees C (36 and 46 degrees F). Do not freeze. Protect from light and heat. After you first use the pen, it can be stored for 56 days at room temperature between 15 and 30 degrees C (59 and 86 degrees F) or in a refrigerator. Throw away your used pen after 56 days or after the expiration date, whichever comes first. Do not store your pen with the needle attached. If the needle is left on, medication may leak from the pen. NOTE: This sheet is a summary. It may not cover all possible information. If you have questions about this medicine, talk to your doctor, pharmacist, or health care provider.  2022 Elsevier/[employer] Standard (2021-01-13 00:00:00)

## 2021-12-25 LAB — CBC WITH DIFFERENTIAL/PLATELET
Basophils Absolute: 0.1 10*3/uL (ref 0.0–0.2)
Basos: 1 %
EOS (ABSOLUTE): 0.1 10*3/uL (ref 0.0–0.4)
Eos: 2 %
Hematocrit: 44.4 % (ref 34.0–46.6)
Hemoglobin: 15.3 g/dL (ref 11.1–15.9)
Immature Grans (Abs): 0 10*3/uL (ref 0.0–0.1)
Immature Granulocytes: 0 %
Lymphocytes Absolute: 2.4 10*3/uL (ref 0.7–3.1)
Lymphs: 28 %
MCH: 30.2 pg (ref 26.6–33.0)
MCHC: 34.5 g/dL (ref 31.5–35.7)
MCV: 88 fL (ref 79–97)
Monocytes Absolute: 0.5 10*3/uL (ref 0.1–0.9)
Monocytes: 5 %
Neutrophils Absolute: 5.6 10*3/uL (ref 1.4–7.0)
Neutrophils: 64 %
Platelets: 252 10*3/uL (ref 150–450)
RBC: 5.06 x10E6/uL (ref 3.77–5.28)
RDW: 11.8 % (ref 11.7–15.4)
WBC: 8.6 10*3/uL (ref 3.4–10.8)

## 2021-12-25 LAB — COMPREHENSIVE METABOLIC PANEL
ALT: 39 IU/L — ABNORMAL HIGH (ref 0–32)
AST: 33 IU/L (ref 0–40)
Albumin/Globulin Ratio: 1.6 (ref 1.2–2.2)
Albumin: 4.6 g/dL (ref 3.8–4.9)
Alkaline Phosphatase: 117 IU/L (ref 44–121)
BUN/Creatinine Ratio: 14 (ref 9–23)
BUN: 11 mg/dL (ref 6–24)
Bilirubin Total: 0.4 mg/dL (ref 0.0–1.2)
CO2: 24 mmol/L (ref 20–29)
Calcium: 9.8 mg/dL (ref 8.7–10.2)
Chloride: 101 mmol/L (ref 96–106)
Creatinine, Ser: 0.77 mg/dL (ref 0.57–1.00)
Globulin, Total: 2.8 g/dL (ref 1.5–4.5)
Glucose: 137 mg/dL — ABNORMAL HIGH (ref 70–99)
Potassium: 4.6 mmol/L (ref 3.5–5.2)
Sodium: 142 mmol/L (ref 134–144)
Total Protein: 7.4 g/dL (ref 6.0–8.5)
eGFR: 91 mL/min/{1.73_m2} (ref >59)

## 2021-12-25 LAB — LIPID PANEL
Chol/HDL Ratio: 3.2 ratio (ref 0.0–4.4)
Cholesterol, Total: 156 mg/dL (ref 100–199)
HDL: 49 mg/dL (ref >39)
LDL Chol Calc (NIH): 75 mg/dL (ref 0–99)
Triglycerides: 191 mg/dL — ABNORMAL HIGH (ref 0–149)
VLDL Cholesterol Cal: 32 mg/dL (ref 5–40)

## 2021-12-25 LAB — MICROALBUMIN / CREATININE URINE RATIO
Creatinine, Urine: 100.1 mg/dL
Microalb/Creat Ratio: <3 mg/g creat (ref 0–29)
Microalbumin, Urine: <3 ug/mL

## 2021-12-25 LAB — B12 AND FOLATE PANEL
Folate: >20 ng/mL (ref >3.0)
Vitamin B-12: 916 pg/mL (ref 232–1245)

## 2021-12-25 LAB — CARDIOVASCULAR RISK ASSESSMENT

## 2021-12-25 LAB — HEMOGLOBIN A1C
Est. average glucose Bld gHb Est-mCnc: 171 mg/dL
Hgb A1c MFr Bld: 7.6 % — ABNORMAL HIGH (ref 4.8–5.6)

## 2021-12-25 LAB — METHYLMALONIC ACID, SERUM: Methylmalonic Acid: 122 nmol/L (ref 0–378)

## 2021-12-25 LAB — TSH: TSH: 1.68 u[IU]/mL (ref 0.450–4.500)

## 2021-12-25 LAB — VITAMIN D 25 HYDROXY (VIT D DEFICIENCY, FRACTURES): Vit D, 25-Hydroxy: 27.8 ng/mL — ABNORMAL LOW (ref 30.0–100.0)

## 2021-12-26 ENCOUNTER — Other Ambulatory Visit: Payer: Self-pay

## 2021-12-26 MED ORDER — VITAMIN D (ERGOCALCIFEROL) 1.25 MG (50000 UNIT) PO CAPS: 50000.0000 [IU] | ORAL_CAPSULE | ORAL | 0 refills | Status: DC

## 2021-12-30 ENCOUNTER — Other Ambulatory Visit: Payer: Self-pay | Admitting: Nurse Practitioner

## 2021-12-30 ENCOUNTER — Encounter: Payer: Self-pay | Admitting: Family Medicine

## 2021-12-30 DIAGNOSIS — J0181 Other acute recurrent sinusitis: Secondary | ICD-10-CM

## 2022-01-06 ENCOUNTER — Other Ambulatory Visit: Payer: Self-pay | Admitting: Family Medicine

## 2022-01-06 ENCOUNTER — Encounter: Payer: Self-pay | Admitting: Family Medicine

## 2022-01-06 NOTE — Telephone Encounter (Signed)
Refill sent to pharmacy.   

## 2022-01-07 ENCOUNTER — Encounter: Payer: Self-pay | Admitting: Nurse Practitioner

## 2022-01-25 ENCOUNTER — Other Ambulatory Visit: Payer: Self-pay

## 2022-01-25 DIAGNOSIS — E1142 Type 2 diabetes mellitus with diabetic polyneuropathy: Secondary | ICD-10-CM

## 2022-01-25 DIAGNOSIS — E1165 Type 2 diabetes mellitus with hyperglycemia: Secondary | ICD-10-CM

## 2022-01-25 MED ORDER — OZEMPIC (0.25 OR 0.5 MG/DOSE) 2 MG/1.5ML ~~LOC~~ SOPN: 0.5000 mg | PEN_INJECTOR | SUBCUTANEOUS | 0 refills | Status: DC

## 2022-01-25 NOTE — Addendum Note (Signed)
Addended by: Rip Harbour on: 01/25/2022 08:34 PM ? ? Modules accepted: Orders ? ?

## 2022-01-25 NOTE — Telephone Encounter (Signed)
[facility] reached out and stated Ozempic is now 3 mL instead 1.5 and that need a new script in order to fill and order for her.  ?

## 2022-02-05 ENCOUNTER — Encounter: Payer: Self-pay | Admitting: Nurse Practitioner

## 2022-02-05 ENCOUNTER — Other Ambulatory Visit: Payer: Self-pay | Admitting: Nurse Practitioner

## 2022-02-05 DIAGNOSIS — E1165 Type 2 diabetes mellitus with hyperglycemia: Secondary | ICD-10-CM

## 2022-02-05 DIAGNOSIS — E782 Mixed hyperlipidemia: Secondary | ICD-10-CM

## 2022-02-05 DIAGNOSIS — E1142 Type 2 diabetes mellitus with diabetic polyneuropathy: Secondary | ICD-10-CM

## 2022-02-06 ENCOUNTER — Encounter: Payer: Self-pay | Admitting: Nurse Practitioner

## 2022-02-06 DIAGNOSIS — E1165 Type 2 diabetes mellitus with hyperglycemia: Secondary | ICD-10-CM

## 2022-02-06 DIAGNOSIS — R110 Nausea: Secondary | ICD-10-CM

## 2022-02-14 ENCOUNTER — Encounter: Payer: Self-pay | Admitting: Family Medicine

## 2022-02-14 ENCOUNTER — Ambulatory Visit: Payer: BC Managed Care – PPO | Admitting: Family Medicine

## 2022-02-14 ENCOUNTER — Telehealth: Payer: Self-pay

## 2022-02-14 VITALS — BP 158/68 | HR 78 | Temp 97.1°F | Ht 70.5 in | Wt 306.0 lb

## 2022-02-14 DIAGNOSIS — J189 Pneumonia, unspecified organism: Secondary | ICD-10-CM | POA: Diagnosis not present

## 2022-02-14 DIAGNOSIS — R051 Acute cough: Secondary | ICD-10-CM

## 2022-02-14 DIAGNOSIS — J208 Acute bronchitis due to other specified organisms: Secondary | ICD-10-CM

## 2022-02-14 DIAGNOSIS — J0101 Acute recurrent maxillary sinusitis: Secondary | ICD-10-CM

## 2022-02-14 LAB — POC COVID19 BINAXNOW: SARS Coronavirus 2 Ag: NEGATIVE

## 2022-02-14 LAB — POCT INFLUENZA A/B
Influenza A, POC: NEGATIVE
Influenza B, POC: NEGATIVE

## 2022-02-14 MED ORDER — AMOXICILLIN-POT CLAVULANATE 875-125 MG PO TABS: 1.0000 | ORAL_TABLET | Freq: Two times a day (BID) | ORAL | 0 refills | Status: DC

## 2022-02-14 MED ORDER — ALBUTEROL SULFATE HFA 108 (90 BASE) MCG/ACT IN AERS: 2.0000 | INHALATION_SPRAY | Freq: Four times a day (QID) | RESPIRATORY_TRACT | 2 refills | Status: DC | PRN

## 2022-02-14 MED ORDER — CEFTRIAXONE SODIUM 1 G IJ SOLR
1.0000 g | Freq: Once | INTRAMUSCULAR | Status: AC
  Administered 2022-02-14: 1 g via INTRAMUSCULAR

## 2022-02-14 MED ORDER — PREDNISONE 50 MG PO TABS: 50.0000 mg | ORAL_TABLET | Freq: Every day | ORAL | 0 refills | Status: DC

## 2022-02-14 MED ORDER — TRIAMCINOLONE ACETONIDE 40 MG/ML IJ SUSP
80.0000 mg | Freq: Once | INTRAMUSCULAR | Status: AC
  Administered 2022-02-14: 80 mg via INTRAMUSCULAR

## 2022-02-14 NOTE — Progress Notes (Signed)
? ?Acute Office Visit ? ?Subjective:  ? ? Patient ID: Holly [name], female    DOB: 12-31-1965, 56 y.o.   MRN: [id number] ? ?Chief Complaint  ?Patient presents with  ? Cough  ?  Congestion/Headache/Head and chest congestion/Bilateral ears  ? ? ?HPI: ?Patient is in today for cough, congestion, runny nose x 2 days. Patient was outside all day Saturday. Tried sudafed. Taking 4 advil in evening. Complaining of Achy, fever, chills, sweats, hoarse, sore throat. ? ?Past Medical History:  ?Diagnosis Date  ? Anxiety   ? COVID-19 virus detected 12/30/2019  ? 10/30/2019-SARS-CoV-2-positive Didn't require hospitalization    ? Depression   ? Diabetes mellitus without complication ([place])   ? Fatty liver   ? Goiter   ? Hashimoto's thyroiditis   ? Hilar adenopathy 03/20/19  04/11/19  ? CTA CHEST and PET per DR. Lauretta Chester LEWIS  ? History of kidney stones   ? Hypertension   ? Mediastinal adenopathy   ? PER CTA CHEST PET SCAN per DR. Beryle Lathe LEWIS  ? Pneumonia   ? Supraclavicular adenopathy 03/20/2019  ? PER CTA CHEST and PET per DR. Renelda Mom LEWIS  ? ? ?Past Surgical History:  ?Procedure Laterality Date  ? ABDOMINAL HYSTERECTOMY    ? COMPLETE  ? ADHESIOLYSIS    ? APPENDECTOMY    ? CESAREAN SECTION    ? MEDIASTINOSCOPY N/A 04/18/2019  ? Procedure: MEDIASTINOSCOPY;  Surgeon: Grace Isaac, MD;  Location: [facility] OR;  Service: Thoracic;  Laterality: N/A;  ? VIDEO BRONCHOSCOPY WITH ENDOBRONCHIAL ULTRASOUND N/A 04/18/2019  ? Procedure: VIDEO BRONCHOSCOPY WITH ENDOBRONCHIAL ULTRASOUND;  Surgeon: Grace Isaac, MD;  Location: [facility];  Service: Thoracic;  Laterality: N/A;  ? ? ?Family History  ?Problem Relation Age of Onset  ? Breast cancer Mother   ? Anuerysm Mother   ?     BRAIN  ? Cancer Mother   ?     BREAST  ? COPD Father   ? Hyperlipidemia Father   ? Congestive Heart Failure Father   ? Bipolar disorder Sister   ? Diabetes Sister   ? Fibromyalgia Sister   ? Cancer Paternal Uncle   ?     LUNG  ? Breast cancer Maternal Grandmother   ? Cancer  Maternal Grandmother 66  ?     BREAST  ? Cancer Paternal Grandfather   ?     LUNG  ? ? ?Social History  ? ?Socioeconomic History  ? Marital status: Divorced  ?  Spouse name: Not on file  ? Number of children: Not on file  ? Years of education: Not on file  ? Highest education level: Not on file  ?Occupational History  ? Not on file  ?Tobacco Use  ? Smoking status: Never  ? Smokeless tobacco: Never  ?Vaping Use  ? Vaping Use: Never used  ?Substance and Sexual Activity  ? Alcohol use: Yes  ?  Comment: VERY RARE OCCASION  ? Drug use: Never  ? Sexual activity: Not on file  ?Other Topics Concern  ? Not on file  ?Social History Narrative  ? Not on file  ? ?Social Determinants of Health  ? ?Financial Resource Strain: Not on file  ?Food Insecurity: Not on file  ?Transportation Needs: Not on file  ?Physical Activity: Not on file  ?Stress: Not on file  ?Social Connections: Not on file  ?Intimate Partner Violence: Not on file  ? ? ?Outpatient Medications Prior to Visit  ?Medication Sig Dispense Refill  ? budesonide-formoterol (  SYMBICORT) 160-4.5 MCG/ACT inhaler Inhale 2 puffs into the lungs 2 (two) times daily. 1 each 3  ? buPROPion (WELLBUTRIN XL) 300 MG 24 hr tablet TAKE 1 TABLET BY MOUTH EVERY DAY 90 tablet 0  ? famotidine (PEPCID) 40 MG tablet TAKE 1 TABLET BY MOUTH TWICE A DAY 180 tablet 1  ? fluticasone (FLONASE) 50 MCG/ACT nasal spray Place 2 sprays into both nostrils daily. 16 g 6  ? gabapentin (NEURONTIN) 300 MG capsule Take 1 capsule (300 mg total) by mouth 2 (two) times daily. 180 capsule 3  ? irbesartan (AVAPRO) 75 MG tablet Take 1 tablet (75 mg total) by mouth daily. 90 tablet 0  ? levothyroxine (SYNTHROID) 25 MCG tablet TAKE 1 TABLET BY MOUTH EVERY DAY BEFORE BREAKFAST 90 tablet 0  ? loratadine (CLARITIN) 10 MG tablet TAKE 1 TABLET BY MOUTH EVERY DAY 90 tablet 0  ? metFORMIN (GLUCOPHAGE) 1000 MG tablet TAKE 1 TABLET (1,000 MG TOTAL) BY MOUTH 2 (TWO) TIMES DAILY WITH A MEAL. PLEASE CALL FOR A FASTING FOLLOW UP  APPOINTMENT. THANK YOU, DR. Aleaha Fickling 180 tablet 2  ? ondansetron (ZOFRAN) 4 MG tablet Take 1 tablet (4 mg total) by mouth every 8 (eight) hours as needed for nausea or vomiting. 30 tablet 1  ? rosuvastatin (CRESTOR) 20 MG tablet TAKE 1 TABLET BY MOUTH EVERY DAY 90 tablet 1  ? SODIUM FLUORIDE 5000 PPM 1.1 % PSTE See admin instructions.    ? traZODone (DESYREL) 150 MG tablet TAKE 1 TABLET BY MOUTH EVERYDAY AT BEDTIME 90 tablet 0  ? venlafaxine XR (EFFEXOR-XR) 75 MG 24 hr capsule TAKE 3 CAPSULES BY MOUTH DAILY IN THE MORNING 270 capsule 1  ? Vitamin D, Ergocalciferol, (DRISDOL) 1.25 MG (50000 UNIT) CAPS capsule Take 1 capsule (50,000 Units total) by mouth every 7 (seven) days. 12 capsule 0  ? albuterol (VENTOLIN HFA) 108 (90 Base) MCG/ACT inhaler Inhale 2 puffs into the lungs every 6 (six) hours as needed for wheezing or shortness of breath. 18 g 2  ? Semaglutide,0.25 or 0.5MG/DOS, (OZEMPIC, 0.25 OR 0.5 MG/DOSE,) 2 MG/1.5ML SOPN Inject 0.5 mg into the skin once a week. 3 mL 0  ? ?No facility-administered medications prior to visit.  ? ? ?Allergies  ?Allergen Reactions  ? Avelox [Moxifloxacin Hcl In Nacl]   ? Cymbalta [Duloxetine Hcl]   ? Oxycodone-Acetaminophen   ? ? ?Review of Systems  ?All other systems reviewed and are negative. ? ?   ?Objective:  ?  ?Physical Exam ?Vitals reviewed.  ?Constitutional:   ?   Appearance: Normal appearance. She is obese.  ?HENT:  ?   Right Ear: Tympanic membrane, ear canal and external ear normal.  ?   Left Ear: Tympanic membrane, ear canal and external ear normal.  ?   Nose: Congestion present.  ?   Comments: Erythema, edema ?   Mouth/Throat:  ?   Pharynx: Posterior oropharyngeal erythema (mild) present. No oropharyngeal exudate.  ?Cardiovascular:  ?   Rate and Rhythm: Normal rate and regular rhythm.  ?   Heart sounds: Normal heart sounds. No murmur heard. ?Pulmonary:  ?   Effort: Pulmonary effort is normal. No respiratory distress.  ?   Breath sounds: Wheezing (expiratory) and rhonchi  present.  ?Neurological:  ?   Mental Status: She is alert and oriented to person, place, and time.  ?Psychiatric:     ?   Mood and Affect: Mood normal.     ?   Behavior: Behavior normal.  ? ? ?BP Marland Kitchen)  158/68 (BP Location: Left Arm, Patient Position: Sitting)   Pulse 78   Temp (!) 97.1 ?F (36.2 ?C) (Oral)   Ht 5' 10.5" (1.791 m)   Wt (!) 306 lb (138.8 kg)   SpO2 94%   BMI 43.29 kg/m?  ?Wt Readings from Last 3 Encounters:  ?02/14/22 (!) 306 lb (138.8 kg)  ?12/21/21 (!) 307 lb (139.3 kg)  ?02/03/21 287 lb (130.2 kg)  ? ? ?Health Maintenance Due  ?Topic Date Due  ? OPHTHALMOLOGY EXAM  Never done  ? HIV Screening  Never done  ? Hepatitis C Screening  Never done  ? PAP SMEAR-Modifier  Never done  ? COVID-19 Vaccine (3 - Moderna risk series) 02/16/2020  ? Zoster Vaccines- Shingrix (2 of 2) 08/05/2021  ? ? ?There are no preventive care reminders to display for this patient. ? ? ?Lab Results  ?Component Value Date  ? TSH 1.680 12/21/2021  ? ?Lab Results  ?Component Value Date  ? WBC 8.6 12/21/2021  ? HGB 15.3 12/21/2021  ? HCT 44.4 12/21/2021  ? MCV 88 12/21/2021  ? PLT 252 12/21/2021  ? ?Lab Results  ?Component Value Date  ? NA 142 12/21/2021  ? K 4.6 12/21/2021  ? CO2 24 12/21/2021  ? GLUCOSE 137 (H) 12/21/2021  ? BUN 11 12/21/2021  ? CREATININE 0.77 12/21/2021  ? BILITOT 0.4 12/21/2021  ? ALKPHOS 117 12/21/2021  ? AST 33 12/21/2021  ? ALT 39 (H) 12/21/2021  ? PROT 7.4 12/21/2021  ? ALBUMIN 4.6 12/21/2021  ? CALCIUM 9.8 12/21/2021  ? ANIONGAP 11 04/16/2019  ? EGFR 91 12/21/2021  ? GFR 71.87 12/31/2019  ? ?Lab Results  ?Component Value Date  ? CHOL 156 12/21/2021  ? ?Lab Results  ?Component Value Date  ? HDL 49 12/21/2021  ? ?Lab Results  ?Component Value Date  ? [place] 75 12/21/2021  ? ?Lab Results  ?Component Value Date  ? TRIG 191 (H) 12/21/2021  ? ?Lab Results  ?Component Value Date  ? CHOLHDL 3.2 12/21/2021  ? ?Lab Results  ?Component Value Date  ? HGBA1C 7.6 (H) 12/21/2021  ? ? ?   ?Assessment & Plan:   ? ?Problem List Items Addressed This Visit   ? ?  ? Respiratory  ? Acute sinusitis  ? Relevant Medications  ? amoxicillin-clavulanate (AUGMENTIN) 875-125 MG tablet  ? predniSONE (DELTASONE) 50 MG tablet  ?  ? Other  ? Cough -

## 2022-02-14 NOTE — Patient Instructions (Signed)
Given rocephin 1 mg and kenalog 80 mg shots.  ?Start on augmentin (antibiotic) ?Start on prednisone 50 mg once daily x 5 days.  ?Recommend albuterol inhaler 2 puffs four times a day as needed shortness of breath.  ?Tylenol for fever.  ?Recommend get Chest xray  ? ?

## 2022-02-14 NOTE — Telephone Encounter (Signed)
Holly [name] called requesting a work note.  He forgot to ask while he was here.  He is uncertain how long you think he should be out.  He can be reached at [phone]. ?

## 2022-02-15 NOTE — Telephone Encounter (Signed)
Spoke with patient. She was ok with note. Requested note be mailed to place of work.  ? ?Address: [address] [place], [place] [address]  ? ?Terrill Mohr 02/15/22 9:31 AM ? ?

## 2022-02-16 ENCOUNTER — Other Ambulatory Visit: Payer: Self-pay

## 2022-02-16 MED ORDER — SEMAGLUTIDE (1 MG/DOSE) 4 MG/3ML ~~LOC~~ SOPN: 1.0000 mg | PEN_INJECTOR | SUBCUTANEOUS | 0 refills | Status: DC

## 2022-02-16 MED ORDER — AZITHROMYCIN 250 MG PO TABS: ORAL_TABLET | ORAL | 0 refills | Status: DC

## 2022-02-16 MED ORDER — ONDANSETRON HCL 4 MG PO TABS: 4.0000 mg | ORAL_TABLET | Freq: Three times a day (TID) | ORAL | 0 refills | Status: DC | PRN

## 2022-02-17 ENCOUNTER — Ambulatory Visit: Payer: BC Managed Care – PPO | Admitting: Family Medicine

## 2022-02-19 DIAGNOSIS — J208 Acute bronchitis due to other specified organisms: Secondary | ICD-10-CM | POA: Insufficient documentation

## 2022-02-19 DIAGNOSIS — J189 Pneumonia, unspecified organism: Secondary | ICD-10-CM | POA: Insufficient documentation

## 2022-02-20 ENCOUNTER — Ambulatory Visit: Payer: BC Managed Care – PPO | Admitting: Family Medicine

## 2022-02-20 ENCOUNTER — Encounter: Payer: Self-pay | Admitting: Family Medicine

## 2022-02-20 VITALS — BP 146/80 | HR 85 | Temp 99.0°F | Resp 16 | Ht 70.0 in | Wt 306.0 lb

## 2022-02-20 DIAGNOSIS — J189 Pneumonia, unspecified organism: Secondary | ICD-10-CM

## 2022-02-20 DIAGNOSIS — D860 Sarcoidosis of lung: Secondary | ICD-10-CM | POA: Diagnosis not present

## 2022-02-20 MED ORDER — DOXYCYCLINE HYCLATE 100 MG PO TABS: 100.0000 mg | ORAL_TABLET | Freq: Two times a day (BID) | ORAL | 0 refills | Status: DC

## 2022-02-20 MED ORDER — PREDNISONE 20 MG PO TABS: ORAL_TABLET | ORAL | 0 refills | Status: AC

## 2022-02-20 MED ORDER — CEFTRIAXONE SODIUM 1 G IJ SOLR
1.0000 g | Freq: Once | INTRAMUSCULAR | Status: AC
  Administered 2022-02-20: 1 g via INTRAMUSCULAR

## 2022-02-20 MED ORDER — TRIAMCINOLONE ACETONIDE 40 MG/ML IJ SUSP
80.0000 mg | Freq: Once | INTRAMUSCULAR | Status: AC
  Administered 2022-02-20: 80 mg via INTRAMUSCULAR

## 2022-02-20 NOTE — Patient Instructions (Signed)
Complete Augmentin.  Start doxycycline now. ?Prednisone 20 mg 3 daily for 3 days, 2 daily for 3 days, 1 daily for 3 days. ?Use albuterol 4 times daily for the next 48 hours and then 4 times daily as needed wheezing, shortness of breath. ?Ordering CT scan of chest for tomorrow.  You need should receive a call from our office tomorrow morning.  ?Refer to Dr. Talmadge Coventry in  [facility].  ? ?

## 2022-02-20 NOTE — Progress Notes (Signed)
? ?Acute Office Visit ? ?Subjective:  ? ? Patient ID: Holly [name], female    DOB: 1966/05/11, 56 y.o.   MRN: [id number] ? ?Chief Complaint  ?Patient presents with  ? Pneumonia  ? ? ?HPI: ?Patient is in today for follow up for pneumonia. Patient mentioned that she started with cough and sinus pressure one week and half. She was seen at the office 02/14/2022. She started prednisone 50 mg per 5 days, augmentin per 10 days and Zpack. She finished Zpack and prednisone, but she still feels that pneumonia is not getting better. Sinus infection symptoms have improved. Also gave kenalog and rocephin on 02/14/2022. ?Patient is on symbicort 2 puffs twice daily and albuterol 1-2 times per day.  ? ?Past Medical History:  ?Diagnosis Date  ? Anxiety   ? COVID-19 virus detected 12/30/2019  ? 10/30/2019-SARS-CoV-2-positive Didn't require hospitalization    ? Depression   ? Diabetes mellitus without complication ([facility])   ? Fatty liver   ? Goiter   ? Hashimoto's thyroiditis   ? Hilar adenopathy 03/20/19  04/11/19  ? CTA CHEST and PET per DR. Anne NgEQUINCY LEWIS  ? History of kidney stones   ? Hypertension   ? Mediastinal adenopathy   ? PER CTA CHEST PET SCAN per DR. Lanae CrumblyeQINCY LEWIS  ? Pneumonia   ? Supraclavicular adenopathy 03/20/2019  ? PER CTA CHEST and PET per DR. Sudie BaileyEQUNCY LEWIS  ? ? ?Past Surgical History:  ?Procedure Laterality Date  ? ABDOMINAL HYSTERECTOMY    ? COMPLETE  ? ADHESIOLYSIS    ? APPENDECTOMY    ? CESAREAN SECTION    ? MEDIASTINOSCOPY N/A 04/18/2019  ? Procedure: MEDIASTINOSCOPY;  Surgeon: Delight OvensGerhardt, Edward B, MD;  [facility];  Service: Thoracic;  Laterality: N/A;  ? VIDEO BRONCHOSCOPY WITH ENDOBRONCHIAL ULTRASOUND N/A 04/18/2019  ? Procedure: VIDEO BRONCHOSCOPY WITH ENDOBRONCHIAL ULTRASOUND;  Surgeon: Delight OvensGerhardt, Edward B, MD;  [facility];  Service: Thoracic;  Laterality: N/A;  ? ? ?Family History  ?Problem Relation Age of Onset  ? Breast cancer Mother   ? Anuerysm Mother   ?     BRAIN  ? Cancer Mother   ?     BREAST  ? COPD  Father   ? Hyperlipidemia Father   ? Congestive Heart Failure Father   ? Bipolar disorder Sister   ? Diabetes Sister   ? Fibromyalgia Sister   ? Cancer Paternal Uncle   ?     LUNG  ? Breast cancer Maternal Grandmother   ? Cancer Maternal Grandmother [id number]  ?     BREAST  ? Cancer Paternal Grandfather   ?     LUNG  ? ? ?Social History  ? ?Socioeconomic History  ? Marital status: Divorced  ?  Spouse name: Not on file  ? Number of children: Not on file  ? Years of education: Not on file  ? Highest education level: Not on file  ?Occupational History  ? Not on file  ?Tobacco Use  ? Smoking status: Never  ? Smokeless tobacco: Never  ?Vaping Use  ? Vaping Use: Never used  ?Substance and Sexual Activity  ? Alcohol use: Yes  ?  Comment: VERY RARE OCCASION  ? Drug use: Never  ? Sexual activity: Not on file  ?Other Topics Concern  ? Not on file  ?Social History Narrative  ? Not on file  ? ?Social Determinants of Health  ? ?Financial Resource Strain: Not on file  ?Food Insecurity: Not on file  ?Transportation  Needs: Not on file  ?Physical Activity: Not on file  ?Stress: Not on file  ?Social Connections: Not on file  ?Intimate Partner Violence: Not on file  ? ? ?Outpatient Medications Prior to Visit  ?Medication Sig Dispense Refill  ? albuterol (VENTOLIN HFA) 108 (90 Base) MCG/ACT inhaler Inhale 2 puffs into the lungs every 6 (six) hours as needed for wheezing or shortness of breath. 18 g 2  ? amoxicillin-clavulanate (AUGMENTIN) 875-125 MG tablet Take 1 tablet by mouth 2 (two) times daily. 20 tablet 0  ? budesonide-formoterol (SYMBICORT) 160-4.5 MCG/ACT inhaler Inhale 2 puffs into the lungs 2 (two) times daily. 1 each 3  ? buPROPion (WELLBUTRIN XL) 300 MG 24 hr tablet TAKE 1 TABLET BY MOUTH EVERY DAY 90 tablet 0  ? famotidine (PEPCID) 40 MG tablet TAKE 1 TABLET BY MOUTH TWICE A DAY 180 tablet 1  ? fluticasone (FLONASE) 50 MCG/ACT nasal spray Place 2 sprays into both nostrils daily. 16 g 6  ? gabapentin (NEURONTIN) 300 MG capsule  Take 1 capsule (300 mg total) by mouth 2 (two) times daily. 180 capsule 3  ? irbesartan (AVAPRO) 75 MG tablet Take 1 tablet (75 mg total) by mouth daily. 90 tablet 0  ? loratadine (CLARITIN) 10 MG tablet TAKE 1 TABLET BY MOUTH EVERY DAY 90 tablet 0  ? metFORMIN (GLUCOPHAGE) 1000 MG tablet TAKE 1 TABLET (1,000 MG TOTAL) BY MOUTH 2 (TWO) TIMES DAILY WITH A MEAL. PLEASE CALL FOR A FASTING FOLLOW UP APPOINTMENT. THANK YOU, DR. Shereen Marton 180 tablet 2  ? ondansetron (ZOFRAN) 4 MG tablet Take 1 tablet (4 mg total) by mouth every 8 (eight) hours as needed for nausea or vomiting. 20 tablet 0  ? rosuvastatin (CRESTOR) 20 MG tablet TAKE 1 TABLET BY MOUTH EVERY DAY 90 tablet 1  ? Semaglutide, 1 MG/DOSE, 4 MG/3ML SOPN Inject 1 mg as directed once a week. 3 mL 0  ? SODIUM FLUORIDE 5000 PPM 1.1 % PSTE See admin instructions.    ? traZODone (DESYREL) 150 MG tablet TAKE 1 TABLET BY MOUTH EVERYDAY AT BEDTIME 90 tablet 0  ? venlafaxine XR (EFFEXOR-XR) 75 MG 24 hr capsule TAKE 3 CAPSULES BY MOUTH DAILY IN THE MORNING 270 capsule 1  ? Vitamin D, Ergocalciferol, (DRISDOL) 1.25 MG (50000 UNIT) CAPS capsule Take 1 capsule (50,000 Units total) by mouth every 7 (seven) days. 12 capsule 0  ? azithromycin (ZITHROMAX) 250 MG tablet Take 2 tablets on day 1, then 1 tablet daily on days 2 through 5 6 tablet 0  ? levothyroxine (SYNTHROID) 25 MCG tablet TAKE 1 TABLET BY MOUTH EVERY DAY BEFORE BREAKFAST 90 tablet 0  ? ondansetron (ZOFRAN) 4 MG tablet Take 1 tablet (4 mg total) by mouth every 8 (eight) hours as needed for nausea or vomiting. 30 tablet 1  ? predniSONE (DELTASONE) 50 MG tablet Take 1 tablet (50 mg total) by mouth daily with breakfast. 5 tablet 0  ? ?No facility-administered medications prior to visit.  ? ? ?Allergies  ?Allergen Reactions  ? Moxifloxacin Anaphylaxis, Other (See Comments) and Hives  ? Avelox [Moxifloxacin Hcl In Nacl]   ? Cymbalta [Duloxetine Hcl]   ? Duloxetine Hives  ? Oxycodone-Acetaminophen Hives  ? ? ?Review of Systems   ?Constitutional:  Negative for chills, fatigue and fever.  ?HENT:  Positive for ear pain and sinus pain. Negative for congestion and sore throat.   ?Respiratory:  Positive for cough, chest tightness and shortness of breath.   ?     Rales  ?  Cardiovascular:  Negative for chest pain and palpitations.  ?Gastrointestinal:  Negative for abdominal pain, constipation, diarrhea, nausea and vomiting.  ?Endocrine: Negative for polydipsia, polyphagia and polyuria.  ?Genitourinary:  Negative for difficulty urinating and dysuria.  ?Musculoskeletal:  Positive for arthralgias and myalgias. Negative for back pain.  ?Skin:  Negative for rash.  ?Neurological:  Positive for headaches.  ?Psychiatric/Behavioral:  Negative for dysphoric mood. The patient is not nervous/anxious.   ? ?   ?Objective:  ?  ?Physical Exam ?Vitals reviewed.  ?Constitutional:   ?   Appearance: Normal appearance.  ?HENT:  ?   Right Ear: Tympanic membrane, ear canal and external ear normal.  ?   Left Ear: Tympanic membrane, ear canal and external ear normal.  ?   Nose: Congestion and rhinorrhea present.  ?   Comments: Sinus tenderness BL. ?   Mouth/Throat:  ?   Pharynx: Oropharynx is clear. No oropharyngeal exudate or posterior oropharyngeal erythema.  ?Cardiovascular:  ?   Rate and Rhythm: Normal rate and regular rhythm.  ?   Heart sounds: Normal heart sounds. No murmur heard. ?Pulmonary:  ?   Effort: Pulmonary effort is normal. No respiratory distress.  ?   Breath sounds: Rhonchi present.  ?Lymphadenopathy:  ?   Cervical: No cervical adenopathy.  ?Neurological:  ?   Mental Status: She is alert and oriented to person, place, and time.  ?Psychiatric:     ?   Mood and Affect: Mood normal.     ?   Behavior: Behavior normal.  ? ? ?BP (!) 146/80   Pulse 85   Temp 99 ?F (37.2 ?C)   Resp 16   Ht 5\' 10"  (1.778 m)   Wt (!) 306 lb (138.8 kg)   SpO2 95%   BMI 43.91 kg/m?  ?Wt Readings from Last 3 Encounters:  ?02/20/22 (!) 306 lb (138.8 kg)  ?02/14/22 (!) 306 lb  (138.8 kg)  ?12/21/21 (!) 307 lb (139.3 kg)  ? ? ?Health Maintenance Due  ?Topic Date Due  ? OPHTHALMOLOGY EXAM  Never done  ? HIV Screening  Never done  ? Hepatitis C Screening  Never done  ? PAP SMEAR-Modifier  02/20/22

## 2022-02-22 ENCOUNTER — Other Ambulatory Visit: Payer: Self-pay | Admitting: Family Medicine

## 2022-02-22 NOTE — Telephone Encounter (Signed)
Refill sent to pharmacy.   

## 2022-02-23 ENCOUNTER — Other Ambulatory Visit: Payer: Self-pay

## 2022-02-23 ENCOUNTER — Encounter: Payer: Self-pay | Admitting: Family Medicine

## 2022-02-23 ENCOUNTER — Telehealth: Payer: Self-pay

## 2022-02-23 DIAGNOSIS — J189 Pneumonia, unspecified organism: Secondary | ICD-10-CM

## 2022-02-23 NOTE — Telephone Encounter (Signed)
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 Please advise and if this is ok, we can write it and make it viewable to my chart for patient to print from there to give to work. ?

## 2022-03-01 ENCOUNTER — Encounter: Payer: Self-pay | Admitting: Family Medicine

## 2022-03-04 MED ORDER — FLUCONAZOLE 150 MG PO TABS: 150.0000 mg | ORAL_TABLET | Freq: Once | ORAL | 0 refills | Status: AC

## 2022-03-05 NOTE — Assessment & Plan Note (Addendum)
Complete Augmentin.  Start doxycycline now. ?Prednisone 20 mg 3 daily for 3 days, 2 daily for 3 days, 1 daily for 3 days. ?Use albuterol 4 times daily for the next 48 hours and then 4 times daily as needed wheezing, shortness of breath. ?Ordering CT scan of chest for tomorrow.  You should receive a call from our office tomorrow morning.  ?Refer to Dr. Thornton in  [place].  ? ? ?

## 2022-03-05 NOTE — Assessment & Plan Note (Addendum)
Complete Augmentin.  Start doxycycline now. ?Prednisone 20 mg 3 daily for 3 days, 2 daily for 3 days, 1 daily for 3 days. ?Use albuterol 4 times daily for the next 48 hours and then 4 times daily as needed wheezing, shortness of breath. ?Ordering CT scan of chest for tomorrow.  You should receive a call from our office tomorrow morning.  ?Refer to Dr. Winona Legato in  [facility].  ? ? ?

## 2022-03-07 ENCOUNTER — Other Ambulatory Visit: Payer: Self-pay | Admitting: Pulmonary Disease

## 2022-03-07 DIAGNOSIS — D869 Sarcoidosis, unspecified: Secondary | ICD-10-CM

## 2022-03-26 ENCOUNTER — Other Ambulatory Visit: Payer: Self-pay | Admitting: Nurse Practitioner

## 2022-03-26 DIAGNOSIS — I10 Essential (primary) hypertension: Secondary | ICD-10-CM

## 2022-04-06 ENCOUNTER — Other Ambulatory Visit: Payer: Self-pay | Admitting: Family Medicine

## 2022-04-06 ENCOUNTER — Telehealth: Payer: Self-pay

## 2022-04-06 MED ORDER — SEMAGLUTIDE (2 MG/DOSE) 8 MG/3ML ~~LOC~~ SOPN: 2.0000 mg | PEN_INJECTOR | SUBCUTANEOUS | 2 refills | Status: DC

## 2022-04-06 NOTE — Telephone Encounter (Signed)
Patient has completed ozempic 1 mg weekly. Requests next dose be sent.   Lorita Officer, CCMA 04/06/22 11:15 AM

## 2022-04-21 ENCOUNTER — Other Ambulatory Visit: Payer: Self-pay | Admitting: Family Medicine

## 2022-05-31 ENCOUNTER — Other Ambulatory Visit: Payer: Self-pay | Admitting: Family Medicine

## 2022-07-18 ENCOUNTER — Other Ambulatory Visit: Payer: Self-pay | Admitting: Nurse Practitioner

## 2022-07-18 DIAGNOSIS — I10 Essential (primary) hypertension: Secondary | ICD-10-CM

## 2022-07-19 ENCOUNTER — Other Ambulatory Visit: Payer: Self-pay | Admitting: Family Medicine

## 2022-07-24 ENCOUNTER — Other Ambulatory Visit: Payer: Self-pay | Admitting: Legal Medicine

## 2022-08-10 ENCOUNTER — Other Ambulatory Visit: Payer: Self-pay | Admitting: Family Medicine

## 2022-08-26 ENCOUNTER — Encounter: Payer: Self-pay | Admitting: Family Medicine

## 2022-09-05 ENCOUNTER — Ambulatory Visit: Payer: BC Managed Care – PPO | Admitting: Family Medicine

## 2022-09-05 VITALS — BP 120/72 | HR 84 | Temp 97.2°F | Resp 18 | Ht 70.5 in | Wt 303.0 lb

## 2022-09-05 DIAGNOSIS — Z23 Encounter for immunization: Secondary | ICD-10-CM | POA: Diagnosis not present

## 2022-09-05 DIAGNOSIS — E039 Hypothyroidism, unspecified: Secondary | ICD-10-CM | POA: Diagnosis not present

## 2022-09-05 DIAGNOSIS — E1142 Type 2 diabetes mellitus with diabetic polyneuropathy: Secondary | ICD-10-CM | POA: Diagnosis not present

## 2022-09-05 DIAGNOSIS — S39012A Strain of muscle, fascia and tendon of lower back, initial encounter: Secondary | ICD-10-CM

## 2022-09-05 DIAGNOSIS — I10 Essential (primary) hypertension: Secondary | ICD-10-CM

## 2022-09-05 MED ORDER — CYCLOBENZAPRINE HCL 5 MG PO TABS: 5.0000 mg | ORAL_TABLET | Freq: Three times a day (TID) | ORAL | 1 refills | Status: DC | PRN

## 2022-09-05 MED ORDER — MELOXICAM 15 MG PO TABS: 15.0000 mg | ORAL_TABLET | Freq: Every day | ORAL | 0 refills | Status: DC

## 2022-09-05 NOTE — Patient Instructions (Addendum)
Start on meloxicam 15 mg daily  Start on flexeril 5 mg three times a day as needed  Recommend use tylenol 500 mg 2 oral four times a day as needed for break through pain.   Increase gabapentin 300 mg 2 twice a day for neuropathy.

## 2022-09-05 NOTE — Progress Notes (Unsigned)
Acute Office Visit  Subjective:    Patient ID: Holly [name], female    DOB: [birth year], 56 y.o.   MRN: [id number]  No chief complaint on file.   HPI: DIABETES:  Not checking sugars.  Checking feet daily.  Eye doctor appt next week.  ON gabapentin 300 mg 2 whs.   Hypertension: Irbesartan 75 mg once daily.  Depression: on wellbutrin xl 300 mg once daily.   Patient is in today for back pain.  She was cranking a trailer yesterday and felt tightness in her low back.  A short while later she tripped on a log in the leaves and fell.  Took tylenol  Past Medical History:  Diagnosis Date   Anxiety    COVID-19 virus detected 12/30/2019   10/30/2019-SARS-CoV-2-positive Didn't require hospitalization     Depression    Diabetes mellitus without complication ([place])    Fatty liver    Goiter    Hashimoto's thyroiditis    Hilar adenopathy 03/20/19  04/11/19   CTA CHEST and PET per DR. Lauretta Chester [facility]   History of kidney stones    Hypertension    Mediastinal adenopathy    PER CTA CHEST PET SCAN per DR. Beryle Lathe [facility]   Pneumonia    Supraclavicular adenopathy 03/20/2019   PER CTA CHEST and PET per DR. Renelda Mom [facility]    Past Surgical History:  Procedure Laterality Date   ABDOMINAL HYSTERECTOMY     COMPLETE   ADHESIOLYSIS     APPENDECTOMY     CESAREAN SECTION     MEDIASTINOSCOPY N/A 04/18/2019   Procedure: MEDIASTINOSCOPY;  Surgeon: Grace Isaac, MD;  Location: [place];  Service: Thoracic;  Laterality: N/A;   VIDEO BRONCHOSCOPY WITH ENDOBRONCHIAL ULTRASOUND N/A 04/18/2019   Procedure: VIDEO BRONCHOSCOPY WITH ENDOBRONCHIAL ULTRASOUND;  Surgeon: Grace Isaac, MD;  Location: [place] [facility];  Service: Thoracic;  Laterality: N/A;    Family History  Problem Relation Age of Onset   Breast cancer Mother    Anuerysm Mother        BRAIN   Cancer Mother        BREAST   COPD Father    Hyperlipidemia Father    Congestive Heart Failure Father    Bipolar disorder Sister    Diabetes Sister     Fibromyalgia Sister    Cancer Paternal Uncle        LUNG   Breast cancer Maternal Grandmother    Cancer Maternal Grandmother 54       BREAST   Cancer Paternal Grandfather        LUNG    Social History   Socioeconomic History   Marital status: Divorced    Spouse name: Not on file   Number of children: Not on file   Years of education: Not on file   Highest education level: Not on file  Occupational History   Not on file  Tobacco Use   Smoking status: Never   Smokeless tobacco: Never  Vaping Use   Vaping Use: Never used  Substance and Sexual Activity   Alcohol use: Yes    Comment: VERY RARE OCCASION   Drug use: Never   Sexual activity: Not on file  Other Topics Concern   Not on file  Social History Narrative   Not on file   Social Determinants of Health   Financial Resource Strain: Not on file  Food Insecurity: Not on file  Transportation Needs: Not on file  Physical Activity: Not on file  Stress: Not on file  Social Connections: Not on file  Intimate Partner Violence: Not on file    Outpatient Medications Prior to Visit  Medication Sig Dispense Refill   albuterol (VENTOLIN HFA) 108 (90 Base) MCG/ACT inhaler Inhale 2 puffs into the lungs every 6 (six) hours as needed for wheezing or shortness of breath. 18 g 2   budesonide-formoterol (SYMBICORT) 160-4.5 MCG/ACT inhaler Inhale 2 puffs into the lungs 2 (two) times daily. 1 each 3   buPROPion (WELLBUTRIN XL) 300 MG 24 hr tablet TAKE 1 TABLET BY MOUTH EVERY DAY 90 tablet 0   famotidine (PEPCID) 40 MG tablet TAKE 1 TABLET BY MOUTH TWICE A DAY 180 tablet 1   fluticasone (FLONASE) 50 MCG/ACT nasal spray Place 2 sprays into both nostrils daily. 16 g 6   gabapentin (NEURONTIN) 300 MG capsule Take 1 capsule (300 mg total) by mouth 2 (two) times daily. 180 capsule 3   irbesartan (AVAPRO) 75 MG tablet TAKE 1 TABLET BY MOUTH EVERY DAY 90 tablet 0   levothyroxine (SYNTHROID) 25 MCG tablet TAKE 1 TABLET BY MOUTH EVERY DAY BEFORE  BREAKFAST 90 tablet 0   loratadine (CLARITIN) 10 MG tablet TAKE 1 TABLET BY MOUTH EVERY DAY 90 tablet 0   metFORMIN (GLUCOPHAGE) 1000 MG tablet TAKE 1 TABLET (1,000 MG TOTAL) BY MOUTH 2 (TWO) TIMES DAILY WITH A MEAL. PLEASE CALL FOR A FASTING FOLLOW UP APPOINTMENT. THANK YOU, DR. Aleana Fifita 180 tablet 2   rosuvastatin (CRESTOR) 20 MG tablet TAKE 1 TABLET BY MOUTH EVERY DAY 90 tablet 1   SODIUM FLUORIDE 5000 PPM 1.1 % PSTE See admin instructions.     traZODone (DESYREL) 150 MG tablet TAKE 1 TABLET BY MOUTH EVERYDAY AT BEDTIME 90 tablet 0   venlafaxine XR (EFFEXOR-XR) 75 MG 24 hr capsule TAKE 3 CAPSULES BY MOUTH DAILY IN THE MORNING 270 capsule 1   amoxicillin-clavulanate (AUGMENTIN) 875-125 MG tablet Take 1 tablet by mouth 2 (two) times daily. 20 tablet 0   doxycycline (VIBRA-TABS) 100 MG tablet Take 1 tablet (100 mg total) by mouth 2 (two) times daily. 20 tablet 0   ondansetron (ZOFRAN) 4 MG tablet Take 1 tablet (4 mg total) by mouth every 8 (eight) hours as needed for nausea or vomiting. 20 tablet 0   Semaglutide, 2 MG/DOSE, 8 MG/3ML SOPN Inject 2 mg as directed once a week. 3 mL 2   Vitamin D, Ergocalciferol, (DRISDOL) 1.25 MG (50000 UNIT) CAPS capsule Take 1 capsule (50,000 Units total) by mouth every 7 (seven) days. 12 capsule 0   No facility-administered medications prior to visit.    Allergies  Allergen Reactions   Moxifloxacin Anaphylaxis, Other (See Comments) and Hives   Avelox [Moxifloxacin Hcl In Nacl]    Cymbalta [Duloxetine Hcl]    Duloxetine Hives   Oxycodone-Acetaminophen Hives    Review of Systems  Constitutional:  Negative for chills and fever.  Respiratory:  Negative for cough.   Cardiovascular:  Negative for chest pain.  Gastrointestinal:  Positive for nausea.  Musculoskeletal:  Positive for back pain.  Neurological:  Negative for headaches.       Objective:    Physical Exam  BP 120/72   Pulse 84   Temp (!) 97.2 F (36.2 C)   Resp 18   Ht 5' 10.5" (1.791 m)    Wt (!) 303 lb (137.4 kg)   BMI 42.86 kg/m  Wt Readings from Last 3 Encounters:  09/05/22 (!) 303 lb (137.4 kg)  02/20/22 (!) 306 lb (  138.8 kg)  02/14/22 (!) 306 lb (138.8 kg)    Health Maintenance Due  Topic Date Due   OPHTHALMOLOGY EXAM  Never done   HIV Screening  Never done   Hepatitis C Screening  Never done   PAP SMEAR-Modifier  Never done   COVID-19 Vaccine (3 - Moderna series) 03/15/2020   Zoster Vaccines- Shingrix (2 of 2) 08/05/2021   INFLUENZA VACCINE  05/23/2022   HEMOGLOBIN A1C  06/23/2022    There are no preventive care reminders to display for this patient.   Lab Results  Component Value Date   TSH 1.680 12/21/2021   Lab Results  Component Value Date   WBC 8.6 12/21/2021   HGB 15.3 12/21/2021   HCT 44.4 12/21/2021   MCV 88 12/21/2021   PLT 252 12/21/2021   Lab Results  Component Value Date   NA 142 12/21/2021   K 4.6 12/21/2021   CO2 24 12/21/2021   GLUCOSE 137 (H) 12/21/2021   BUN 11 12/21/2021   CREATININE 0.77 12/21/2021   BILITOT 0.4 12/21/2021   ALKPHOS 117 12/21/2021   AST 33 12/21/2021   ALT 39 (H) 12/21/2021   PROT 7.4 12/21/2021   ALBUMIN 4.6 12/21/2021   CALCIUM 9.8 12/21/2021   ANIONGAP 11 04/16/2019   EGFR 91 12/21/2021   GFR 71.87 12/31/2019   Lab Results  Component Value Date   CHOL 156 12/21/2021   Lab Results  Component Value Date   HDL 49 12/21/2021   Lab Results  Component Value Date   LDLCALC 75 12/21/2021   Lab Results  Component Value Date   TRIG 191 (H) 12/21/2021   Lab Results  Component Value Date   CHOLHDL 3.2 12/21/2021   Lab Results  Component Value Date   HGBA1C 7.6 (H) 12/21/2021       Assessment & Plan:   Problem List Items Addressed This Visit       Cardiovascular and Mediastinum   Hypertension   Relevant Orders   CBC with Differential/Platelet   Comprehensive metabolic panel     Endocrine   Hypothyroidism (acquired)   Relevant Orders   TSH   Diabetic polyneuropathy (HCC)    Relevant Medications   cyclobenzaprine (FLEXERIL) 5 MG tablet   Other Relevant Orders   Hemoglobin A1c   Other Visit Diagnoses     Strain of lumbar region, initial encounter    -  Primary   Relevant Medications   cyclobenzaprine (FLEXERIL) 5 MG tablet   meloxicam (MOBIC) 15 MG tablet      Meds ordered this encounter  Medications   cyclobenzaprine (FLEXERIL) 5 MG tablet    Sig: Take 1 tablet (5 mg total) by mouth 3 (three) times daily as needed for muscle spasms.    Dispense:  60 tablet    Refill:  1   meloxicam (MOBIC) 15 MG tablet    Sig: Take 1 tablet (15 mg total) by mouth daily.    Dispense:  30 tablet    Refill:  0    Orders Placed This Encounter  Procedures   CBC with Differential/Platelet   Comprehensive metabolic panel   Hemoglobin A1c   TSH     Follow-up: Return in about 6 weeks (around 10/17/2022) for chronic follow up.  An After Visit Summary was printed and given to the patient.  Geralynn Ochs I Leal-Borjas,acting as a scribe for Rochel Brome, MD.,have documented all relevant documentation on the behalf of Rochel Brome, MD,as directed by  Rochel Brome, MD while  in the presence of Rochel Brome, MD.   Rochel Brome, MD [place] [phone]

## 2022-09-06 LAB — HEMOGLOBIN A1C
Est. average glucose Bld gHb Est-mCnc: 157 mg/dL
Hgb A1c MFr Bld: 7.1 % — ABNORMAL HIGH (ref 4.8–5.6)

## 2022-09-06 LAB — CBC WITH DIFFERENTIAL/PLATELET
Basophils Absolute: 0 10*3/uL (ref 0.0–0.2)
Basos: 1 %
EOS (ABSOLUTE): 0.1 10*3/uL (ref 0.0–0.4)
Eos: 1 %
Hematocrit: 45.8 % (ref 34.0–46.6)
Hemoglobin: 15.4 g/dL (ref 11.1–15.9)
Immature Grans (Abs): 0 10*3/uL (ref 0.0–0.1)
Immature Granulocytes: 1 %
Lymphocytes Absolute: 1.8 10*3/uL (ref 0.7–3.1)
Lymphs: 23 %
MCH: 30.4 pg (ref 26.6–33.0)
MCHC: 33.6 g/dL (ref 31.5–35.7)
MCV: 91 fL (ref 79–97)
Monocytes Absolute: 0.4 10*3/uL (ref 0.1–0.9)
Monocytes: 5 %
Neutrophils Absolute: 5.4 10*3/uL (ref 1.4–7.0)
Neutrophils: 69 %
Platelets: 282 10*3/uL (ref 150–450)
RBC: 5.06 x10E6/uL (ref 3.77–5.28)
RDW: 12 % (ref 11.7–15.4)
WBC: 7.8 10*3/uL (ref 3.4–10.8)

## 2022-09-06 LAB — COMPREHENSIVE METABOLIC PANEL
ALT: 21 IU/L (ref 0–32)
AST: 19 IU/L (ref 0–40)
Albumin/Globulin Ratio: 1.5 (ref 1.2–2.2)
Albumin: 4.3 g/dL (ref 3.8–4.9)
Alkaline Phosphatase: 109 IU/L (ref 44–121)
BUN/Creatinine Ratio: 18 (ref 9–23)
BUN: 13 mg/dL (ref 6–24)
Bilirubin Total: 0.3 mg/dL (ref 0.0–1.2)
CO2: 23 mmol/L (ref 20–29)
Calcium: 9.3 mg/dL (ref 8.7–10.2)
Chloride: 101 mmol/L (ref 96–106)
Creatinine, Ser: 0.73 mg/dL (ref 0.57–1.00)
Globulin, Total: 2.9 g/dL (ref 1.5–4.5)
Glucose: 217 mg/dL — ABNORMAL HIGH (ref 70–99)
Potassium: 4.5 mmol/L (ref 3.5–5.2)
Sodium: 138 mmol/L (ref 134–144)
Total Protein: 7.2 g/dL (ref 6.0–8.5)
eGFR: 97 mL/min/{1.73_m2} (ref >59)

## 2022-09-06 LAB — TSH: TSH: 1.74 u[IU]/mL (ref 0.450–4.500)

## 2022-09-06 NOTE — Progress Notes (Signed)
Blood count normal.  Liver function normal.  Kidney function normal.  Thyroid function normal.  Sugar 217. HBA1C: 7.1. Patient was previously on ozempic. Please ask if she did not tolerate ozempic or stopped because of cost? She may have told me but I cannot recall. Please let me know. I would like her A1c less than 6.5. She is very young and if we can improve her sugars, it will decrease her risk of heart disease. If tolerated ozempic, recommend rybelsus 3 mg daily (can pick up samples) x 1 month, then increase to 7 mg daily.  Patient also needs to be seen every 3 months for her diabetes.

## 2022-09-07 ENCOUNTER — Encounter: Payer: Self-pay | Admitting: Family Medicine

## 2022-09-10 DIAGNOSIS — S39012A Strain of muscle, fascia and tendon of lower back, initial encounter: Secondary | ICD-10-CM

## 2022-09-10 HISTORY — DX: Strain of muscle, fascia and tendon of lower back, initial encounter: S39.012A

## 2022-09-10 NOTE — Assessment & Plan Note (Addendum)
Start on meloxicam 15 mg daily  Start on flexeril 5 mg three times a day as needed  Recommend use tylenol 500 mg 2 oral four times a day as needed for break through pain.

## 2022-09-10 NOTE — Assessment & Plan Note (Addendum)
Control: Fair Recommend check sugars fasting daily. Recommend check feet daily. Recommend annual eye exams. Medicines:  Metformin 1000 mg BID, semaglutide Inject 2 mg weekly. Continue to work on eating a healthy diet and exercise.  Labs drawn today.     Increase gabapentin 300 mg 2 twice a day for neuropathy.

## 2022-09-10 NOTE — Assessment & Plan Note (Signed)
Previously well controlled Continue Synthroid at current dose  Recheck TSH and adjust Synthroid as indicated   

## 2022-09-10 NOTE — Assessment & Plan Note (Addendum)
Well controlled.  No changes to medicines. Irbesartan 75 mg once daily.  Continue to work on eating a healthy diet and exercise.  Labs drawn today.   

## 2022-09-11 ENCOUNTER — Encounter: Payer: Self-pay | Admitting: Family Medicine

## 2022-09-13 ENCOUNTER — Other Ambulatory Visit: Payer: Self-pay

## 2022-09-24 ENCOUNTER — Encounter: Payer: Self-pay | Admitting: Family Medicine

## 2022-09-26 ENCOUNTER — Other Ambulatory Visit: Payer: Self-pay

## 2022-09-26 DIAGNOSIS — R42 Dizziness and giddiness: Secondary | ICD-10-CM

## 2022-09-26 DIAGNOSIS — E1142 Type 2 diabetes mellitus with diabetic polyneuropathy: Secondary | ICD-10-CM

## 2022-09-26 MED ORDER — GABAPENTIN 300 MG PO CAPS: 600.0000 mg | ORAL_CAPSULE | Freq: Two times a day (BID) | ORAL | 0 refills | Status: DC

## 2022-10-02 ENCOUNTER — Other Ambulatory Visit: Payer: Self-pay | Admitting: Family Medicine

## 2022-10-02 DIAGNOSIS — S39012A Strain of muscle, fascia and tendon of lower back, initial encounter: Secondary | ICD-10-CM

## 2022-10-12 ENCOUNTER — Ambulatory Visit: Payer: BC Managed Care – PPO | Admitting: Family Medicine

## 2022-10-26 ENCOUNTER — Telehealth: Payer: Self-pay

## 2022-10-26 ENCOUNTER — Other Ambulatory Visit: Payer: Self-pay | Admitting: Family Medicine

## 2022-10-26 ENCOUNTER — Other Ambulatory Visit: Payer: Self-pay | Admitting: Nurse Practitioner

## 2022-10-26 DIAGNOSIS — I10 Essential (primary) hypertension: Secondary | ICD-10-CM

## 2022-10-26 MED ORDER — CLONAZEPAM 0.5 MG PO TABS: 0.5000 mg | ORAL_TABLET | Freq: Two times a day (BID) | ORAL | 1 refills | Status: DC | PRN

## 2022-10-26 NOTE — Telephone Encounter (Signed)
Patient called states that she lost her father Dec 14.  Since then her anxiety has been a lot worse as well as her depression.  She denies suicidal thoughts.  Patient states that she has been having anxiety related chest pain for weeks.  I can schedule her next week but is there anything you can do in the mean time to help?

## 2022-10-26 NOTE — Telephone Encounter (Signed)
Patient was notified and she agreed to go to the Urgent Care for evaluation and treatment.

## 2022-10-26 NOTE — Telephone Encounter (Signed)
Holly [name] called back after her evaluation with [facility] in [place].  Her EKG and evaluation was not concerning for cardiac issues.  Dr. Tobie Poet sent medication in to the pharmacy for anxiety.  Follow-up appointment is scheduled for Monday Jan 16.

## 2022-11-02 ENCOUNTER — Encounter: Payer: Self-pay | Admitting: Family Medicine

## 2022-11-06 ENCOUNTER — Ambulatory Visit: Payer: BC Managed Care – PPO | Admitting: Family Medicine

## 2022-11-09 ENCOUNTER — Ambulatory Visit: Payer: BC Managed Care – PPO | Admitting: Family Medicine

## 2022-11-09 VITALS — BP 130/70 | HR 96 | Temp 97.3°F | Resp 16 | Ht 70.0 in | Wt 306.0 lb

## 2022-11-09 DIAGNOSIS — F99 Mental disorder, not otherwise specified: Secondary | ICD-10-CM

## 2022-11-09 DIAGNOSIS — F5105 Insomnia due to other mental disorder: Secondary | ICD-10-CM

## 2022-11-09 DIAGNOSIS — D869 Sarcoidosis, unspecified: Secondary | ICD-10-CM | POA: Diagnosis not present

## 2022-11-09 DIAGNOSIS — F332 Major depressive disorder, recurrent severe without psychotic features: Secondary | ICD-10-CM | POA: Diagnosis not present

## 2022-11-09 MED ORDER — BUDESONIDE-FORMOTEROL FUMARATE 160-4.5 MCG/ACT IN AERO: 2.0000 | INHALATION_SPRAY | Freq: Two times a day (BID) | RESPIRATORY_TRACT | 3 refills | Status: AC

## 2022-11-09 MED ORDER — ZOLPIDEM TARTRATE ER 12.5 MG PO TBCR: 12.5000 mg | EXTENDED_RELEASE_TABLET | Freq: Every evening | ORAL | 0 refills | Status: DC | PRN

## 2022-11-09 MED ORDER — BUSPIRONE HCL 7.5 MG PO TABS: 7.5000 mg | ORAL_TABLET | Freq: Two times a day (BID) | ORAL | 0 refills | Status: DC

## 2022-11-09 NOTE — Patient Instructions (Addendum)
Add buspirone 7.5 mg one twice daily.  Continue venlafaxine at current dose.  May refill clonazepam.  Stop trazodone.  Start ambien cr 12.5 mg once at night.

## 2022-11-09 NOTE — Progress Notes (Signed)
Subjective:  Patient ID: Holly [name], female    DOB: 1965-10-26  Age: 57 y.o. MRN: [id number]  Chief Complaint  Patient presents with   Anxiety   Panic Attack    HPI GAD/PANIC ATTACKS. Patient called states that she lost her father Oct 05, 2022 - was in hospice. Patient has had 2 other family members have passed. 2 cousins passed. Since then her anxiety has been a lot worse as well as her depression.  She denies suicidal thoughts.  Patient states that she has been having anxiety related chest pain for weeks.  I can schedule her next week but is there anything you can do in the mean time to help?  She was seen at [facility]: EKG normal. Told it was not her heart.  Chest pain occurs a few times per day. Lasts for few minutes to a few hours. Has had some shortness of breath at times. Chest pain centrally. The anxiety pill helps and sleeping helps.   Previously tried zoloft, wellbutrin xl, paxil,  cymbalta (allergic reaction.) Never tried trintillex, fetzima, viibryd, buspar.   Talking to a grief counselor.   Trazodone is not helping sleep. Previously on [place].       11/09/2022    2:39 PM  GAD 7 : Generalized Anxiety Score  Nervous, Anxious, on Edge 3  Control/stop worrying 3  Worry too much - different things 3  Trouble relaxing 3  Restless 1  Easily annoyed or irritable 2  Afraid - awful might happen 1  Total GAD 7 Score 16  Anxiety Difficulty Very difficult        11/09/2022    2:37 PM 09/05/2022   10:37 AM 02/03/2021    7:50 AM  PHQ9 SCORE ONLY  PHQ-9 Total Score 17 15 12     Current Outpatient Medications on File Prior to Visit  Medication Sig Dispense Refill   albuterol (VENTOLIN HFA) 108 (90 Base) MCG/ACT inhaler Inhale 2 puffs into the lungs every 6 (six) hours as needed for wheezing or shortness of breath. 18 g 2   clonazePAM (KLONOPIN) 0.5 MG tablet Take 1 tablet (0.5 mg total) by mouth 2 (two) times daily as needed for anxiety. 10 tablet 1   cyclobenzaprine  (FLEXERIL) 5 MG tablet Take 1 tablet (5 mg total) by mouth 3 (three) times daily as needed for muscle spasms. 60 tablet 1   famotidine (PEPCID) 40 MG tablet TAKE 1 TABLET BY MOUTH TWICE A DAY 180 tablet 1   fluticasone (FLONASE) 50 MCG/ACT nasal spray Place 2 sprays into both nostrils daily. 16 g 6   gabapentin (NEURONTIN) 300 MG capsule Take 2 capsules (600 mg total) by mouth 2 (two) times daily. 360 capsule 0   irbesartan (AVAPRO) 75 MG tablet TAKE 1 TABLET BY MOUTH EVERY DAY 90 tablet 0   levothyroxine (SYNTHROID) 25 MCG tablet TAKE 1 TABLET BY MOUTH EVERY DAY BEFORE BREAKFAST 90 tablet 0   loratadine (CLARITIN) 10 MG tablet TAKE 1 TABLET BY MOUTH EVERY DAY 90 tablet 0   meloxicam (MOBIC) 15 MG tablet TAKE 1 TABLET (15 MG TOTAL) BY MOUTH DAILY. 30 tablet 0   metFORMIN (GLUCOPHAGE) 1000 MG tablet TAKE 1 TABLET (1,000 MG TOTAL) BY MOUTH 2 (TWO) TIMES DAILY WITH A MEAL. PLEASE CALL FOR A FASTING FOLLOW UP APPOINTMENT. THANK YOU, DR. Ralphael Southgate 180 tablet 2   rosuvastatin (CRESTOR) 20 MG tablet TAKE 1 TABLET BY MOUTH EVERY DAY 90 tablet 1   Semaglutide (RYBELSUS) 3 MG  TABS Take 3 mg by mouth daily. Take 1 daily     traZODone (DESYREL) 150 MG tablet TAKE 1 TABLET BY MOUTH EVERYDAY AT BEDTIME 90 tablet 0   venlafaxine XR (EFFEXOR-XR) 75 MG 24 hr capsule TAKE 3 CAPSULES BY MOUTH DAILY IN THE MORNING 270 capsule 1   SODIUM FLUORIDE 5000 PPM 1.1 % PSTE See admin instructions.     No current facility-administered medications on file prior to visit.   Past Medical History:  Diagnosis Date   Anxiety    COVID-19 virus detected 12/30/2019   10/30/2019-SARS-CoV-2-positive Didn't require hospitalization     Depression    Diabetes mellitus without complication ([place])    Fatty liver    Goiter    Hashimoto's thyroiditis    Hilar adenopathy 03/20/19  04/11/19   CTA CHEST and PET per DR. Lauretta Chester [facility]   History of kidney stones    Hypertension    Mediastinal adenopathy    PER CTA CHEST PET SCAN per DR. Beryle Lathe  [facility]   Pneumonia    Supraclavicular adenopathy 03/20/2019   PER CTA CHEST and PET per DR. Renelda Mom [facility]   Past Surgical History:  Procedure Laterality Date   ABDOMINAL HYSTERECTOMY     COMPLETE   ADHESIOLYSIS     APPENDECTOMY     CESAREAN SECTION     MEDIASTINOSCOPY N/A 04/18/2019   Procedure: MEDIASTINOSCOPY;  Surgeon: Grace Isaac, MD;  Location: [facility];  Service: Thoracic;  Laterality: N/A;   VIDEO BRONCHOSCOPY WITH ENDOBRONCHIAL ULTRASOUND N/A 04/18/2019   Procedure: VIDEO BRONCHOSCOPY WITH ENDOBRONCHIAL ULTRASOUND;  Surgeon: Grace Isaac, MD;  Location: [place];  Service: Thoracic;  Laterality: N/A;    Family History  Problem Relation Age of Onset   Breast cancer Mother    Anuerysm Mother        BRAIN   Cancer Mother        BREAST   COPD Father    Hyperlipidemia Father    Congestive Heart Failure Father    Bipolar disorder Sister    Diabetes Sister    Fibromyalgia Sister    Cancer Paternal Uncle        LUNG   Breast cancer Maternal Grandmother    Cancer Maternal Grandmother 29       BREAST   Cancer Paternal Grandfather        LUNG   Social History   Socioeconomic History   Marital status: Divorced    Spouse name: Not on file   Number of children: Not on file   Years of education: Not on file   Highest education level: Not on file  Occupational History   Not on file  Tobacco Use   Smoking status: Never   Smokeless tobacco: Never  Vaping Use   Vaping Use: Never used  Substance and Sexual Activity   Alcohol use: Yes    Comment: VERY RARE OCCASION   Drug use: Never   Sexual activity: Not on file  Other Topics Concern   Not on file  Social History Narrative   Not on file   Social Determinants of Health   Financial Resource Strain: Not on file  Food Insecurity: Not on file  Transportation Needs: Not on file  Physical Activity: Not on file  Stress: Not on file  Social Connections: Not on file    Review of Systems  Constitutional:   Positive for fatigue. Negative for chills and fever.  HENT:  Positive for congestion.   Respiratory:  Positive  for shortness of breath. Negative for cough.   Cardiovascular:  Positive for palpitations. Negative for chest pain.  Genitourinary:  Negative for dysuria.  Neurological:  Positive for dizziness and headaches.  Psychiatric/Behavioral:  Positive for dysphoric mood. The patient is nervous/anxious.      Objective:  BP 130/70   Pulse 96   Temp (!) 97.3 F (36.3 C)   Resp 16   Ht 5\' 10"  (1.778 m)   Wt (!) 306 lb (138.8 kg)   BMI 43.91 kg/m      11/09/2022    2:32 PM 09/05/2022   10:02 AM 02/20/2022    4:03 PM  BP/Weight  Systolic BP 130 120 146  Diastolic BP 70 72 80  Wt. (Lbs) 306 303 306  BMI 43.91 kg/m2 42.86 kg/m2 43.91 kg/m2    Physical Exam Vitals reviewed.  Constitutional:      Appearance: Normal appearance.  Cardiovascular:     Rate and Rhythm: Normal rate and regular rhythm.     Heart sounds: Normal heart sounds.  Pulmonary:     Effort: Pulmonary effort is normal.     Breath sounds: Normal breath sounds.  Neurological:     Mental Status: She is alert and oriented to person, place, and time.  Psychiatric:        Behavior: Behavior normal.     Comments: Depressed.      Diabetic Foot Exam - Simple   No data filed      Lab Results  Component Value Date   WBC 7.8 09/05/2022   HGB 15.4 09/05/2022   HCT 45.8 09/05/2022   PLT 282 09/05/2022   GLUCOSE 217 (H) 09/05/2022   CHOL 156 12/21/2021   TRIG 191 (H) 12/21/2021   HDL 49 12/21/2021   LDLCALC 75 12/21/2021   ALT 21 09/05/2022   AST 19 09/05/2022   NA 138 09/05/2022   K 4.5 09/05/2022   CL 101 09/05/2022   CREATININE 0.73 09/05/2022   BUN 13 09/05/2022   CO2 23 09/05/2022   TSH 1.740 09/05/2022   INR 1.0 04/16/2019   HGBA1C 7.1 (H) 09/05/2022   MICROALBUR 80 08/15/2020      Assessment & Plan:   Carnita was seen today for anxiety and panic attack.  Severe episode of recurrent major  depressive disorder, without psychotic features [facility]) Assessment & Plan: Add buspirone 7.5 mg one twice daily.  Continue venlafaxine at current dose.  May refill clonazepam.   Orders: -     busPIRone HCl; Take 1 tablet (7.5 mg total) by mouth 2 (two) times daily.  Dispense: 60 tablet; Refill: 0  Sarcoidosis Overview: PER PET SCAN per [facility]  Orders: -     Budesonide-Formoterol Fumarate; Inhale 2 puffs into the lungs 2 (two) times daily.  Dispense: 1 each; Refill: 3  Insomnia due to other mental disorder Assessment & Plan: Stop trazodone.  Start ambien cr 12.5 mg once at night.   Orders: -     Zolpidem Tartrate ER; Take 1 tablet (12.5 mg total) by mouth at bedtime as needed for sleep.  Dispense: 30 tablet; Refill: 0     Meds ordered this encounter  Medications   busPIRone (BUSPAR) 7.5 MG tablet    Sig: Take 1 tablet (7.5 mg total) by mouth 2 (two) times daily.    Dispense:  60 tablet    Refill:  0   budesonide-formoterol (SYMBICORT) 160-4.5 MCG/ACT inhaler    Sig: Inhale 2 puffs into the lungs 2 (two)  times daily.    Dispense:  1 each    Refill:  3   zolpidem (AMBIEN CR) 12.5 MG CR tablet    Sig: Take 1 tablet (12.5 mg total) by mouth at bedtime as needed for sleep.    Dispense:  30 tablet    Refill:  0      Follow-up: Return in about 4 weeks (around 12/07/2022) for chronic fasting.  An After Visit Summary was printed and given to the patient.  Blane Ohara, MD [facility] [phone]

## 2022-11-12 ENCOUNTER — Encounter: Payer: Self-pay | Admitting: Family Medicine

## 2022-11-12 DIAGNOSIS — G4700 Insomnia, unspecified: Secondary | ICD-10-CM

## 2022-11-12 HISTORY — DX: Insomnia, unspecified: G47.00

## 2022-11-12 NOTE — Assessment & Plan Note (Signed)
Add buspirone 7.5 mg one twice daily.  Continue venlafaxine at current dose.  May refill clonazepam.

## 2022-11-12 NOTE — Assessment & Plan Note (Signed)
Stop trazodone.  Start ambien cr 12.5 mg once at night.

## 2022-11-20 ENCOUNTER — Encounter: Payer: Self-pay | Admitting: Family Medicine

## 2022-11-21 ENCOUNTER — Other Ambulatory Visit: Payer: Self-pay | Admitting: Family Medicine

## 2022-11-21 ENCOUNTER — Encounter: Payer: Self-pay | Admitting: Family Medicine

## 2022-11-21 MED ORDER — RYBELSUS 7 MG PO TABS: 7.0000 mg | ORAL_TABLET | Freq: Every day | ORAL | 0 refills | Status: DC

## 2022-11-21 NOTE — Telephone Encounter (Signed)
Sent rybelsus 7 mg daily. Dr. Tobie Poet

## 2022-12-01 ENCOUNTER — Ambulatory Visit (INDEPENDENT_AMBULATORY_CARE_PROVIDER_SITE_OTHER): Payer: BC Managed Care – PPO | Admitting: Physician Assistant

## 2022-12-01 ENCOUNTER — Encounter: Payer: Self-pay | Admitting: Physician Assistant

## 2022-12-01 VITALS — BP 130/70 | HR 85 | Temp 98.1°F | Resp 15 | Ht 70.0 in | Wt 307.0 lb

## 2022-12-01 DIAGNOSIS — J9801 Acute bronchospasm: Secondary | ICD-10-CM | POA: Insufficient documentation

## 2022-12-01 DIAGNOSIS — J069 Acute upper respiratory infection, unspecified: Secondary | ICD-10-CM | POA: Insufficient documentation

## 2022-12-01 LAB — POCT INFLUENZA A/B
Influenza A, POC: NEGATIVE
Influenza B, POC: NEGATIVE

## 2022-12-01 LAB — POC COVID19 BINAXNOW: SARS Coronavirus 2 Ag: NEGATIVE

## 2022-12-01 MED ORDER — PROMETHAZINE-DM 6.25-15 MG/5ML PO SYRP: 5.0000 mL | ORAL_SOLUTION | Freq: Four times a day (QID) | ORAL | 0 refills | Status: DC | PRN

## 2022-12-01 MED ORDER — AZITHROMYCIN 250 MG PO TABS: ORAL_TABLET | ORAL | 0 refills | Status: AC

## 2022-12-01 MED ORDER — AZITHROMYCIN 250 MG PO TABS: ORAL_TABLET | ORAL | 0 refills | Status: DC

## 2022-12-01 MED ORDER — PREDNISONE 20 MG PO TABS: ORAL_TABLET | ORAL | 0 refills | Status: AC

## 2022-12-01 MED ORDER — PREDNISONE 20 MG PO TABS: ORAL_TABLET | ORAL | 0 refills | Status: DC

## 2022-12-01 NOTE — Addendum Note (Signed)
Addended by: Marge Duncans on: 12/01/2022 11:35 AM   Modules accepted: Orders

## 2022-12-01 NOTE — Progress Notes (Signed)
Acute Office Visit  Subjective:    Patient ID: Holly [name], female    DOB: 08-22-1966, 57 y.o.   MRN: [id number]  Chief Complaint  Patient presents with   Cough   Facial Pain    HPI Patient is in today for complaints of sinus pressure, pnd, and cough.  States she has had some mild wheezing and cough is productive - has been using inhalers as directed Pt states has had symptoms for about a week and have progressively worsened  Past Medical History:  Diagnosis Date   Anxiety    COVID-19 virus detected 12/30/2019   10/30/2019-SARS-CoV-2-positive Didn't require hospitalization     Depression    Diabetes mellitus without complication ([place])    Fatty liver    Goiter    Hashimoto's thyroiditis    Hilar adenopathy 03/20/19  04/11/19   CTA CHEST and PET per DR. Lauretta Chester [facility]   History of kidney stones    Hypertension    Mediastinal adenopathy    PER CTA CHEST PET SCAN per DR. Beryle Lathe [facility]   Pneumonia    Supraclavicular adenopathy 03/20/2019   PER CTA CHEST and PET per DR. Renelda Mom [facility]    Past Surgical History:  Procedure Laterality Date   ABDOMINAL HYSTERECTOMY     COMPLETE   ADHESIOLYSIS     APPENDECTOMY     CESAREAN SECTION     MEDIASTINOSCOPY N/A 04/18/2019   Procedure: MEDIASTINOSCOPY;  Surgeon: Grace Isaac, MD;  Location: [place];  Service: Thoracic;  Laterality: N/A;   VIDEO BRONCHOSCOPY WITH ENDOBRONCHIAL ULTRASOUND N/A 04/18/2019   Procedure: VIDEO BRONCHOSCOPY WITH ENDOBRONCHIAL ULTRASOUND;  Surgeon: Grace Isaac, MD;  Location: [facility];  Service: Thoracic;  Laterality: N/A;    Family History  Problem Relation Age of Onset   Breast cancer Mother    Anuerysm Mother        BRAIN   Cancer Mother        BREAST   COPD Father    Hyperlipidemia Father    Congestive Heart Failure Father    Bipolar disorder Sister    Diabetes Sister    Fibromyalgia Sister    Cancer Paternal Uncle        LUNG   Breast cancer Maternal Grandmother    Cancer Maternal  Grandmother 71       BREAST   Cancer Paternal Grandfather        LUNG    Social History   Socioeconomic History   Marital status: Divorced    Spouse name: Not on file   Number of children: Not on file   Years of education: Not on file   Highest education level: Not on file  Occupational History   Not on file  Tobacco Use   Smoking status: Never   Smokeless tobacco: Never  Vaping Use   Vaping Use: Never used  Substance and Sexual Activity   Alcohol use: Yes    Comment: VERY RARE OCCASION   Drug use: Never   Sexual activity: Not on file  Other Topics Concern   Not on file  Social History Narrative   Not on file   Social Determinants of Health   Financial Resource Strain: Not on file  Food Insecurity: Not on file  Transportation Needs: Not on file  Physical Activity: Not on file  Stress: Not on file  Social Connections: Not on file  Intimate Partner Violence: Not on file     Current Outpatient Medications:  albuterol (VENTOLIN HFA) 108 (90 Base) MCG/ACT inhaler, Inhale 2 puffs into the lungs every 6 (six) hours as needed for wheezing or shortness of breath., Disp: 18 g, Rfl: 2   azithromycin (ZITHROMAX) 250 MG tablet, Take 2 tablets on day 1, then 1 tablet daily on days 2 through 5, Disp: 6 tablet, Rfl: 0   budesonide-formoterol (SYMBICORT) 160-4.5 MCG/ACT inhaler, Inhale 2 puffs into the lungs 2 (two) times daily., Disp: 1 each, Rfl: 3   busPIRone (BUSPAR) 7.5 MG tablet, Take 1 tablet (7.5 mg total) by mouth 2 (two) times daily., Disp: 60 tablet, Rfl: 0   clonazePAM (KLONOPIN) 0.5 MG tablet, Take 1 tablet (0.5 mg total) by mouth 2 (two) times daily as needed for anxiety., Disp: 10 tablet, Rfl: 1   cyclobenzaprine (FLEXERIL) 5 MG tablet, Take 1 tablet (5 mg total) by mouth 3 (three) times daily as needed for muscle spasms., Disp: 60 tablet, Rfl: 1   famotidine (PEPCID) 40 MG tablet, TAKE 1 TABLET BY MOUTH TWICE A DAY, Disp: 180 tablet, Rfl: 1   fluticasone (FLONASE)  50 MCG/ACT nasal spray, Place 2 sprays into both nostrils daily., Disp: 16 g, Rfl: 6   gabapentin (NEURONTIN) 300 MG capsule, Take 2 capsules (600 mg total) by mouth 2 (two) times daily., Disp: 360 capsule, Rfl: 0   irbesartan (AVAPRO) 75 MG tablet, TAKE 1 TABLET BY MOUTH EVERY DAY, Disp: 90 tablet, Rfl: 0   levothyroxine (SYNTHROID) 25 MCG tablet, TAKE 1 TABLET BY MOUTH EVERY DAY BEFORE BREAKFAST, Disp: 90 tablet, Rfl: 0   metFORMIN (GLUCOPHAGE) 1000 MG tablet, TAKE 1 TABLET (1,000 MG TOTAL) BY MOUTH 2 (TWO) TIMES DAILY WITH A MEAL. PLEASE CALL FOR A FASTING FOLLOW UP APPOINTMENT. THANK YOU, DR. COX, Disp: 180 tablet, Rfl: 2   predniSONE (DELTASONE) 20 MG tablet, Take 3 tablets (60 mg total) by mouth daily with breakfast for 3 days, THEN 2 tablets (40 mg total) daily with breakfast for 3 days, THEN 1 tablet (20 mg total) daily with breakfast for 3 days., Disp: 18 tablet, Rfl: 0   promethazine-dextromethorphan (PROMETHAZINE-DM) 6.25-15 MG/5ML syrup, Take 5 mLs by mouth 4 (four) times daily as needed., Disp: 118 mL, Rfl: 0   rosuvastatin (CRESTOR) 20 MG tablet, TAKE 1 TABLET BY MOUTH EVERY DAY, Disp: 90 tablet, Rfl: 1   Semaglutide (RYBELSUS) 7 MG TABS, Take 1 tablet (7 mg total) by mouth daily., Disp: 90 tablet, Rfl: 0   SODIUM FLUORIDE 5000 PPM 1.1 % PSTE, See admin instructions., Disp: , Rfl:    traZODone (DESYREL) 150 MG tablet, TAKE 1 TABLET BY MOUTH EVERYDAY AT BEDTIME, Disp: 90 tablet, Rfl: 0   venlafaxine XR (EFFEXOR-XR) 75 MG 24 hr capsule, TAKE 3 CAPSULES BY MOUTH DAILY IN THE MORNING, Disp: 270 capsule, Rfl: 1   loratadine (CLARITIN) 10 MG tablet, TAKE 1 TABLET BY MOUTH EVERY DAY (Patient not taking: Reported on 12/01/2022), Disp: 90 tablet, Rfl: 0   meloxicam (MOBIC) 15 MG tablet, TAKE 1 TABLET (15 MG TOTAL) BY MOUTH DAILY. (Patient not taking: Reported on 12/01/2022), Disp: 30 tablet, Rfl: 0   zolpidem (AMBIEN CR) 12.5 MG CR tablet, Take 1 tablet (12.5 mg total) by mouth at bedtime as needed  for sleep. (Patient not taking: Reported on 12/01/2022), Disp: 30 tablet, Rfl: 0   Allergies  Allergen Reactions   Moxifloxacin Anaphylaxis, Other (See Comments) and Hives   Avelox [Moxifloxacin Hcl In Nacl]    Cymbalta [Duloxetine Hcl]    Duloxetine Hives  Oxycodone-Acetaminophen Hives    CONSTITUTIONAL: has had some mild malaise E/N/T: see HPI CARDIOVASCULAR: Negative for chest pain, dizziness, RESPIRATORY: see HPI GASTROINTESTINAL: has had some mild nausea         Objective:    PHYSICAL EXAM:   VS: BP 130/70   Pulse 85   Temp 98.1 F (36.7 C)   Resp 15   Ht 5' 10"$  (1.778 m)   Wt (!) 307 lb (139.3 kg)   SpO2 94%   BMI 44.05 kg/m   GEN: Well nourished, well developed, in no acute distress  HEENT: normal external ears and nose - normal external auditory canals and TMS -- Lips, Teeth and Gums - normal  Oropharynx - erythema/pnd Cardiac: RRR; no murmurs, Respiratory:  faint scattered wheezes noted -   Office Visit on 12/01/2022  Component Date Value Ref Range Status   SARS Coronavirus 2 Ag 12/01/2022 Negative  Negative Final   Influenza A, POC 12/01/2022 Negative  Negative Final   Influenza B, POC 12/01/2022 Negative  Negative Final      Wt Readings from Last 3 Encounters:  12/01/22 (!) 307 lb (139.3 kg)  11/09/22 (!) 306 lb (138.8 kg)  09/05/22 (!) 303 lb (137.4 kg)    Health Maintenance Due  Topic Date Due   OPHTHALMOLOGY EXAM  Never done   HIV Screening  Never done   Hepatitis C Screening  Never done   Zoster Vaccines- Shingrix (2 of 2) 08/05/2021   Diabetic kidney evaluation - Urine ACR  12/22/2022    There are no preventive care reminders to display for this patient.        Assessment & Plan:   Problem List Items Addressed This Visit       Respiratory   Acute upper respiratory infection - Primary   Relevant Medications   azithromycin (ZITHROMAX) 250 MG tablet   predniSONE (DELTASONE) 20 MG tablet   promethazine-dextromethorphan  (PROMETHAZINE-DM) 6.25-15 MG/5ML syrup   Other Relevant Orders   POC COVID-19 (Completed)   Influenza A/B (Completed)     Other   Bronchospasm   Relevant Medications   predniSONE (DELTASONE) 20 MG tablet     Meds ordered this encounter  Medications   azithromycin (ZITHROMAX) 250 MG tablet    Sig: Take 2 tablets on day 1, then 1 tablet daily on days 2 through 5    Dispense:  6 tablet    Refill:  0    Order Specific Question:   Supervising Provider    Answer:   Shelton Silvas   predniSONE (DELTASONE) 20 MG tablet    Sig: Take 3 tablets (60 mg total) by mouth daily with breakfast for 3 days, THEN 2 tablets (40 mg total) daily with breakfast for 3 days, THEN 1 tablet (20 mg total) daily with breakfast for 3 days.    Dispense:  18 tablet    Refill:  0    Order Specific Question:   Supervising Provider    Answer:   Shelton Silvas   promethazine-dextromethorphan (PROMETHAZINE-DM) 6.25-15 MG/5ML syrup    Sig: Take 5 mLs by mouth 4 (four) times daily as needed.    Dispense:  118 mL    Refill:  0    Order Specific Question:   Supervising Provider    Answer:   COX, KIRSTEN [id number]     [place], PA-C

## 2022-12-04 ENCOUNTER — Other Ambulatory Visit: Payer: Self-pay | Admitting: Family Medicine

## 2022-12-04 DIAGNOSIS — F332 Major depressive disorder, recurrent severe without psychotic features: Secondary | ICD-10-CM

## 2022-12-06 ENCOUNTER — Encounter: Payer: Self-pay | Admitting: Physician Assistant

## 2022-12-08 ENCOUNTER — Ambulatory Visit: Payer: BC Managed Care – PPO | Admitting: Family Medicine

## 2022-12-08 ENCOUNTER — Encounter: Payer: Self-pay | Admitting: Family Medicine

## 2022-12-08 VITALS — BP 124/80 | HR 84 | Temp 97.5°F | Ht 70.5 in | Wt 303.0 lb

## 2022-12-08 DIAGNOSIS — F99 Mental disorder, not otherwise specified: Secondary | ICD-10-CM

## 2022-12-08 DIAGNOSIS — E559 Vitamin D deficiency, unspecified: Secondary | ICD-10-CM

## 2022-12-08 DIAGNOSIS — J0101 Acute recurrent maxillary sinusitis: Secondary | ICD-10-CM

## 2022-12-08 DIAGNOSIS — F331 Major depressive disorder, recurrent, moderate: Secondary | ICD-10-CM

## 2022-12-08 DIAGNOSIS — E1142 Type 2 diabetes mellitus with diabetic polyneuropathy: Secondary | ICD-10-CM

## 2022-12-08 DIAGNOSIS — E039 Hypothyroidism, unspecified: Secondary | ICD-10-CM | POA: Diagnosis not present

## 2022-12-08 DIAGNOSIS — E782 Mixed hyperlipidemia: Secondary | ICD-10-CM

## 2022-12-08 DIAGNOSIS — F5105 Insomnia due to other mental disorder: Secondary | ICD-10-CM

## 2022-12-08 DIAGNOSIS — I10 Essential (primary) hypertension: Secondary | ICD-10-CM | POA: Diagnosis not present

## 2022-12-08 MED ORDER — AMOXICILLIN-POT CLAVULANATE 875-125 MG PO TABS: 1.0000 | ORAL_TABLET | Freq: Two times a day (BID) | ORAL | 0 refills | Status: DC

## 2022-12-08 MED ORDER — BUPROPION HCL ER (XL) 150 MG PO TB24: 150.0000 mg | ORAL_TABLET | Freq: Every day | ORAL | 0 refills | Status: DC

## 2022-12-08 NOTE — Assessment & Plan Note (Signed)
well controlled Continue Synthroid at current dose

## 2022-12-08 NOTE — Progress Notes (Unsigned)
Subjective:  Patient ID: Holly [name], female    DOB: May 04, 1966  Age: 57 y.o. MRN: [id number]  Chief Complaint  Patient presents with   Diabetes   Hypertension   Hyperlipidemia   Hypothyroidism    HPI DM II: Taking metformin 1000 mg BID, rybelsus 7 mg daily checking blood sugars periodically ranges have been 120-280, checks feet daily, overdue for eye appt, takes gabapentin for neuropathy prn.  Hypertension: takes Avapro 75 mg daily, does not exercise regularly  Hyperlipidemia: Taking crestor 20 mg daily   OSA: does not have CPAP machine  Anxiety/Depression: Buspar and Klonopin, effexor 75 mg 3 daily  GERD: nothing was taking pepcid  Hypothyroidism: synthroid 25 mcg daily in am   Insomnia: taking trazodone. Ambien caused drowsiness the following morning.     12/08/2022   11:39 AM 11/09/2022    2:37 PM 09/05/2022   10:37 AM 02/03/2021    7:50 AM 08/02/2020   10:26 AM  Depression screen PHQ 2/9  Decreased Interest 3 3 1 1 2  $ Down, Depressed, Hopeless 2 3 2 1 2  $ PHQ - 2 Score 5 6 3 2 4  $ Altered sleeping 1 3 3 2 3  $ Tired, decreased energy 2 3 3 3 3  $ Change in appetite 2 3 3 2 3  $ Feeling bad or failure about yourself  0 0 2  2  Trouble concentrating 2 2 1 3 3  $ Moving slowly or fidgety/restless 2 0 0 0 1  Suicidal thoughts  0 0 0 0  PHQ-9 Score 14 17 15 12 19  $ Difficult doing work/chores Very difficult Extremely dIfficult Somewhat difficult Very difficult          04/18/2019    5:59 AM 10/04/2019    5:19 PM 02/03/2021    7:46 AM 12/21/2021    9:43 AM 12/08/2022   11:39 AM  Fall Risk  Falls in the past year?   0 0 0  Was there an injury with Fall?   0 0 0  Fall Risk Category Calculator   0 0 0  Fall Risk Category (Retired)   Low Low   (RETIRED) Patient Fall Risk Level Moderate fall risk Low fall risk Low fall risk Low fall risk   Patient at Risk for Falls Due to   No Fall Risks No Fall Risks No Fall Risks  Fall risk Follow up   Falls evaluation completed Falls  evaluation completed Falls evaluation completed      Review of Systems  Constitutional:  Negative for chills, fatigue and fever.  HENT:  Positive for congestion, sinus pressure, sinus pain and sneezing. Negative for ear pain, rhinorrhea and sore throat.   Respiratory:  Positive for cough. Negative for shortness of breath.   Cardiovascular:  Negative for chest pain.  Gastrointestinal:  Negative for abdominal pain, constipation, diarrhea, nausea and vomiting.  Genitourinary:  Negative for dysuria and urgency.  Musculoskeletal:  Negative for back pain and myalgias.  Neurological:  Negative for dizziness, weakness, light-headedness and headaches.  Psychiatric/Behavioral:  Positive for dysphoric mood. The patient is not nervous/anxious.     Current Outpatient Medications on File Prior to Visit  Medication Sig Dispense Refill   albuterol (VENTOLIN HFA) 108 (90 Base) MCG/ACT inhaler Inhale 2 puffs into the lungs every 6 (six) hours as needed for wheezing or shortness of breath. 18 g 2   budesonide-formoterol (SYMBICORT) 160-4.5 MCG/ACT inhaler Inhale 2 puffs into the lungs 2 (two) times daily. 1 each 3  busPIRone (BUSPAR) 7.5 MG tablet TAKE 1 TABLET BY MOUTH 2 TIMES DAILY. 180 tablet 1   clonazePAM (KLONOPIN) 0.5 MG tablet Take 1 tablet (0.5 mg total) by mouth 2 (two) times daily as needed for anxiety. 10 tablet 1   cyclobenzaprine (FLEXERIL) 5 MG tablet Take 1 tablet (5 mg total) by mouth 3 (three) times daily as needed for muscle spasms. 60 tablet 1   famotidine (PEPCID) 40 MG tablet TAKE 1 TABLET BY MOUTH TWICE A DAY 180 tablet 1   fluticasone (FLONASE) 50 MCG/ACT nasal spray Place 2 sprays into both nostrils daily. 16 g 6   gabapentin (NEURONTIN) 300 MG capsule Take 2 capsules (600 mg total) by mouth 2 (two) times daily. 360 capsule 0   irbesartan (AVAPRO) 75 MG tablet TAKE 1 TABLET BY MOUTH EVERY DAY 90 tablet 0   levothyroxine (SYNTHROID) 25 MCG tablet TAKE 1 TABLET BY MOUTH EVERY DAY  BEFORE BREAKFAST 90 tablet 0   loratadine (CLARITIN) 10 MG tablet TAKE 1 TABLET BY MOUTH EVERY DAY (Patient not taking: Reported on 12/01/2022) 90 tablet 0   meloxicam (MOBIC) 15 MG tablet TAKE 1 TABLET (15 MG TOTAL) BY MOUTH DAILY. (Patient not taking: Reported on 12/01/2022) 30 tablet 0   metFORMIN (GLUCOPHAGE) 1000 MG tablet TAKE 1 TABLET (1,000 MG TOTAL) BY MOUTH 2 (TWO) TIMES DAILY WITH A MEAL. PLEASE CALL FOR A FASTING FOLLOW UP APPOINTMENT. THANK YOU, DR. Adali Pennings 180 tablet 2   predniSONE (DELTASONE) 20 MG tablet Take 3 tablets (60 mg total) by mouth daily with breakfast for 3 days, THEN 2 tablets (40 mg total) daily with breakfast for 3 days, THEN 1 tablet (20 mg total) daily with breakfast for 3 days. 18 tablet 0   promethazine-dextromethorphan (PROMETHAZINE-DM) 6.25-15 MG/5ML syrup Take 5 mLs by mouth 4 (four) times daily as needed. 118 mL 0   rosuvastatin (CRESTOR) 20 MG tablet TAKE 1 TABLET BY MOUTH EVERY DAY 90 tablet 1   Semaglutide (RYBELSUS) 7 MG TABS Take 1 tablet (7 mg total) by mouth daily. 90 tablet 0   SODIUM FLUORIDE 5000 PPM 1.1 % PSTE See admin instructions.     traZODone (DESYREL) 150 MG tablet TAKE 1 TABLET BY MOUTH EVERYDAY AT BEDTIME 90 tablet 0   venlafaxine XR (EFFEXOR-XR) 75 MG 24 hr capsule TAKE 3 CAPSULES BY MOUTH DAILY IN THE MORNING 270 capsule 1   No current facility-administered medications on file prior to visit.   Past Medical History:  Diagnosis Date   Anxiety    COVID-19 virus detected 12/30/2019   10/30/2019-SARS-CoV-2-positive Didn't require hospitalization     Depression    Diabetes mellitus without complication ([place])    Fatty liver    Goiter    Hashimoto's thyroiditis    Hilar adenopathy 03/20/19  04/11/19   CTA CHEST and PET per DR. Lauretta Chester [facility]   History of kidney stones    Hypertension    Mediastinal adenopathy    PER CTA CHEST PET SCAN per DR. Beryle Lathe [facility]   Pneumonia    Supraclavicular adenopathy 03/20/2019   PER CTA CHEST and PET per DR. Renelda Mom  [facility]   Past Surgical History:  Procedure Laterality Date   ABDOMINAL HYSTERECTOMY     COMPLETE   ADHESIOLYSIS     APPENDECTOMY     CESAREAN SECTION     MEDIASTINOSCOPY N/A 04/18/2019   Procedure: MEDIASTINOSCOPY;  Surgeon: Grace Isaac, MD;  Location: [facility];  Service: Thoracic;  Laterality: N/A;   VIDEO BRONCHOSCOPY WITH  ENDOBRONCHIAL ULTRASOUND N/A 04/18/2019   Procedure: VIDEO BRONCHOSCOPY WITH ENDOBRONCHIAL ULTRASOUND;  Surgeon: Grace Isaac, MD;  Location: [facility];  Service: Thoracic;  Laterality: N/A;    Family History  Problem Relation Age of Onset   Breast cancer Mother    Anuerysm Mother        BRAIN   Cancer Mother        BREAST   COPD Father    Hyperlipidemia Father    Congestive Heart Failure Father    Bipolar disorder Sister    Diabetes Sister    Fibromyalgia Sister    Cancer Paternal Uncle        LUNG   Breast cancer Maternal Grandmother    Cancer Maternal Grandmother 4       BREAST   Cancer Paternal Grandfather        LUNG   Social History   Socioeconomic History   Marital status: Divorced    Spouse name: Not on file   Number of children: Not on file   Years of education: Not on file   Highest education level: Not on file  Occupational History   Not on file  Tobacco Use   Smoking status: Never   Smokeless tobacco: Never  Vaping Use   Vaping Use: Never used  Substance and Sexual Activity   Alcohol use: Yes    Comment: VERY RARE OCCASION   Drug use: Never   Sexual activity: Not on file  Other Topics Concern   Not on file  Social History Narrative   Not on file   Social Determinants of Health   Financial Resource Strain: Low Risk  (12/08/2022)   Overall Financial Resource Strain (CARDIA)    Difficulty of Paying Living Expenses: Not hard at all  Food Insecurity: No Food Insecurity (12/08/2022)   Hunger Vital Sign    Worried About Running Out of Food in the Last Year: Never true    Ran Out of Food in the Last Year: Never true   Transportation Needs: No Transportation Needs (12/08/2022)   PRAPARE - Hydrologist (Medical): No    Lack of Transportation (Non-Medical): No  Physical Activity: Inactive (12/08/2022)   Exercise Vital Sign    Days of Exercise per Week: 0 days    Minutes of Exercise per Session: 0 min  Stress: No Stress Concern Present (12/08/2022)   [place]    Feeling of Stress : Not at all  Social Connections: Moderately Isolated (12/08/2022)   Social Connection and Isolation Panel [NHANES]    Frequency of Communication with Friends and Family: More than three times a week    Frequency of Social Gatherings with Friends and Family: Once a week    Attends Religious Services: More than 4 times per year    Active Member of [employer] or Organizations: No    Attends Music therapist: Never    Marital Status: Divorced    Objective:  BP 124/80   Pulse 84   Temp (!) 97.5 F (36.4 C)   Ht 5' 10.5" (1.791 m)   Wt (!) 303 lb (137.4 kg)   SpO2 98%   BMI 42.86 kg/m      12/08/2022   11:32 AM 12/01/2022   10:19 AM 11/09/2022    2:32 PM  BP/Weight  Systolic BP [id number]  Diastolic BP 80 70 70  Wt. (Lbs) 303 307 306  BMI 42.86 kg/m2  44.05 kg/m2 43.91 kg/m2    Physical Exam Vitals reviewed.  Constitutional:      Appearance: Normal appearance. She is normal weight.  Neck:     Vascular: No carotid bruit.  Cardiovascular:     Rate and Rhythm: Normal rate and regular rhythm.     Heart sounds: Normal heart sounds.  Pulmonary:     Effort: Pulmonary effort is normal. No respiratory distress.     Breath sounds: Normal breath sounds.  Abdominal:     General: Abdomen is flat. Bowel sounds are normal.     Palpations: Abdomen is soft.     Tenderness: There is no abdominal tenderness.  Neurological:     Mental Status: She is alert and oriented to person, place, and time.  Psychiatric:        Mood and  Affect: Mood normal.        Behavior: Behavior normal.     Diabetic Foot Exam - Simple   No data filed      Lab Results  Component Value Date   WBC 7.8 09/05/2022   HGB 15.4 09/05/2022   HCT 45.8 09/05/2022   PLT 282 09/05/2022   GLUCOSE 217 (H) 09/05/2022   CHOL 156 12/21/2021   TRIG 191 (H) 12/21/2021   HDL 49 12/21/2021   LDLCALC 75 12/21/2021   ALT 21 09/05/2022   AST 19 09/05/2022   NA 138 09/05/2022   K 4.5 09/05/2022   CL 101 09/05/2022   CREATININE 0.73 09/05/2022   BUN 13 09/05/2022   CO2 23 09/05/2022   TSH 1.740 09/05/2022   INR 1.0 04/16/2019   HGBA1C 7.1 (H) 09/05/2022   MICROALBUR 80 08/15/2020      Assessment & Plan:    Primary hypertension Assessment & Plan: Well controlled.  No changes to medicines. Irbesartan 75 mg once daily.  Continue to work on eating a healthy diet and exercise.  Labs drawn today.     Hypothyroidism (acquired) Assessment & Plan: well controlled Continue Synthroid at current dose    Insomnia due to other mental disorder Assessment & Plan: Continue trazodone   Diabetic polyneuropathy associated with type 2 diabetes mellitus ([place]) Assessment & Plan: Control: Fair Recommend check sugars fasting daily. Recommend check feet daily. Recommend annual eye exams. Medicines:  Metformin 1000 mg BID, semaglutide 7 mg daily Continue to work on eating a healthy diet and exercise.  Labs drawn today.     Mixed hyperlipidemia Assessment & Plan: Well controlled.  No medication changes recommended. Continue healthy diet and exercise.        No orders of the defined types were placed in this encounter.   No orders of the defined types were placed in this encounter.    Follow-up: No follow-ups on file.   I,Katherina A Bramblett,acting as a scribe for Rochel Brome, MD.,have documented all relevant documentation on the behalf of Rochel Brome, MD,as directed by  Rochel Brome, MD while in the presence of Rochel Brome,  MD.   An After Visit Summary was printed and given to the patient.  Rochel Brome, MD [facility] [phone]

## 2022-12-08 NOTE — Assessment & Plan Note (Signed)
Well controlled.  No medication changes recommended. Continue healthy diet and exercise.  

## 2022-12-08 NOTE — Assessment & Plan Note (Signed)
Control: Fair Recommend check sugars fasting daily. Recommend check feet daily. Recommend annual eye exams. Medicines:  Metformin 1000 mg BID, semaglutide 7 mg daily Continue to work on eating a healthy diet and exercise.  Labs drawn today.

## 2022-12-08 NOTE — Assessment & Plan Note (Addendum)
Continue trazodone 

## 2022-12-08 NOTE — Assessment & Plan Note (Signed)
Well controlled.  No changes to medicines. Irbesartan 75 mg once daily.  Continue to work on eating a healthy diet and exercise.  Labs drawn today.

## 2022-12-09 DIAGNOSIS — E559 Vitamin D deficiency, unspecified: Secondary | ICD-10-CM

## 2022-12-09 HISTORY — DX: Vitamin D deficiency, unspecified: E55.9

## 2022-12-10 LAB — CBC WITH DIFFERENTIAL/PLATELET
Basophils Absolute: 0.1 10*3/uL (ref 0.0–0.2)
Basos: 0 %
EOS (ABSOLUTE): 0.1 10*3/uL (ref 0.0–0.4)
Eos: 0 %
Hematocrit: 48 % — ABNORMAL HIGH (ref 34.0–46.6)
Hemoglobin: 16.2 g/dL — ABNORMAL HIGH (ref 11.1–15.9)
Immature Grans (Abs): 0.1 10*3/uL (ref 0.0–0.1)
Immature Granulocytes: 1 %
Lymphocytes Absolute: 3.6 10*3/uL — ABNORMAL HIGH (ref 0.7–3.1)
Lymphs: 22 %
MCH: 30.6 pg (ref 26.6–33.0)
MCHC: 33.8 g/dL (ref 31.5–35.7)
MCV: 91 fL (ref 79–97)
Monocytes Absolute: 0.8 10*3/uL (ref 0.1–0.9)
Monocytes: 5 %
Neutrophils Absolute: 11.5 10*3/uL — ABNORMAL HIGH (ref 1.4–7.0)
Neutrophils: 72 %
Platelets: 318 10*3/uL (ref 150–450)
RBC: 5.3 x10E6/uL — ABNORMAL HIGH (ref 3.77–5.28)
RDW: 11.9 % (ref 11.7–15.4)
WBC: 16.1 10*3/uL — ABNORMAL HIGH (ref 3.4–10.8)

## 2022-12-10 LAB — COMPREHENSIVE METABOLIC PANEL
ALT: 36 IU/L — ABNORMAL HIGH (ref 0–32)
AST: 21 IU/L (ref 0–40)
Albumin/Globulin Ratio: 1.7 (ref 1.2–2.2)
Albumin: 4.7 g/dL (ref 3.8–4.9)
Alkaline Phosphatase: 112 IU/L (ref 44–121)
BUN/Creatinine Ratio: 17 (ref 9–23)
BUN: 12 mg/dL (ref 6–24)
Bilirubin Total: 0.5 mg/dL (ref 0.0–1.2)
CO2: 24 mmol/L (ref 20–29)
Calcium: 9.4 mg/dL (ref 8.7–10.2)
Chloride: 96 mmol/L (ref 96–106)
Creatinine, Ser: 0.7 mg/dL (ref 0.57–1.00)
Globulin, Total: 2.8 g/dL (ref 1.5–4.5)
Glucose: 111 mg/dL — ABNORMAL HIGH (ref 70–99)
Potassium: 4.9 mmol/L (ref 3.5–5.2)
Sodium: 139 mmol/L (ref 134–144)
Total Protein: 7.5 g/dL (ref 6.0–8.5)
eGFR: 101 mL/min/{1.73_m2} (ref >59)

## 2022-12-10 LAB — CARDIOVASCULAR RISK ASSESSMENT

## 2022-12-10 LAB — MICROALBUMIN / CREATININE URINE RATIO
Creatinine, Urine: 82.8 mg/dL
Microalb/Creat Ratio: 5 mg/g creat (ref 0–29)
Microalbumin, Urine: 3.8 ug/mL

## 2022-12-10 LAB — LIPID PANEL
Chol/HDL Ratio: 2.9 ratio (ref 0.0–4.4)
Cholesterol, Total: 168 mg/dL (ref 100–199)
HDL: 57 mg/dL (ref >39)
LDL Chol Calc (NIH): 79 mg/dL (ref 0–99)
Triglycerides: 191 mg/dL — ABNORMAL HIGH (ref 0–149)
VLDL Cholesterol Cal: 32 mg/dL (ref 5–40)

## 2022-12-10 LAB — VITAMIN D 25 HYDROXY (VIT D DEFICIENCY, FRACTURES): Vit D, 25-Hydroxy: 29.6 ng/mL — ABNORMAL LOW (ref 30.0–100.0)

## 2022-12-10 LAB — HEMOGLOBIN A1C
Est. average glucose Bld gHb Est-mCnc: 192 mg/dL
Hgb A1c MFr Bld: 8.3 % — ABNORMAL HIGH (ref 4.8–5.6)

## 2022-12-10 NOTE — Assessment & Plan Note (Signed)
Continue effexor xr 75 mg 3 in am.  Start wellbutrin xl 150 mg once in am.

## 2022-12-10 NOTE — Assessment & Plan Note (Signed)
Start augmentin 

## 2022-12-17 ENCOUNTER — Other Ambulatory Visit: Payer: Self-pay | Admitting: Family Medicine

## 2022-12-22 ENCOUNTER — Ambulatory Visit: Payer: BC Managed Care – PPO

## 2022-12-25 ENCOUNTER — Ambulatory Visit: Payer: BC Managed Care – PPO

## 2022-12-25 ENCOUNTER — Encounter: Payer: Self-pay | Admitting: Family Medicine

## 2022-12-25 ENCOUNTER — Other Ambulatory Visit: Payer: Self-pay | Admitting: Family Medicine

## 2022-12-25 MED ORDER — FLUCONAZOLE 150 MG PO TABS: 150.0000 mg | ORAL_TABLET | Freq: Every day | ORAL | 1 refills | Status: DC

## 2022-12-29 ENCOUNTER — Other Ambulatory Visit: Payer: Self-pay

## 2022-12-29 DIAGNOSIS — R7989 Other specified abnormal findings of blood chemistry: Secondary | ICD-10-CM

## 2022-12-29 MED ORDER — ICOSAPENT ETHYL 1 G PO CAPS: 2.0000 g | ORAL_CAPSULE | Freq: Two times a day (BID) | ORAL | 2 refills | Status: DC

## 2022-12-29 MED ORDER — VITAMIN D (ERGOCALCIFEROL) 1.25 MG (50000 UNIT) PO CAPS: 50000.0000 [IU] | ORAL_CAPSULE | ORAL | 2 refills | Status: DC

## 2022-12-31 ENCOUNTER — Other Ambulatory Visit: Payer: Self-pay

## 2022-12-31 DIAGNOSIS — R7989 Other specified abnormal findings of blood chemistry: Secondary | ICD-10-CM

## 2023-01-01 ENCOUNTER — Other Ambulatory Visit: Payer: BC Managed Care – PPO

## 2023-01-01 ENCOUNTER — Telehealth: Payer: Self-pay

## 2023-01-01 NOTE — Telephone Encounter (Signed)
PA submitted and approved via covermymeds for vascepa. 

## 2023-01-09 ENCOUNTER — Ambulatory Visit (INDEPENDENT_AMBULATORY_CARE_PROVIDER_SITE_OTHER): Payer: BC Managed Care – PPO | Admitting: Physician Assistant

## 2023-01-09 ENCOUNTER — Encounter: Payer: Self-pay | Admitting: Physician Assistant

## 2023-01-09 VITALS — BP 130/80 | HR 84 | Temp 97.2°F | Ht 70.5 in | Wt 303.4 lb

## 2023-01-09 DIAGNOSIS — R1084 Generalized abdominal pain: Secondary | ICD-10-CM | POA: Diagnosis not present

## 2023-01-09 DIAGNOSIS — R1314 Dysphagia, pharyngoesophageal phase: Secondary | ICD-10-CM

## 2023-01-09 DIAGNOSIS — R1011 Right upper quadrant pain: Secondary | ICD-10-CM | POA: Insufficient documentation

## 2023-01-09 DIAGNOSIS — R110 Nausea: Secondary | ICD-10-CM | POA: Diagnosis not present

## 2023-01-09 DIAGNOSIS — Z1231 Encounter for screening mammogram for malignant neoplasm of breast: Secondary | ICD-10-CM

## 2023-01-09 HISTORY — DX: Generalized abdominal pain: R10.84

## 2023-01-09 HISTORY — DX: Dysphagia, pharyngoesophageal phase: R13.14

## 2023-01-09 LAB — POCT URINALYSIS DIPSTICK
Bilirubin, UA: NEGATIVE
Blood, UA: NEGATIVE
Glucose, UA: NEGATIVE
Ketones, UA: NEGATIVE
Leukocytes, UA: NEGATIVE
Nitrite, UA: NEGATIVE
Protein, UA: NEGATIVE
Spec Grav, UA: 1.02 (ref 1.010–1.025)
Urobilinogen, UA: 0.2 E.U./dL
pH, UA: 6 (ref 5.0–8.0)

## 2023-01-09 MED ORDER — FAMOTIDINE 40 MG PO TABS: 40.0000 mg | ORAL_TABLET | Freq: Two times a day (BID) | ORAL | 1 refills | Status: DC

## 2023-01-09 NOTE — Progress Notes (Signed)
Acute Office Visit  Subjective:    Patient ID: Holly [name], female    DOB: May 13, 1966, 57 y.o.   MRN: [id number]  Chief Complaint  Patient presents with   Nausea   Abdominal Pain    Right side    HPI Patient is in today for complaints of abdominal pain and nausea for the past few weeks.  Says most of the pain is in the RUQ - she states she has had loose bowels and also that after eating all of her symptoms are worse.  She states that fatty foods do make symptoms worse as well. She denies urinary or vaginal symptoms.  Denies vomiting She requests refill of pepcid - has not been taking this medication She also states separate from the above issue she has noted trouble swallowing food and pills over the past several weeks  Pt would like to schedule screening mammogram Past Medical History:  Diagnosis Date   Anxiety    COVID-19 virus detected 12/30/2019   10/30/2019-SARS-CoV-2-positive Didn't require hospitalization     Depression    Diabetes mellitus without complication ([place])    Fatty liver    Goiter    Hashimoto's thyroiditis    Hilar adenopathy 03/20/19  04/11/19   CTA CHEST and PET per DR. Lauretta Chester [facility]   History of kidney stones    Hypertension    Mediastinal adenopathy    PER CTA CHEST PET SCAN per DR. Beryle Lathe [facility]   Pneumonia    Supraclavicular adenopathy 03/20/2019   PER CTA CHEST and PET per DR. Renelda Mom [facility]    Past Surgical History:  Procedure Laterality Date   ABDOMINAL HYSTERECTOMY     COMPLETE   ADHESIOLYSIS     APPENDECTOMY     CESAREAN SECTION     MEDIASTINOSCOPY N/A 04/18/2019   Procedure: MEDIASTINOSCOPY;  Surgeon: Grace Isaac, MD;  Location: [facility];  Service: Thoracic;  Laterality: N/A;   VIDEO BRONCHOSCOPY WITH ENDOBRONCHIAL ULTRASOUND N/A 04/18/2019   Procedure: VIDEO BRONCHOSCOPY WITH ENDOBRONCHIAL ULTRASOUND;  Surgeon: Grace Isaac, MD;  Location: [facility];  Service: Thoracic;  Laterality: N/A;    Family History  Problem Relation Age of  Onset   Breast cancer Mother    Anuerysm Mother        BRAIN   Cancer Mother        BREAST   COPD Father    Hyperlipidemia Father    Congestive Heart Failure Father    Bipolar disorder Sister    Diabetes Sister    Fibromyalgia Sister    Cancer Paternal Uncle        LUNG   Breast cancer Maternal Grandmother    Cancer Maternal Grandmother 77       BREAST   Cancer Paternal Grandfather        LUNG    Social History   Socioeconomic History   Marital status: Divorced    Spouse name: Not on file   Number of children: Not on file   Years of education: Not on file   Highest education level: Not on file  Occupational History   Not on file  Tobacco Use   Smoking status: Never   Smokeless tobacco: Never  Vaping Use   Vaping Use: Never used  Substance and Sexual Activity   Alcohol use: Yes    Comment: VERY RARE OCCASION   Drug use: Never   Sexual activity: Not on file  Other Topics Concern   Not on file  Social  History Narrative   Not on file   Social Determinants of Health   Financial Resource Strain: Low Risk  (12/08/2022)   Overall Financial Resource Strain (CARDIA)    Difficulty of Paying Living Expenses: Not hard at all  Food Insecurity: No Food Insecurity (12/08/2022)   Hunger Vital Sign    Worried About Running Out of Food in the Last Year: Never true    Ran Out of Food in the Last Year: Never true  Transportation Needs: No Transportation Needs (12/08/2022)   PRAPARE - Hydrologist (Medical): No    Lack of Transportation (Non-Medical): No  Physical Activity: Inactive (12/08/2022)   Exercise Vital Sign    Days of Exercise per Week: 0 days    Minutes of Exercise per Session: 0 min  Stress: No Stress Concern Present (12/08/2022)   [place]    Feeling of Stress : Not at all  Social Connections: Moderately Isolated (12/08/2022)   Social Connection and Isolation Panel  [NHANES]    Frequency of Communication with Friends and Family: More than three times a week    Frequency of Social Gatherings with Friends and Family: Once a week    Attends Religious Services: More than 4 times per year    Active Member of [employer] or Organizations: No    Attends Archivist Meetings: Never    Marital Status: Divorced  Human resources officer Violence: Not At Risk (12/08/2022)   Humiliation, Afraid, Rape, and Kick questionnaire    Fear of Current or Ex-Partner: No    Emotionally Abused: No    Physically Abused: No    Sexually Abused: No     Current Outpatient Medications:    albuterol (VENTOLIN HFA) 108 (90 Base) MCG/ACT inhaler, Inhale 2 puffs into the lungs every 6 (six) hours as needed for wheezing or shortness of breath., Disp: 18 g, Rfl: 2   budesonide-formoterol (SYMBICORT) 160-4.5 MCG/ACT inhaler, Inhale 2 puffs into the lungs 2 (two) times daily., Disp: 1 each, Rfl: 3   buPROPion (WELLBUTRIN XL) 150 MG 24 hr tablet, Take 1 tablet (150 mg total) by mouth daily., Disp: 90 tablet, Rfl: 0   busPIRone (BUSPAR) 7.5 MG tablet, TAKE 1 TABLET BY MOUTH 2 TIMES DAILY., Disp: 180 tablet, Rfl: 1   clonazePAM (KLONOPIN) 0.5 MG tablet, Take 1 tablet (0.5 mg total) by mouth 2 (two) times daily as needed for anxiety., Disp: 10 tablet, Rfl: 1   cyclobenzaprine (FLEXERIL) 5 MG tablet, Take 1 tablet (5 mg total) by mouth 3 (three) times daily as needed for muscle spasms., Disp: 60 tablet, Rfl: 1   fluticasone (FLONASE) 50 MCG/ACT nasal spray, Place 2 sprays into both nostrils daily., Disp: 16 g, Rfl: 6   gabapentin (NEURONTIN) 300 MG capsule, Take 2 capsules (600 mg total) by mouth 2 (two) times daily., Disp: 360 capsule, Rfl: 0   icosapent Ethyl (VASCEPA) 1 g capsule, Take 2 capsules (2 g total) by mouth 2 (two) times daily., Disp: 120 capsule, Rfl: 2   irbesartan (AVAPRO) 75 MG tablet, TAKE 1 TABLET BY MOUTH EVERY DAY, Disp: 90 tablet, Rfl: 0   levothyroxine (SYNTHROID) 25 MCG  tablet, TAKE 1 TABLET BY MOUTH EVERY DAY BEFORE BREAKFAST, Disp: 90 tablet, Rfl: 0   loratadine (CLARITIN) 10 MG tablet, TAKE 1 TABLET BY MOUTH EVERY DAY, Disp: 90 tablet, Rfl: 0   meloxicam (MOBIC) 15 MG tablet, TAKE 1 TABLET (15 MG TOTAL) BY MOUTH  DAILY., Disp: 30 tablet, Rfl: 0   metFORMIN (GLUCOPHAGE) 1000 MG tablet, TAKE 1 TABLET (1,000 MG TOTAL) BY MOUTH 2 (TWO) TIMES DAILY WITH A MEAL. PLEASE CALL FOR A FASTING FOLLOW UP APPOINTMENT. THANK YOU, DR. COX, Disp: 180 tablet, Rfl: 2   rosuvastatin (CRESTOR) 20 MG tablet, TAKE 1 TABLET BY MOUTH EVERY DAY, Disp: 90 tablet, Rfl: 1   Semaglutide (RYBELSUS) 14 MG TABS, Take 14 mg by mouth daily., Disp: , Rfl:    SODIUM FLUORIDE 5000 PPM 1.1 % PSTE, See admin instructions., Disp: , Rfl:    traZODone (DESYREL) 150 MG tablet, TAKE 1 TABLET BY MOUTH EVERYDAY AT BEDTIME, Disp: 90 tablet, Rfl: 0   venlafaxine XR (EFFEXOR-XR) 75 MG 24 hr capsule, TAKE 3 CAPSULES BY MOUTH DAILY IN THE MORNING, Disp: 270 capsule, Rfl: 1   Vitamin D, Ergocalciferol, (DRISDOL) 1.25 MG (50000 UNIT) CAPS capsule, Take 1 capsule (50,000 Units total) by mouth every 7 (seven) days., Disp: 5 capsule, Rfl: 2   famotidine (PEPCID) 40 MG tablet, Take 1 tablet (40 mg total) by mouth 2 (two) times daily., Disp: 180 tablet, Rfl: 1   Allergies  Allergen Reactions   Moxifloxacin Anaphylaxis, Other (See Comments) and Hives   Avelox [Moxifloxacin Hcl In Nacl]    Cymbalta [Duloxetine Hcl]    Duloxetine Hives   Oxycodone-Acetaminophen Hives    CONSTITUTIONAL: Negative for chills, fatigue, fever,  E/N/T: Negative for ear pain, nasal congestion and sore throat.  CARDIOVASCULAR: Negative for chest pain, dizziness, palpitations and pedal edema.  RESPIRATORY: Negative for recent cough and dyspnea.  GASTROINTESTINAL: see HPI  MSK: Negative for arthralgias and myalgias.  INTEGUMENTARY: Negative for rash.          Objective:    PHYSICAL EXAM:   VS: BP 130/80 (BP Location: Left Arm,  Patient Position: Sitting, Cuff Size: Large)   Pulse 84   Temp (!) 97.2 F (36.2 C) (Temporal)   Ht 5' 10.5" (1.791 m)   Wt (!) 303 lb 6.4 oz (137.6 kg)   SpO2 94%   BMI 42.92 kg/m   GEN: Well nourished, well developed, in no acute distress  Cardiac: RRR; no murmurs, rubs, or gallops,no edema - Respiratory:  normal respiratory rate and pattern with no distress - normal breath sounds with no rales, rhonchi, wheezes or rubs GI: normal bowel sounds, no masses - does have RUQ tenderness  Skin: warm and dry, no rash   Psych: euthymic mood, appropriate affect and demeanor Office Visit on 01/09/2023  Component Date Value Ref Range Status   Color, UA 01/09/2023 Yellow   Final   Clarity, UA 01/09/2023 clear   Final   Glucose, UA 01/09/2023 Negative  Negative Final   Bilirubin, UA 01/09/2023 Negative   Final   Ketones, UA 01/09/2023 Negative   Final   Spec Grav, UA 01/09/2023 1.020  1.010 - 1.025 Final   Blood, UA 01/09/2023 Negative   Final   pH, UA 01/09/2023 6.0  5.0 - 8.0 Final   Protein, UA 01/09/2023 Negative  Negative Final   Urobilinogen, UA 01/09/2023 0.2  0.2 or 1.0 E.U./dL Final   Nitrite, UA 01/09/2023 Negative   Final   Leukocytes, UA 01/09/2023 Negative  Negative Final     Wt Readings from Last 3 Encounters:  01/09/23 (!) 303 lb 6.4 oz (137.6 kg)  12/08/22 (!) 303 lb (137.4 kg)  12/01/22 (!) 307 lb (139.3 kg)    Health Maintenance Due  Topic Date Due   OPHTHALMOLOGY EXAM  Never done   Zoster Vaccines- Shingrix (2 of 2) 08/05/2021    There are no preventive care reminders to display for this patient.        Assessment & Plan:   Problem List Items Addressed This Visit       Digestive   Pharyngoesophageal dysphagia   Relevant Orders   Ambulatory referral to Gastroenterology     Other   Generalized abdominal pain - Primary   Relevant Medications   famotidine (PEPCID) 40 MG tablet   Other Relevant Orders   POCT urinalysis dipstick (Completed)    Nausea   Relevant Orders   Amylase   Lipase   US Abdomen Limited RUQ (LIVER/GB)   RUQ pain   Relevant Orders   CBC with Differential/Platelet   Comprehensive metabolic panel   Amylase   Lipase   US Abdomen Limited RUQ (LIVER/GB)   Other Visit Diagnoses     Visit for screening mammogram       Relevant Orders   MM DIGITAL SCREENING BILATERAL        Meds ordered this encounter  Medications   famotidine (PEPCID) 40 MG tablet    Sig: Take 1 tablet (40 mg total) by mouth 2 (two) times daily.    Dispense:  180 tablet    Refill:  1    Order Specific Question:   Supervising Provider    Answer:   Shelton Silvas   Follow up according to labwork and ruq ultrasound  SARA R Loistine Eberlin, PA-C

## 2023-01-10 LAB — LIPASE: Lipase: 47 U/L (ref 14–72)

## 2023-01-10 LAB — CBC WITH DIFFERENTIAL/PLATELET
Basophils Absolute: 0.1 10*3/uL (ref 0.0–0.2)
Basos: 1 %
EOS (ABSOLUTE): 0.1 10*3/uL (ref 0.0–0.4)
Eos: 1 %
Hematocrit: 42.6 % (ref 34.0–46.6)
Hemoglobin: 14.7 g/dL (ref 11.1–15.9)
Immature Grans (Abs): 0.1 10*3/uL (ref 0.0–0.1)
Immature Granulocytes: 1 %
Lymphocytes Absolute: 2.9 10*3/uL (ref 0.7–3.1)
Lymphs: 26 %
MCH: 31.3 pg (ref 26.6–33.0)
MCHC: 34.5 g/dL (ref 31.5–35.7)
MCV: 91 fL (ref 79–97)
Monocytes Absolute: 0.5 10*3/uL (ref 0.1–0.9)
Monocytes: 5 %
Neutrophils Absolute: 7.3 10*3/uL — ABNORMAL HIGH (ref 1.4–7.0)
Neutrophils: 66 %
Platelets: 276 10*3/uL (ref 150–450)
RBC: 4.69 x10E6/uL (ref 3.77–5.28)
RDW: 11.9 % (ref 11.7–15.4)
WBC: 10.9 10*3/uL — ABNORMAL HIGH (ref 3.4–10.8)

## 2023-01-10 LAB — COMPREHENSIVE METABOLIC PANEL
ALT: 41 IU/L — ABNORMAL HIGH (ref 0–32)
AST: 31 IU/L (ref 0–40)
Albumin/Globulin Ratio: 1.8 (ref 1.2–2.2)
Albumin: 4.3 g/dL (ref 3.8–4.9)
Alkaline Phosphatase: 104 IU/L (ref 44–121)
BUN/Creatinine Ratio: 14 (ref 9–23)
BUN: 10 mg/dL (ref 6–24)
Bilirubin Total: 0.5 mg/dL (ref 0.0–1.2)
CO2: 23 mmol/L (ref 20–29)
Calcium: 9.8 mg/dL (ref 8.7–10.2)
Chloride: 99 mmol/L (ref 96–106)
Creatinine, Ser: 0.73 mg/dL (ref 0.57–1.00)
Globulin, Total: 2.4 g/dL (ref 1.5–4.5)
Glucose: 102 mg/dL — ABNORMAL HIGH (ref 70–99)
Potassium: 4.8 mmol/L (ref 3.5–5.2)
Sodium: 140 mmol/L (ref 134–144)
Total Protein: 6.7 g/dL (ref 6.0–8.5)
eGFR: 96 mL/min/{1.73_m2} (ref >59)

## 2023-01-10 LAB — AMYLASE: Amylase: 49 U/L (ref 31–110)

## 2023-01-20 ENCOUNTER — Other Ambulatory Visit: Payer: Self-pay | Admitting: Family Medicine

## 2023-02-05 ENCOUNTER — Encounter: Payer: Self-pay | Admitting: Family Medicine

## 2023-02-07 ENCOUNTER — Other Ambulatory Visit: Payer: Self-pay | Admitting: Family Medicine

## 2023-02-07 MED ORDER — DAPAGLIFLOZIN PROPANEDIOL 5 MG PO TABS
5.0000 mg | ORAL_TABLET | Freq: Every day | ORAL | 0 refills | Status: DC
Start: 2023-02-07 — End: 2023-03-27

## 2023-02-27 ENCOUNTER — Other Ambulatory Visit: Payer: Self-pay

## 2023-02-27 ENCOUNTER — Other Ambulatory Visit: Payer: Self-pay | Admitting: Family Medicine

## 2023-02-27 DIAGNOSIS — I10 Essential (primary) hypertension: Secondary | ICD-10-CM

## 2023-02-27 MED ORDER — IRBESARTAN 75 MG PO TABS
75.0000 mg | ORAL_TABLET | Freq: Every day | ORAL | 0 refills | Status: DC
Start: 2023-02-27 — End: 2023-05-27

## 2023-03-05 ENCOUNTER — Other Ambulatory Visit: Payer: Self-pay | Admitting: Family Medicine

## 2023-03-05 DIAGNOSIS — F331 Major depressive disorder, recurrent, moderate: Secondary | ICD-10-CM

## 2023-03-06 ENCOUNTER — Other Ambulatory Visit: Payer: Self-pay

## 2023-03-06 DIAGNOSIS — E1142 Type 2 diabetes mellitus with diabetic polyneuropathy: Secondary | ICD-10-CM

## 2023-03-12 ENCOUNTER — Other Ambulatory Visit: Payer: Self-pay

## 2023-03-12 DIAGNOSIS — E1142 Type 2 diabetes mellitus with diabetic polyneuropathy: Secondary | ICD-10-CM

## 2023-03-12 MED ORDER — METFORMIN HCL 1000 MG PO TABS
1000.0000 mg | ORAL_TABLET | Freq: Two times a day (BID) | ORAL | 2 refills | Status: DC
Start: 2023-03-12 — End: 2023-12-04

## 2023-03-15 ENCOUNTER — Ambulatory Visit: Payer: BC Managed Care – PPO | Admitting: Family Medicine

## 2023-03-24 ENCOUNTER — Other Ambulatory Visit: Payer: Self-pay | Admitting: Family Medicine

## 2023-03-26 ENCOUNTER — Other Ambulatory Visit: Payer: Self-pay

## 2023-03-26 MED ORDER — ROSUVASTATIN CALCIUM 20 MG PO TABS: 20.0000 mg | ORAL_TABLET | Freq: Every day | ORAL | 1 refills | Status: DC

## 2023-03-27 ENCOUNTER — Other Ambulatory Visit: Payer: Self-pay | Admitting: Family Medicine

## 2023-03-28 ENCOUNTER — Inpatient Hospital Stay: Admission: RE | Admit: 2023-03-28 | Payer: BC Managed Care – PPO | Source: Ambulatory Visit

## 2023-04-09 ENCOUNTER — Other Ambulatory Visit: Payer: Self-pay

## 2023-04-09 ENCOUNTER — Encounter: Payer: Self-pay | Admitting: Family Medicine

## 2023-04-09 MED ORDER — FLUCONAZOLE 150 MG PO TABS: 150.0000 mg | ORAL_TABLET | Freq: Every day | ORAL | 1 refills | Status: DC

## 2023-04-30 ENCOUNTER — Other Ambulatory Visit: Payer: Self-pay | Admitting: Family Medicine

## 2023-04-30 DIAGNOSIS — K219 Gastro-esophageal reflux disease without esophagitis: Secondary | ICD-10-CM

## 2023-05-02 ENCOUNTER — Ambulatory Visit
Admission: RE | Admit: 2023-05-02 | Discharge: 2023-05-02 | Disposition: A | Discharge status: Home / Self Care | Payer: BC Managed Care – PPO | Source: Ambulatory Visit | Attending: Physician Assistant | Admitting: Physician Assistant

## 2023-05-02 DIAGNOSIS — Z1231 Encounter for screening mammogram for malignant neoplasm of breast: Secondary | ICD-10-CM

## 2023-05-04 ENCOUNTER — Other Ambulatory Visit: Payer: Self-pay

## 2023-05-04 ENCOUNTER — Other Ambulatory Visit: Payer: Self-pay | Admitting: Physician Assistant

## 2023-05-04 DIAGNOSIS — R928 Other abnormal and inconclusive findings on diagnostic imaging of breast: Secondary | ICD-10-CM

## 2023-05-05 ENCOUNTER — Encounter: Payer: Self-pay | Admitting: Family Medicine

## 2023-05-07 ENCOUNTER — Telehealth: Payer: Self-pay

## 2023-05-07 NOTE — Telephone Encounter (Signed)
Ordered as been put in, made patient aware

## 2023-05-07 NOTE — Telephone Encounter (Signed)
The patient stated that she seen her mammogram results online and is wanting to see about getting the diagnostic mammogram and possible ultrasound of the right breast.  Please advise.

## 2023-05-07 NOTE — Telephone Encounter (Signed)
Patient notified that she can login to her MyChart to do a e-visit with a [facility] provider.

## 2023-05-15 ENCOUNTER — Ambulatory Visit: Payer: BC Managed Care – PPO | Admitting: Physician Assistant

## 2023-05-15 ENCOUNTER — Encounter: Payer: Self-pay | Admitting: Physician Assistant

## 2023-05-15 VITALS — BP 120/80 | HR 104 | Temp 97.3°F | Ht 70.5 in | Wt 304.0 lb

## 2023-05-15 DIAGNOSIS — J0100 Acute maxillary sinusitis, unspecified: Secondary | ICD-10-CM | POA: Diagnosis not present

## 2023-05-15 DIAGNOSIS — R051 Acute cough: Secondary | ICD-10-CM | POA: Diagnosis not present

## 2023-05-15 LAB — POC COVID19 BINAXNOW: SARS Coronavirus 2 Ag: NEGATIVE

## 2023-05-15 MED ORDER — PREDNISONE 20 MG PO TABS
ORAL_TABLET | ORAL | 0 refills | Status: AC
Start: 2023-05-15 — End: 2023-05-23

## 2023-05-15 MED ORDER — MOMETASONE FUROATE 50 MCG/ACT NA SUSP
2.0000 | Freq: Every day | NASAL | 12 refills | Status: DC
Start: 2023-05-15 — End: 2024-01-30

## 2023-05-15 MED ORDER — AZITHROMYCIN 250 MG PO TABS: ORAL_TABLET | ORAL | 0 refills | Status: DC

## 2023-05-15 NOTE — Progress Notes (Signed)
Acute Office Visit  Subjective:    Patient ID: Holly [name], female    DOB: 1966-09-26, 57 y.o.   MRN: [id number]  Chief Complaint  Patient presents with   URI    HPI: Patient is in today for URI symptoms c/o facial pain and pressure, headache, congestion, mild dry cough x 1.5 weeks, sore throat BL ear pain L>R. No fever or chills. Has taken sudafed and advil which has helped some. States her symptoms have gotten worse since she called. States she has still been using her Nasonex but it has not helped.   Has appointment on July 30th at 8:50 for a follow up on dense breast tissue discovered on mammogram.   Past Medical History:  Diagnosis Date   Anxiety    COVID-19 virus detected 12/30/2019   10/30/2019-SARS-CoV-2-positive Didn't require hospitalization     Depression    Diabetes mellitus without complication ([facility])    Fatty liver    Goiter    Hashimoto's thyroiditis    Hilar adenopathy 03/20/19  04/11/19   CTA CHEST and PET per DR. Anne Ng [facility]   History of kidney stones    Hypertension    Mediastinal adenopathy    PER CTA CHEST PET SCAN per DR. Lanae Crumbly [facility]   Pneumonia    Supraclavicular adenopathy 03/20/2019   PER CTA CHEST and PET per DR. Sudie Bailey [facility]    Past Surgical History:  Procedure Laterality Date   ABDOMINAL HYSTERECTOMY     COMPLETE   ADHESIOLYSIS     APPENDECTOMY     CESAREAN SECTION     MEDIASTINOSCOPY N/A 04/18/2019   Procedure: MEDIASTINOSCOPY;  Surgeon: Delight Ovens, MD;  Location: [facility];  Service: Thoracic;  Laterality: N/A;   VIDEO BRONCHOSCOPY WITH ENDOBRONCHIAL ULTRASOUND N/A 04/18/2019   Procedure: VIDEO BRONCHOSCOPY WITH ENDOBRONCHIAL ULTRASOUND;  Surgeon: Delight Ovens, MD;  Location: [facility];  Service: Thoracic;  Laterality: N/A;    Family History  Problem Relation Age of Onset   Breast cancer Mother    Anuerysm Mother        BRAIN   Cancer Mother        BREAST   COPD Father    Hyperlipidemia Father    Congestive Heart Failure  Father    Bipolar disorder Sister    Diabetes Sister    Fibromyalgia Sister    Cancer Paternal Uncle        LUNG   Breast cancer Maternal Grandmother    Cancer Maternal Grandmother 38       BREAST   Cancer Paternal Grandfather        LUNG    Social History   Socioeconomic History   Marital status: Divorced    Spouse name: Not on file   Number of children: Not on file   Years of education: Not on file   Highest education level: Not on file  Occupational History   Not on file  Tobacco Use   Smoking status: Never   Smokeless tobacco: Never  Vaping Use   Vaping status: Never Used  Substance and Sexual Activity   Alcohol use: Yes    Comment: VERY RARE OCCASION   Drug use: Never   Sexual activity: Not on file  Other Topics Concern   Not on file  Social History Narrative   Not on file   Social Determinants of Health   Financial Resource Strain: Low Risk  (12/08/2022)   Overall Financial Resource Strain (CARDIA)    Difficulty  of Paying Living Expenses: Not hard at all  Food Insecurity: No Food Insecurity (12/08/2022)   Hunger Vital Sign    Worried About Running Out of Food in the Last Year: Never true    Ran Out of Food in the Last Year: Never true  Transportation Needs: No Transportation Needs (12/08/2022)   PRAPARE - Administrator, Civil Service (Medical): No    Lack of Transportation (Non-Medical): No  Physical Activity: Inactive (12/08/2022)   Exercise Vital Sign    Days of Exercise per Week: 0 days    Minutes of Exercise per Session: 0 min  Stress: No Stress Concern Present (12/08/2022)   [employer] of Occupational Health - Occupational Stress Questionnaire    Feeling of Stress : Not at all  Social Connections: Moderately Isolated (12/08/2022)   Social Connection and Isolation Panel [NHANES]    Frequency of Communication with Friends and Family: More than three times a week    Frequency of Social Gatherings with Friends and Family: Once a week     Attends Religious Services: More than 4 times per year    Active Member of [employer] or Organizations: No    Attends Banker Meetings: Never    Marital Status: Divorced  Catering manager Violence: Not At Risk (12/08/2022)   Humiliation, Afraid, Rape, and Kick questionnaire    Fear of Current or Ex-Partner: No    Emotionally Abused: No    Physically Abused: No    Sexually Abused: No    Outpatient Medications Prior to Visit  Medication Sig Dispense Refill   albuterol (VENTOLIN HFA) 108 (90 Base) MCG/ACT inhaler Inhale 2 puffs into the lungs every 6 (six) hours as needed for wheezing or shortness of breath. 18 g 2   budesonide-formoterol (SYMBICORT) 160-4.5 MCG/ACT inhaler Inhale 2 puffs into the lungs 2 (two) times daily. 1 each 3   buPROPion (WELLBUTRIN XL) 150 MG 24 hr tablet TAKE 1 TABLET BY MOUTH EVERY DAY 90 tablet 0   busPIRone (BUSPAR) 7.5 MG tablet TAKE 1 TABLET BY MOUTH 2 TIMES DAILY. 180 tablet 1   clonazePAM (KLONOPIN) 0.5 MG tablet Take 1 tablet (0.5 mg total) by mouth 2 (two) times daily as needed for anxiety. 10 tablet 1   cyclobenzaprine (FLEXERIL) 5 MG tablet Take 1 tablet (5 mg total) by mouth 3 (three) times daily as needed for muscle spasms. 60 tablet 1   dapagliflozin propanediol (FARXIGA) 5 MG TABS tablet TAKE 1 TABLET BY MOUTH DAILY BEFORE BREAKFAST. 30 tablet 1   famotidine (PEPCID) 40 MG tablet Take 1 tablet (40 mg total) by mouth 2 (two) times daily. 180 tablet 1   fluconazole (DIFLUCAN) 150 MG tablet Take 1 tablet (150 mg total) by mouth daily. 1 tablet 1   fluticasone (FLONASE) 50 MCG/ACT nasal spray Place 2 sprays into both nostrils daily. 16 g 6   gabapentin (NEURONTIN) 300 MG capsule Take 2 capsules (600 mg total) by mouth 2 (two) times daily. 360 capsule 0   icosapent Ethyl (VASCEPA) 1 g capsule Take 2 capsules (2 g total) by mouth 2 (two) times daily. 120 capsule 2   irbesartan (AVAPRO) 75 MG tablet Take 1 tablet (75 mg total) by mouth daily. 90 tablet  0   levothyroxine (SYNTHROID) 25 MCG tablet TAKE 1 TABLET BY MOUTH EVERY DAY BEFORE BREAKFAST 90 tablet 0   loratadine (CLARITIN) 10 MG tablet TAKE 1 TABLET BY MOUTH EVERY DAY 90 tablet 0   meloxicam (MOBIC) 15 MG  tablet TAKE 1 TABLET (15 MG TOTAL) BY MOUTH DAILY. 30 tablet 0   metFORMIN (GLUCOPHAGE) 1000 MG tablet Take 1 tablet (1,000 mg total) by mouth 2 (two) times daily with a meal. 180 tablet 2   omeprazole (PRILOSEC) 40 MG capsule 1 (ONE) CAPSULE EVERY MORNING 30 MINUTES BEFORE BREAKFAST 90 capsule 1   rosuvastatin (CRESTOR) 20 MG tablet Take 1 tablet (20 mg total) by mouth daily. 90 tablet 1   SODIUM FLUORIDE 5000 PPM 1.1 % PSTE See admin instructions.     traZODone (DESYREL) 150 MG tablet TAKE 1 TABLET BY MOUTH EVERYDAY AT BEDTIME 90 tablet 0   venlafaxine XR (EFFEXOR-XR) 75 MG 24 hr capsule TAKE 3 CAPSULES BY MOUTH DAILY IN THE MORNING 270 capsule 1   Vitamin D, Ergocalciferol, (DRISDOL) 1.25 MG (50000 UNIT) CAPS capsule Take 1 capsule (50,000 Units total) by mouth every 7 (seven) days. 5 capsule 2   No facility-administered medications prior to visit.    Allergies  Allergen Reactions   Moxifloxacin Anaphylaxis, Other (See Comments) and Hives   Avelox [Moxifloxacin Hcl In Nacl]    Cymbalta [Duloxetine Hcl]    Duloxetine Hives   Oxycodone-Acetaminophen Hives   [place]bre[place] [Amivantamab-Vmjw]     Review of Systems  Constitutional:  Negative for chills, fatigue and fever.  HENT:  Positive for congestion, sinus pressure and sinus pain. Negative for ear pain, postnasal drip, rhinorrhea and sore throat.   Respiratory:  Positive for cough. Negative for shortness of breath.   Cardiovascular:  Negative for chest pain.  Gastrointestinal:  Negative for diarrhea and nausea.  Neurological:  Negative for dizziness and headaches.       Objective:        05/15/2023   11:10 AM 01/09/2023    2:16 PM 12/08/2022   11:32 AM  Vitals with BMI  Height 5' 10.5" 5' 10.5" 5' 10.5"  Weight 304  lbs 303 lbs 6 oz 303 lbs  BMI 42.99 42.9 42.85  Systolic 120 130 161  Diastolic 80 80 80  Pulse 104 84 84    No data found.   Physical Exam Vitals reviewed.  Constitutional:      Appearance: Normal appearance. She is normal weight.  HENT:     Right Ear: Hearing and external ear normal. Swelling present. Tympanic membrane is bulging. Tympanic membrane is not injected.     Left Ear: Hearing and external ear normal. Swelling present. A middle ear effusion is present. Tympanic membrane is bulging. Tympanic membrane is not injected.     Nose: Congestion and rhinorrhea present. Rhinorrhea is purulent.     Right Nostril: Epistaxis present.     Right Turbinates: Swollen.     Left Turbinates: Swollen.     Right Sinus: Maxillary sinus tenderness and frontal sinus tenderness present.     Left Sinus: Maxillary sinus tenderness and frontal sinus tenderness present.     Mouth/Throat:     Mouth: Mucous membranes are moist.     Pharynx: Oropharynx is clear.     Tonsils: No tonsillar exudate or tonsillar abscesses.  Cardiovascular:     Rate and Rhythm: Normal rate and regular rhythm.     Pulses: Normal pulses.     Heart sounds: Normal heart sounds. No murmur heard. Pulmonary:     Effort: Pulmonary effort is normal. No respiratory distress.     Breath sounds: Normal breath sounds.  Abdominal:     General: Abdomen is flat. Bowel sounds are normal.     Palpations:  Abdomen is soft.     Tenderness: There is no abdominal tenderness.  Neurological:     Mental Status: She is alert and oriented to person, place, and time.  Psychiatric:        Mood and Affect: Mood normal.        Behavior: Behavior normal.     Health Maintenance Due  Topic Date Due   OPHTHALMOLOGY EXAM  Never done   Zoster Vaccines- Shingrix (2 of 2) 08/05/2021    There are no preventive care reminders to display for this patient.   Lab Results  Component Value Date   TSH 1.740 09/05/2022   Lab Results  Component  Value Date   WBC 10.9 (H) 01/09/2023   HGB 14.7 01/09/2023   HCT 42.6 01/09/2023   MCV 91 01/09/2023   PLT 276 01/09/2023   Lab Results  Component Value Date   NA 140 01/09/2023   K 4.8 01/09/2023   CO2 23 01/09/2023   GLUCOSE 102 (H) 01/09/2023   BUN 10 01/09/2023   CREATININE 0.73 01/09/2023   BILITOT 0.5 01/09/2023   ALKPHOS 104 01/09/2023   AST 31 01/09/2023   ALT 41 (H) 01/09/2023   PROT 6.7 01/09/2023   ALBUMIN 4.3 01/09/2023   CALCIUM 9.8 01/09/2023   ANIONGAP 11 04/16/2019   EGFR 96 01/09/2023   GFR 71.87 12/31/2019   Lab Results  Component Value Date   CHOL 168 12/08/2022   Lab Results  Component Value Date   HDL 57 12/08/2022   Lab Results  Component Value Date   LDLCALC 79 12/08/2022   Lab Results  Component Value Date   TRIG 191 (H) 12/08/2022   Lab Results  Component Value Date   CHOLHDL 2.9 12/08/2022   Lab Results  Component Value Date   HGBA1C 8.3 (H) 12/08/2022       Assessment & Plan:  Acute non-recurrent maxillary sinusitis Assessment & Plan: Z-pack and Prednisone sent to pharmacy If symptoms continue after medication discussed possible allergic sinusitis Patient will monitor symptoms and let us know if they continue after treatment  Orders: -     Azithromycin; 2 DAILY FOR FIRST DAY, THEN DECREASE TO ONE DAILY FOR 4 MORE DAYS.  Dispense: 6 tablet; Refill: 0 -     predniSONE; Take 3 tablets (60 mg total) by mouth daily with breakfast for 3 days, THEN 2 tablets (40 mg total) daily with breakfast for 3 days, THEN 1 tablet (20 mg total) daily with breakfast for 3 days.  Dispense: 18 tablet; Refill: 0 -     Mometasone Furoate; Place 2 sprays into the nose daily.  Dispense: 17 g; Refill: 12  Acute cough Assessment & Plan: Continue to monitor cough If cough becomes productive or worse after antibiotics patient will call us  Orders: -     POC COVID-19 BinaxNow -     Azithromycin; 2 DAILY FOR FIRST DAY, THEN DECREASE TO ONE DAILY FOR 4  MORE DAYS.  Dispense: 6 tablet; Refill: 0     Meds ordered this encounter  Medications   azithromycin (ZITHROMAX) 250 MG tablet    Sig: 2 DAILY FOR FIRST DAY, THEN DECREASE TO ONE DAILY FOR 4 MORE DAYS.    Dispense:  6 tablet    Refill:  0   predniSONE (DELTASONE) 20 MG tablet    Sig: Take 3 tablets (60 mg total) by mouth daily with breakfast for 3 days, THEN 2 tablets (40 mg total) daily with breakfast for 3  days, THEN 1 tablet (20 mg total) daily with breakfast for 3 days.    Dispense:  18 tablet    Refill:  0   mometasone (NASONEX) 50 MCG/ACT nasal spray    Sig: Place 2 sprays into the nose daily.    Dispense:  17 g    Refill:  12    Orders Placed This Encounter  Procedures   POC COVID-19 BinaxNow     Follow-up: Return if symptoms worsen or fail to improve.  An After Visit Summary was printed and given to the patient.  Langley Gauss, [facility] [phone]

## 2023-05-15 NOTE — Assessment & Plan Note (Signed)
Z-pack and Prednisone sent to pharmacy If symptoms continue after medication discussed possible allergic sinusitis Patient will monitor symptoms and let us know if they continue after treatment

## 2023-05-15 NOTE — Assessment & Plan Note (Signed)
Continue to monitor cough If cough becomes productive or worse after antibiotics patient will call us

## 2023-05-22 ENCOUNTER — Ambulatory Visit
Admission: RE | Admit: 2023-05-22 | Discharge: 2023-05-22 | Disposition: A | Discharge status: Home / Self Care | Payer: BC Managed Care – PPO | Source: Ambulatory Visit | Attending: Physician Assistant | Admitting: Physician Assistant

## 2023-05-22 ENCOUNTER — Telehealth: Payer: Self-pay

## 2023-05-22 DIAGNOSIS — R928 Other abnormal and inconclusive findings on diagnostic imaging of breast: Secondary | ICD-10-CM

## 2023-05-26 ENCOUNTER — Other Ambulatory Visit: Payer: Self-pay | Admitting: Family Medicine

## 2023-05-26 DIAGNOSIS — I10 Essential (primary) hypertension: Secondary | ICD-10-CM

## 2023-05-28 ENCOUNTER — Telehealth: Payer: Self-pay

## 2023-05-28 NOTE — Telephone Encounter (Signed)
The patient left a message about her upcoming appointment. I tried to call the patient back. Left a message for the patient to call the office.

## 2023-05-28 NOTE — Telephone Encounter (Signed)
Appointment has been rescheduled.

## 2023-05-29 ENCOUNTER — Ambulatory Visit: Payer: BC Managed Care – PPO | Admitting: Family Medicine

## 2023-05-31 ENCOUNTER — Ambulatory Visit: Payer: BC Managed Care – PPO | Admitting: Family Medicine

## 2023-06-03 NOTE — Assessment & Plan Note (Signed)
Control: Fair Recommend check sugars fasting daily. Recommend check feet daily. Recommend annual eye exams. Medicines:  Metformin 1000 mg BID, semaglutide 7 mg daily Continue to work on eating a healthy diet and exercise.  Labs drawn today.

## 2023-06-03 NOTE — Assessment & Plan Note (Signed)
well controlled Continue Synthroid at current dose

## 2023-06-03 NOTE — Assessment & Plan Note (Signed)
Well controlled.  No changes to medicines. Irbesartan 75 mg once daily.  Continue to work on eating a healthy diet and exercise.  Labs drawn today.   

## 2023-06-03 NOTE — Assessment & Plan Note (Signed)
Well controlled.  No medication changes recommended. Taking crestor 20 mg daily  Continue healthy diet and exercise.

## 2023-06-03 NOTE — Progress Notes (Signed)
Subjective:  Patient ID: Holly [name], female    DOB: May 09, 1966  Age: 57 y.o. MRN: [id number]  Chief Complaint  Patient presents with   Medical Management of Chronic Issues    HPI   DM II:  Complications: glomerulopathy, polyneuropathy. Medications: Taking metformin 1000 mg BID, rybelsus 7 mg daily, gabapentin 300 mg 2 oral twice daily.  Checking blood sugars periodically ranges have been 120-280. Checks feet daily, overdue for eye appt.  Hypertension: takes Avapro 75 mg daily, does not exercise regularly  Hyperlipidemia: Taking crestor 20 mg daily   OSA: does not have CPAP machine  Anxiety/Depression: Buspar and Klonopin, effexor 75 mg 3 daily  GERD: nothing. She was taking pepcid  Hypothyroidism: synthroid 25 mcg daily in am   Insomnia: taking trazodone 150 mg before bed . Ambien caused drowsiness the following morning.     12/08/2022   11:39 AM 11/09/2022    3:01 PM 11/09/2022    2:37 PM 09/05/2022   10:37 AM 02/03/2021    7:50 AM  Depression screen PHQ 2/9  Decreased Interest 3  3 1 1   Down, Depressed, Hopeless 2  3 2 1   PHQ - 2 Score 5  6 3 2   Altered sleeping 1  3 3 2   Tired, decreased energy 2  3 3 3   Change in appetite 2 -- 3 3 2   Feeling bad or failure about yourself  0  0 2   Trouble concentrating 2  2 1 3   Moving slowly or fidgety/restless 2  0 0 0  Suicidal thoughts   0 0 0  PHQ-9 Score 14  17 15 12   Difficult doing work/chores Very difficult  Extremely dIfficult Somewhat difficult Very difficult        05/15/2023   11:13 AM  Fall Risk   Falls in the past year? 0  Number falls in past yr: 0  Injury with Fall? 0  Risk for fall due to : No Fall Risks  Follow up Falls evaluation completed    Patient Care Team: Cox, Fritzi Mandes, MD as PCP - General (Family Medicine)   Review of Systems  Current Outpatient Medications on File Prior to Visit  Medication Sig Dispense Refill   albuterol (VENTOLIN HFA) 108 (90 Base) MCG/ACT inhaler Inhale 2 puffs into  the lungs every 6 (six) hours as needed for wheezing or shortness of breath. 18 g 2   azithromycin (ZITHROMAX) 250 MG tablet 2 DAILY FOR FIRST DAY, THEN DECREASE TO ONE DAILY FOR 4 MORE DAYS. 6 tablet 0   budesonide-formoterol (SYMBICORT) 160-4.5 MCG/ACT inhaler Inhale 2 puffs into the lungs 2 (two) times daily. 1 each 3   buPROPion (WELLBUTRIN XL) 150 MG 24 hr tablet TAKE 1 TABLET BY MOUTH EVERY DAY 90 tablet 0   busPIRone (BUSPAR) 7.5 MG tablet TAKE 1 TABLET BY MOUTH 2 TIMES DAILY. 180 tablet 1   clonazePAM (KLONOPIN) 0.5 MG tablet Take 1 tablet (0.5 mg total) by mouth 2 (two) times daily as needed for anxiety. 10 tablet 1   cyclobenzaprine (FLEXERIL) 5 MG tablet Take 1 tablet (5 mg total) by mouth 3 (three) times daily as needed for muscle spasms. 60 tablet 1   dapagliflozin propanediol (FARXIGA) 5 MG TABS tablet TAKE 1 TABLET BY MOUTH DAILY BEFORE BREAKFAST. 30 tablet 1   famotidine (PEPCID) 40 MG tablet Take 1 tablet (40 mg total) by mouth 2 (two) times daily. 180 tablet 1   fluconazole (DIFLUCAN) 150 MG tablet Take 1 tablet (  150 mg total) by mouth daily. 1 tablet 1   fluticasone (FLONASE) 50 MCG/ACT nasal spray Place 2 sprays into both nostrils daily. 16 g 6   gabapentin (NEURONTIN) 300 MG capsule Take 2 capsules (600 mg total) by mouth 2 (two) times daily. 360 capsule 0   icosapent Ethyl (VASCEPA) 1 g capsule Take 2 capsules (2 g total) by mouth 2 (two) times daily. 120 capsule 2   irbesartan (AVAPRO) 75 MG tablet TAKE 1 TABLET BY MOUTH EVERY DAY 90 tablet 0   levothyroxine (SYNTHROID) 25 MCG tablet TAKE 1 TABLET BY MOUTH EVERY DAY BEFORE BREAKFAST 90 tablet 0   loratadine (CLARITIN) 10 MG tablet TAKE 1 TABLET BY MOUTH EVERY DAY 90 tablet 0   meloxicam (MOBIC) 15 MG tablet TAKE 1 TABLET (15 MG TOTAL) BY MOUTH DAILY. 30 tablet 0   metFORMIN (GLUCOPHAGE) 1000 MG tablet Take 1 tablet (1,000 mg total) by mouth 2 (two) times daily with a meal. 180 tablet 2   mometasone (NASONEX) 50 MCG/ACT nasal  spray Place 2 sprays into the nose daily. 17 g 12   omeprazole (PRILOSEC) 40 MG capsule 1 (ONE) CAPSULE EVERY MORNING 30 MINUTES BEFORE BREAKFAST 90 capsule 1   rosuvastatin (CRESTOR) 20 MG tablet Take 1 tablet (20 mg total) by mouth daily. 90 tablet 1   SODIUM FLUORIDE 5000 PPM 1.1 % PSTE See admin instructions.     traZODone (DESYREL) 150 MG tablet TAKE 1 TABLET BY MOUTH EVERYDAY AT BEDTIME 90 tablet 0   venlafaxine XR (EFFEXOR-XR) 75 MG 24 hr capsule TAKE 3 CAPSULES BY MOUTH DAILY IN THE MORNING 270 capsule 1   Vitamin D, Ergocalciferol, (DRISDOL) 1.25 MG (50000 UNIT) CAPS capsule Take 1 capsule (50,000 Units total) by mouth every 7 (seven) days. 5 capsule 2   No current facility-administered medications on file prior to visit.   Past Medical History:  Diagnosis Date   Anxiety    COVID-19 virus detected 12/30/2019   10/30/2019-SARS-CoV-2-positive Didn't require hospitalization     Depression    Diabetes mellitus without complication (HCC)    Fatty liver    Goiter    Hashimoto's thyroiditis    Hilar adenopathy 03/20/19  04/11/19   CTA CHEST and PET per DR. Anne Ng [facility]   History of kidney stones    Hypertension    Mediastinal adenopathy    PER CTA CHEST PET SCAN per DR. Lanae Crumbly [facility]   Pneumonia    Supraclavicular adenopathy 03/20/2019   PER CTA CHEST and PET per DR. Sudie Bailey [facility]   Past Surgical History:  Procedure Laterality Date   ABDOMINAL HYSTERECTOMY     COMPLETE   ADHESIOLYSIS     APPENDECTOMY     CESAREAN SECTION     MEDIASTINOSCOPY N/A 04/18/2019   Procedure: MEDIASTINOSCOPY;  Surgeon: Delight Ovens, MD;  Location: [facility] OR;  Service: Thoracic;  Laterality: N/A;   VIDEO BRONCHOSCOPY WITH ENDOBRONCHIAL ULTRASOUND N/A 04/18/2019   Procedure: VIDEO BRONCHOSCOPY WITH ENDOBRONCHIAL ULTRASOUND;  Surgeon: Delight Ovens, MD;  Location: [facility] OR;  Service: Thoracic;  Laterality: N/A;    Family History  Problem Relation Age of Onset   Breast cancer Mother    Anuerysm  Mother        BRAIN   Cancer Mother        BREAST   COPD Father    Hyperlipidemia Father    Congestive Heart Failure Father    Bipolar disorder Sister    Diabetes Sister    Fibromyalgia Sister  Cancer Paternal Uncle        LUNG   Breast cancer Maternal Grandmother    Cancer Maternal Grandmother 61       BREAST   Cancer Paternal Grandfather        LUNG   Social History   Socioeconomic History   Marital status: Divorced    Spouse name: Not on file   Number of children: Not on file   Years of education: Not on file   Highest education level: Not on file  Occupational History   Not on file  Tobacco Use   Smoking status: Never   Smokeless tobacco: Never  Vaping Use   Vaping status: Never Used  Substance and Sexual Activity   Alcohol use: Yes    Comment: VERY RARE OCCASION   Drug use: Never   Sexual activity: Not on file  Other Topics Concern   Not on file  Social History Narrative   Not on file   Social Determinants of Health   Financial Resource Strain: Low Risk  (12/08/2022)   Overall Financial Resource Strain (CARDIA)    Difficulty of Paying Living Expenses: Not hard at all  Food Insecurity: No Food Insecurity (12/08/2022)   Hunger Vital Sign    Worried About Running Out of Food in the Last Year: Never true    Ran Out of Food in the Last Year: Never true  Transportation Needs: No Transportation Needs (12/08/2022)   PRAPARE - Administrator, Civil Service (Medical): No    Lack of Transportation (Non-Medical): No  Physical Activity: Inactive (12/08/2022)   Exercise Vital Sign    Days of Exercise per Week: 0 days    Minutes of Exercise per Session: 0 min  Stress: No Stress Concern Present (12/08/2022)   [employer] of Occupational Health - Occupational Stress Questionnaire    Feeling of Stress : Not at all  Social Connections: Moderately Isolated (12/08/2022)   Social Connection and Isolation Panel [NHANES]    Frequency of Communication with  Friends and Family: More than three times a week    Frequency of Social Gatherings with Friends and Family: Once a week    Attends Religious Services: More than 4 times per year    Active Member of [employer] or Organizations: No    Attends Banker Meetings: Never    Marital Status: Divorced    Objective:  There were no vitals taken for this visit.     05/15/2023   11:10 AM 01/09/2023    2:16 PM 12/08/2022   11:32 AM  BP/Weight  Systolic BP 120 130 124  Diastolic BP 80 80 80  Wt. (Lbs) 304 303.4 303  BMI 43 kg/m2 42.92 kg/m2 42.86 kg/m2    Physical Exam  Diabetic Foot Exam - Simple   No data filed      Lab Results  Component Value Date   WBC 10.9 (H) 01/09/2023   HGB 14.7 01/09/2023   HCT 42.6 01/09/2023   PLT 276 01/09/2023   GLUCOSE 102 (H) 01/09/2023   CHOL 168 12/08/2022   TRIG 191 (H) 12/08/2022   HDL 57 12/08/2022   LDLCALC 79 12/08/2022   ALT 41 (H) 01/09/2023   AST 31 01/09/2023   NA 140 01/09/2023   K 4.8 01/09/2023   CL 99 01/09/2023   CREATININE 0.73 01/09/2023   BUN 10 01/09/2023   CO2 23 01/09/2023   TSH 1.740 09/05/2022   INR 1.0 04/16/2019   HGBA1C 8.3 (H)  12/08/2022   MICROALBUR 80 08/15/2020      Assessment & Plan:    Primary hypertension  Diabetic glomerulopathy (HCC)  Hypothyroidism (acquired)  Mixed hyperlipidemia     No orders of the defined types were placed in this encounter.   No orders of the defined types were placed in this encounter.    Follow-up: No follow-ups on file.   I,Jabron Weese I Leal-Borjas,acting as a scribe for Blane Ohara, MD.,have documented all relevant documentation on the behalf of Blane Ohara, MD,as directed by  Blane Ohara, MD while in the presence of Blane Ohara, MD.   An After Visit Summary was printed and given to the patient.  Blane Ohara, MD [facility] [phone]

## 2023-06-04 ENCOUNTER — Encounter: Payer: BC Managed Care – PPO | Admitting: Family Medicine

## 2023-06-04 DIAGNOSIS — E782 Mixed hyperlipidemia: Secondary | ICD-10-CM

## 2023-06-04 DIAGNOSIS — I10 Essential (primary) hypertension: Secondary | ICD-10-CM

## 2023-06-04 DIAGNOSIS — E039 Hypothyroidism, unspecified: Secondary | ICD-10-CM

## 2023-06-04 DIAGNOSIS — E1121 Type 2 diabetes mellitus with diabetic nephropathy: Secondary | ICD-10-CM

## 2023-06-05 NOTE — Telephone Encounter (Signed)
error 

## 2023-06-07 ENCOUNTER — Other Ambulatory Visit: Payer: Self-pay | Admitting: Family Medicine

## 2023-06-07 DIAGNOSIS — F332 Major depressive disorder, recurrent severe without psychotic features: Secondary | ICD-10-CM

## 2023-06-08 ENCOUNTER — Other Ambulatory Visit: Payer: Self-pay | Admitting: Family Medicine

## 2023-06-08 DIAGNOSIS — F331 Major depressive disorder, recurrent, moderate: Secondary | ICD-10-CM

## 2023-06-19 ENCOUNTER — Telehealth: Payer: Self-pay

## 2023-06-19 NOTE — Telephone Encounter (Signed)
I called the patient today to see about getting an appointment scheduled for chronic f.up fasting. Looks like this patient is currently overdue for an appointment. I left a message for the patient to call the office back.  If the patient has not called back within 1 week, please send mail the patient a letter.

## 2023-06-23 ENCOUNTER — Other Ambulatory Visit: Payer: Self-pay | Admitting: Family Medicine

## 2023-06-23 DIAGNOSIS — E1142 Type 2 diabetes mellitus with diabetic polyneuropathy: Secondary | ICD-10-CM

## 2023-06-23 DIAGNOSIS — R42 Dizziness and giddiness: Secondary | ICD-10-CM

## 2023-06-26 ENCOUNTER — Encounter: Payer: Self-pay | Admitting: Family Medicine

## 2023-06-26 NOTE — Telephone Encounter (Signed)
I am mailing the pt a letter to see about having her call to make an appt.

## 2023-07-06 ENCOUNTER — Other Ambulatory Visit: Payer: Self-pay | Admitting: Physician Assistant

## 2023-07-06 DIAGNOSIS — R1084 Generalized abdominal pain: Secondary | ICD-10-CM

## 2023-07-29 ENCOUNTER — Encounter: Payer: Self-pay | Admitting: Family Medicine

## 2023-07-31 ENCOUNTER — Encounter: Payer: Self-pay | Admitting: Physician Assistant

## 2023-07-31 ENCOUNTER — Ambulatory Visit: Payer: BC Managed Care – PPO | Admitting: Physician Assistant

## 2023-07-31 VITALS — BP 130/80 | HR 88 | Temp 97.2°F | Ht 70.5 in | Wt 300.4 lb

## 2023-07-31 DIAGNOSIS — E039 Hypothyroidism, unspecified: Secondary | ICD-10-CM

## 2023-07-31 DIAGNOSIS — R319 Hematuria, unspecified: Secondary | ICD-10-CM

## 2023-07-31 DIAGNOSIS — E782 Mixed hyperlipidemia: Secondary | ICD-10-CM

## 2023-07-31 DIAGNOSIS — E1142 Type 2 diabetes mellitus with diabetic polyneuropathy: Secondary | ICD-10-CM | POA: Diagnosis not present

## 2023-07-31 DIAGNOSIS — B3731 Acute candidiasis of vulva and vagina: Secondary | ICD-10-CM

## 2023-07-31 DIAGNOSIS — I10 Essential (primary) hypertension: Secondary | ICD-10-CM | POA: Diagnosis not present

## 2023-07-31 HISTORY — DX: Acute candidiasis of vulva and vagina: B37.31

## 2023-07-31 LAB — POCT URINALYSIS DIP (CLINITEK)
Bilirubin, UA: NEGATIVE
Glucose, UA: NEGATIVE mg/dL
Ketones, POC UA: NEGATIVE mg/dL
Leukocytes, UA: NEGATIVE
Nitrite, UA: NEGATIVE
POC PROTEIN,UA: NEGATIVE
Spec Grav, UA: 1.01 (ref 1.010–1.025)
Urobilinogen, UA: 0.2 U/dL
pH, UA: 6 (ref 5.0–8.0)

## 2023-07-31 MED ORDER — FLUCONAZOLE 150 MG PO TABS
150.0000 mg | ORAL_TABLET | Freq: Every day | ORAL | 1 refills | Status: DC
Start: 2023-07-31 — End: 2023-08-07

## 2023-07-31 NOTE — Assessment & Plan Note (Signed)
Labs drawn today Will adjust medicine as needed Continue taking Synthroid 

## 2023-07-31 NOTE — Assessment & Plan Note (Signed)
Labs drawn today Will adjust treatment at chronic visit Continue taking Crestor 20mg 

## 2023-07-31 NOTE — Progress Notes (Signed)
Acute Office Visit  Subjective:    Patient ID: Holly [name], female    DOB: 24-Nov-1965, 57 y.o.   MRN: [id number]  Chief Complaint  Patient presents with   Vaginal Itching    HPI: Patient present in office today with vaginal itching, and white discharge, stated this has been going on for about a week. Patient state she has been using vagisil due to the pain.Patient denies fever, nausea, vomiting. Admits to some RLQ pain. Notices it more when she is sitting. States that she first noticed it 3-4 days ago. Last yeast infection was back in June after she was treated for a sinus infection. She admits to some dysuria.     Past Medical History:  Diagnosis Date   Anxiety    COVID-19 virus detected 12/30/2019   10/30/2019-SARS-CoV-2-positive Didn't require hospitalization     Depression    Diabetes mellitus without complication (HCC)    Fatty liver    Goiter    Hashimoto's thyroiditis    Hilar adenopathy 03/20/19  04/11/19   CTA CHEST and PET per DR. Anne Ng [facility]   History of kidney stones    Hypertension    Mediastinal adenopathy    PER CTA CHEST PET SCAN per DR. Lanae Crumbly [facility]   Pneumonia    Supraclavicular adenopathy 03/20/2019   PER CTA CHEST and PET per DR. Sudie Bailey [facility]    Past Surgical History:  Procedure Laterality Date   ABDOMINAL HYSTERECTOMY     COMPLETE   ADHESIOLYSIS     APPENDECTOMY     CESAREAN SECTION     MEDIASTINOSCOPY N/A 04/18/2019   Procedure: MEDIASTINOSCOPY;  Surgeon: Delight Ovens, MD;  Location: [facility] OR;  Service: Thoracic;  Laterality: N/A;   VIDEO BRONCHOSCOPY WITH ENDOBRONCHIAL ULTRASOUND N/A 04/18/2019   Procedure: VIDEO BRONCHOSCOPY WITH ENDOBRONCHIAL ULTRASOUND;  Surgeon: Delight Ovens, MD;  Location: [facility] OR;  Service: Thoracic;  Laterality: N/A;    Family History  Problem Relation Age of Onset   Breast cancer Mother    Anuerysm Mother        BRAIN   Cancer Mother        BREAST   COPD Father    Hyperlipidemia Father    Congestive  Heart Failure Father    Bipolar disorder Sister    Diabetes Sister    Fibromyalgia Sister    Cancer Paternal Uncle        LUNG   Breast cancer Maternal Grandmother    Cancer Maternal Grandmother 23       BREAST   Cancer Paternal Grandfather        LUNG    Social History   Socioeconomic History   Marital status: Divorced    Spouse name: Not on file   Number of children: Not on file   Years of education: Not on file   Highest education level: Not on file  Occupational History   Not on file  Tobacco Use   Smoking status: Never   Smokeless tobacco: Never  Vaping Use   Vaping status: Never Used  Substance and Sexual Activity   Alcohol use: Yes    Comment: VERY RARE OCCASION   Drug use: Never   Sexual activity: Not on file  Other Topics Concern   Not on file  Social History Narrative   Not on file   Social Determinants of Health   Financial Resource Strain: Low Risk  (12/08/2022)   Overall Financial Resource Strain (CARDIA)    Difficulty of  Paying Living Expenses: Not hard at all  Food Insecurity: No Food Insecurity (12/08/2022)   Hunger Vital Sign    Worried About Running Out of Food in the Last Year: Never true    Ran Out of Food in the Last Year: Never true  Transportation Needs: No Transportation Needs (12/08/2022)   PRAPARE - Administrator, Civil Service (Medical): No    Lack of Transportation (Non-Medical): No  Physical Activity: Inactive (12/08/2022)   Exercise Vital Sign    Days of Exercise per Week: 0 days    Minutes of Exercise per Session: 0 min  Stress: No Stress Concern Present (12/08/2022)   [employer] of Occupational Health - Occupational Stress Questionnaire    Feeling of Stress : Not at all  Social Connections: Moderately Isolated (12/08/2022)   Social Connection and Isolation Panel [NHANES]    Frequency of Communication with Friends and Family: More than three times a week    Frequency of Social Gatherings with Friends and Family:  Once a week    Attends Religious Services: More than 4 times per year    Active Member of [employer] or Organizations: No    Attends Banker Meetings: Never    Marital Status: Divorced  Catering manager Violence: Not At Risk (12/08/2022)   Humiliation, Afraid, Rape, and Kick questionnaire    Fear of Current or Ex-Partner: No    Emotionally Abused: No    Physically Abused: No    Sexually Abused: No    Outpatient Medications Prior to Visit  Medication Sig Dispense Refill   albuterol (VENTOLIN HFA) 108 (90 Base) MCG/ACT inhaler Inhale 2 puffs into the lungs every 6 (six) hours as needed for wheezing or shortness of breath. 18 g 2   budesonide-formoterol (SYMBICORT) 160-4.5 MCG/ACT inhaler Inhale 2 puffs into the lungs 2 (two) times daily. 1 each 3   buPROPion (WELLBUTRIN XL) 150 MG 24 hr tablet TAKE 1 TABLET BY MOUTH EVERY DAY 90 tablet 0   busPIRone (BUSPAR) 7.5 MG tablet TAKE 1 TABLET BY MOUTH TWICE A DAY 180 tablet 1   clonazePAM (KLONOPIN) 0.5 MG tablet Take 1 tablet (0.5 mg total) by mouth 2 (two) times daily as needed for anxiety. 10 tablet 1   cyclobenzaprine (FLEXERIL) 5 MG tablet Take 1 tablet (5 mg total) by mouth 3 (three) times daily as needed for muscle spasms. 60 tablet 1   dapagliflozin propanediol (FARXIGA) 5 MG TABS tablet TAKE 1 TABLET BY MOUTH DAILY BEFORE BREAKFAST. 30 tablet 1   famotidine (PEPCID) 40 MG tablet TAKE 1 TABLET BY MOUTH TWICE A DAY 180 tablet 1   fluticasone (FLONASE) 50 MCG/ACT nasal spray Place 2 sprays into both nostrils daily. 16 g 6   gabapentin (NEURONTIN) 300 MG capsule TAKE 2 CAPSULES BY MOUTH 2 TIMES DAILY. 360 capsule 0   icosapent Ethyl (VASCEPA) 1 g capsule Take 2 capsules (2 g total) by mouth 2 (two) times daily. 120 capsule 2   irbesartan (AVAPRO) 75 MG tablet TAKE 1 TABLET BY MOUTH EVERY DAY 90 tablet 0   levothyroxine (SYNTHROID) 25 MCG tablet TAKE 1 TABLET BY MOUTH EVERY DAY BEFORE BREAKFAST 90 tablet 0   loratadine (CLARITIN) 10 MG  tablet TAKE 1 TABLET BY MOUTH EVERY DAY 90 tablet 0   meloxicam (MOBIC) 15 MG tablet TAKE 1 TABLET (15 MG TOTAL) BY MOUTH DAILY. 30 tablet 0   metFORMIN (GLUCOPHAGE) 1000 MG tablet Take 1 tablet (1,000 mg total) by mouth 2 (two)  times daily with a meal. 180 tablet 2   mometasone (NASONEX) 50 MCG/ACT nasal spray Place 2 sprays into the nose daily. 17 g 12   omeprazole (PRILOSEC) 40 MG capsule 1 (ONE) CAPSULE EVERY MORNING 30 MINUTES BEFORE BREAKFAST 90 capsule 1   rosuvastatin (CRESTOR) 20 MG tablet Take 1 tablet (20 mg total) by mouth daily. 90 tablet 1   SODIUM FLUORIDE 5000 PPM 1.1 % PSTE See admin instructions.     traZODone (DESYREL) 150 MG tablet TAKE 1 TABLET BY MOUTH EVERYDAY AT BEDTIME 90 tablet 0   venlafaxine XR (EFFEXOR-XR) 75 MG 24 hr capsule TAKE 3 CAPSULES BY MOUTH DAILY IN THE MORNING 270 capsule 1   Vitamin D, Ergocalciferol, (DRISDOL) 1.25 MG (50000 UNIT) CAPS capsule Take 1 capsule (50,000 Units total) by mouth every 7 (seven) days. 5 capsule 2   fluconazole (DIFLUCAN) 150 MG tablet Take 1 tablet (150 mg total) by mouth daily. 1 tablet 1   azithromycin (ZITHROMAX) 250 MG tablet 2 DAILY FOR FIRST DAY, THEN DECREASE TO ONE DAILY FOR 4 MORE DAYS. 6 tablet 0   No facility-administered medications prior to visit.    Allergies  Allergen Reactions   Moxifloxacin Anaphylaxis, Other (See Comments) and Hives   Avelox [Moxifloxacin Hcl In Nacl]    Cymbalta [Duloxetine Hcl]    Duloxetine Hives   Oxycodone-Acetaminophen Hives   Rybrevant [Amivantamab-Vmjw]     Review of Systems  Constitutional:  Negative for fatigue.  HENT:  Negative for congestion, ear pain and sore throat.   Respiratory:  Negative for cough and shortness of breath.   Cardiovascular:  Negative for chest pain.  Gastrointestinal:  Negative for abdominal pain, constipation, diarrhea, nausea and vomiting.  Genitourinary:  Positive for vaginal discharge and vaginal pain. Negative for dysuria, frequency and urgency.   Musculoskeletal:  Negative for arthralgias, back pain and myalgias.  Neurological:  Negative for dizziness and headaches.  Psychiatric/Behavioral:  Negative for agitation and sleep disturbance. The patient is not nervous/anxious.        Objective:        07/31/2023    1:52 PM 05/15/2023   11:10 AM 01/09/2023    2:16 PM  Vitals with BMI  Height 5' 10.5" 5' 10.5" 5' 10.5"  Weight 300 lbs 6 oz 304 lbs 303 lbs 6 oz  BMI 42.48 42.99 42.9  Systolic 130 120 147  Diastolic 80 80 80  Pulse 88 104 84    Orthostatic VS for the past 72 hrs (Last 3 readings):  Patient Position BP Location Cuff Size  07/31/23 1352 Sitting Left Arm Large     Physical Exam Vitals reviewed.  Constitutional:      Appearance: Normal appearance.  Cardiovascular:     Rate and Rhythm: Normal rate and regular rhythm.     Heart sounds: Normal heart sounds.  Pulmonary:     Effort: Pulmonary effort is normal.     Breath sounds: Normal breath sounds.  Abdominal:     General: Bowel sounds are normal.     Palpations: Abdomen is soft.     Tenderness: There is no abdominal tenderness.  Neurological:     Mental Status: She is alert and oriented to person, place, and time.  Psychiatric:        Mood and Affect: Mood normal.        Behavior: Behavior normal.     Health Maintenance Due  Topic Date Due   OPHTHALMOLOGY EXAM  Never done   Zoster Vaccines- Shingrix (  2 of 2) 08/05/2021   HEMOGLOBIN A1C  06/08/2023    There are no preventive care reminders to display for this patient.   Lab Results  Component Value Date   TSH 1.740 09/05/2022   Lab Results  Component Value Date   WBC 10.9 (H) 01/09/2023   HGB 14.7 01/09/2023   HCT 42.6 01/09/2023   MCV 91 01/09/2023   PLT 276 01/09/2023   Lab Results  Component Value Date   NA 140 01/09/2023   K 4.8 01/09/2023   CO2 23 01/09/2023   GLUCOSE 102 (H) 01/09/2023   BUN 10 01/09/2023   CREATININE 0.73 01/09/2023   BILITOT 0.5 01/09/2023   ALKPHOS 104  01/09/2023   AST 31 01/09/2023   ALT 41 (H) 01/09/2023   PROT 6.7 01/09/2023   ALBUMIN 4.3 01/09/2023   CALCIUM 9.8 01/09/2023   ANIONGAP 11 04/16/2019   EGFR 96 01/09/2023   GFR 71.87 12/31/2019   Lab Results  Component Value Date   CHOL 168 12/08/2022   Lab Results  Component Value Date   HDL 57 12/08/2022   Lab Results  Component Value Date   LDLCALC 79 12/08/2022   Lab Results  Component Value Date   TRIG 191 (H) 12/08/2022   Lab Results  Component Value Date   CHOLHDL 2.9 12/08/2022   Lab Results  Component Value Date   HGBA1C 8.3 (H) 12/08/2022       Assessment & Plan:  Vaginal yeast infection Assessment & Plan: Prescribed Diflucan 150mg  Got UA to rule out infection   Orders: -     Fluconazole; Take 1 tablet (150 mg total) by mouth daily.  Dispense: 1 tablet; Refill: 1 -     POCT URINALYSIS DIP (CLINITEK)  Primary hypertension Assessment & Plan: Continue monitoring symptoms Will adjust treatment depending on symptoms and BP  Orders: -     CBC with Differential/Platelet -     Comprehensive metabolic panel  Hypothyroidism (acquired) Assessment & Plan: Labs drawn today Will adjust medicine as needed Continue taking Synthroid  Orders: -     T4, free -     TSH -     VITAMIN D 25 Hydroxy (Vit-D Deficiency, Fractures)  Diabetic polyneuropathy associated with type 2 diabetes mellitus (HCC) -     Hemoglobin A1c  Mixed hyperlipidemia Assessment & Plan: Labs drawn today Will adjust treatment at chronic visit Continue taking Crestor 20mg   Orders: -     Lipid panel  Hematuria, unspecified type -     Urine Culture     Meds ordered this encounter  Medications   fluconazole (DIFLUCAN) 150 MG tablet    Sig: Take 1 tablet (150 mg total) by mouth daily.    Dispense:  1 tablet    Refill:  1    Orders Placed This Encounter  Procedures   Urine Culture   CBC with Differential/Platelet   Comprehensive metabolic panel   Hemoglobin  A1c   Lipid panel   T4, free   TSH   VITAMIN D 25 Hydroxy (Vit-D Deficiency, Fractures)   POCT URINALYSIS DIP (CLINITEK)     Follow-up: Return if symptoms worsen or fail to improve.  An After Visit Summary was printed and given to the patient.  Langley Gauss, [place] [facility] [phone]

## 2023-07-31 NOTE — Assessment & Plan Note (Signed)
Prescribed Diflucan 150mg  Got UA to rule out infection

## 2023-07-31 NOTE — Assessment & Plan Note (Addendum)
Continue monitoring symptoms Will adjust treatment depending on symptoms and BP

## 2023-08-01 LAB — CBC WITH DIFFERENTIAL/PLATELET
Basophils Absolute: 0 10*3/uL (ref 0.0–0.2)
Basos: 0 %
EOS (ABSOLUTE): 0.2 10*3/uL (ref 0.0–0.4)
Eos: 2 %
Hematocrit: 45.6 % (ref 34.0–46.6)
Hemoglobin: 15.2 g/dL (ref 11.1–15.9)
Immature Grans (Abs): 0 10*3/uL (ref 0.0–0.1)
Immature Granulocytes: 0 %
Lymphocytes Absolute: 2.6 10*3/uL (ref 0.7–3.1)
Lymphs: 28 %
MCH: 30.3 pg (ref 26.6–33.0)
MCHC: 33.3 g/dL (ref 31.5–35.7)
MCV: 91 fL (ref 79–97)
Monocytes Absolute: 0.5 10*3/uL (ref 0.1–0.9)
Monocytes: 5 %
Neutrophils Absolute: 5.8 10*3/uL (ref 1.4–7.0)
Neutrophils: 65 %
Platelets: 266 10*3/uL (ref 150–450)
RBC: 5.02 x10E6/uL (ref 3.77–5.28)
RDW: 12 % (ref 11.7–15.4)
WBC: 9.2 10*3/uL (ref 3.4–10.8)

## 2023-08-01 LAB — LIPID PANEL
Chol/HDL Ratio: 5.3 {ratio} — ABNORMAL HIGH (ref 0.0–4.4)
Cholesterol, Total: 233 mg/dL — ABNORMAL HIGH (ref 100–199)
HDL: 44 mg/dL (ref >39)
LDL Chol Calc (NIH): 144 mg/dL — ABNORMAL HIGH (ref 0–99)
Triglycerides: 250 mg/dL — ABNORMAL HIGH (ref 0–149)
VLDL Cholesterol Cal: 45 mg/dL — ABNORMAL HIGH (ref 5–40)

## 2023-08-01 LAB — COMPREHENSIVE METABOLIC PANEL
ALT: 38 [IU]/L — ABNORMAL HIGH (ref 0–32)
AST: 32 [IU]/L (ref 0–40)
Albumin: 4.2 g/dL (ref 3.8–4.9)
Alkaline Phosphatase: 113 [IU]/L (ref 44–121)
BUN/Creatinine Ratio: 16 (ref 9–23)
BUN: 11 mg/dL (ref 6–24)
Bilirubin Total: 0.3 mg/dL (ref 0.0–1.2)
CO2: 23 mmol/L (ref 20–29)
Calcium: 9.6 mg/dL (ref 8.7–10.2)
Chloride: 100 mmol/L (ref 96–106)
Creatinine, Ser: 0.67 mg/dL (ref 0.57–1.00)
Globulin, Total: 2.8 g/dL (ref 1.5–4.5)
Glucose: 132 mg/dL — ABNORMAL HIGH (ref 70–99)
Potassium: 4.5 mmol/L (ref 3.5–5.2)
Sodium: 140 mmol/L (ref 134–144)
Total Protein: 7 g/dL (ref 6.0–8.5)
eGFR: 103 mL/min/{1.73_m2} (ref >59)

## 2023-08-01 LAB — HEMOGLOBIN A1C
Est. average glucose Bld gHb Est-mCnc: 240 mg/dL
Hgb A1c MFr Bld: 10 % — ABNORMAL HIGH (ref 4.8–5.6)

## 2023-08-01 LAB — VITAMIN D 25 HYDROXY (VIT D DEFICIENCY, FRACTURES): Vit D, 25-Hydroxy: 32.7 ng/mL (ref 30.0–100.0)

## 2023-08-01 LAB — T4, FREE: Free T4: 0.88 ng/dL (ref 0.82–1.77)

## 2023-08-01 LAB — TSH: TSH: 1.36 u[IU]/mL (ref 0.450–4.500)

## 2023-08-02 ENCOUNTER — Ambulatory Visit: Payer: BC Managed Care – PPO | Admitting: Physician Assistant

## 2023-08-05 LAB — URINE CULTURE

## 2023-08-06 ENCOUNTER — Ambulatory Visit: Payer: BC Managed Care – PPO | Admitting: Physician Assistant

## 2023-08-07 ENCOUNTER — Other Ambulatory Visit: Payer: Self-pay | Admitting: Physician Assistant

## 2023-08-07 DIAGNOSIS — B3731 Acute candidiasis of vulva and vagina: Secondary | ICD-10-CM

## 2023-08-07 DIAGNOSIS — N3001 Acute cystitis with hematuria: Secondary | ICD-10-CM

## 2023-08-07 MED ORDER — AMOXICILLIN 875 MG PO TABS
875.0000 mg | ORAL_TABLET | Freq: Two times a day (BID) | ORAL | 0 refills | Status: AC
Start: 2023-08-07 — End: 2023-08-12

## 2023-08-07 MED ORDER — FLUCONAZOLE 150 MG PO TABS
150.0000 mg | ORAL_TABLET | Freq: Every day | ORAL | 1 refills | Status: DC
Start: 2023-08-07 — End: 2023-09-24

## 2023-08-10 ENCOUNTER — Ambulatory Visit: Payer: BC Managed Care – PPO | Admitting: Physician Assistant

## 2023-08-10 ENCOUNTER — Encounter: Payer: Self-pay | Admitting: Physician Assistant

## 2023-08-10 VITALS — BP 108/70 | HR 74 | Temp 97.4°F | Resp 14 | Ht 70.5 in | Wt 295.0 lb

## 2023-08-10 DIAGNOSIS — E1165 Type 2 diabetes mellitus with hyperglycemia: Secondary | ICD-10-CM

## 2023-08-10 DIAGNOSIS — I10 Essential (primary) hypertension: Secondary | ICD-10-CM

## 2023-08-10 DIAGNOSIS — F419 Anxiety disorder, unspecified: Secondary | ICD-10-CM | POA: Diagnosis not present

## 2023-08-10 HISTORY — DX: Type 2 diabetes mellitus with hyperglycemia: E11.65

## 2023-08-10 MED ORDER — TIRZEPATIDE 2.5 MG/0.5ML ~~LOC~~ SOAJ
2.5000 mg | SUBCUTANEOUS | 0 refills | Status: DC
Start: 2023-08-10 — End: 2023-09-04

## 2023-08-10 NOTE — Assessment & Plan Note (Signed)
Controlled Continues to grieve for the passing of her dad Will continue to monitor symptoms Will let us know if they become excessive.

## 2023-08-10 NOTE — Assessment & Plan Note (Addendum)
Well controlled.  Continue to work on eating a healthy diet and exercise.  Labs drawn at previous visit No major side effects reported, and no issues with compliance. The current medical regimen is effective;  continue present plan with Irbesartan 75mg  Will adjust medication as needed BP Readings from Last 3 Encounters:  08/10/23 108/70  07/31/23 130/80  05/15/23 120/80

## 2023-08-10 NOTE — Assessment & Plan Note (Addendum)
Uncontrolled Labs drawn at last visit.  Continue taking Metformin 1000mg , Farxiga 5mg  Prescribed Mounjaro 2.5mg  Will draw labs at next visit Will send zofran if she begins to have nausea Will titrate monthly to efficacy

## 2023-08-10 NOTE — Progress Notes (Signed)
Subjective:  Patient ID: Holly [name], female    DOB: 1966/02/25  Age: 57 y.o. MRN: [id number]  Chief Complaint  Patient presents with   Weight Loss    HPI   Patient is concerned about her sugars. She mentioned that she had some hypoglycemia and her A1C was 10% on 07/31/2023. She would like to try [facility] if she this help her to control better her sugar. She states she is unsure as to what could be causing her sugars to be so elevated. Has tried to monitor her diet and increase her exercise. She denies having any vision changes, abdominal pain, urinary symptoms, flank pain, decreased sensation in her feet. She has not tried an injectable medicine before. Is wanting to try one now to help keep her A1C better controlled. She agrees to only stay on 1 injectable medicine and is agreeable to start on Mounjaro. She does not plan on having any children soon and had a hysterectomy so she is unable to have children.      12/08/2022   11:39 AM 11/09/2022    3:01 PM 11/09/2022    2:37 PM 09/05/2022   10:37 AM 02/03/2021    7:50 AM  Depression screen PHQ 2/9  Decreased Interest 3  3 1 1   Down, Depressed, Hopeless 2  3 2 1   PHQ - 2 Score 5  6 3 2   Altered sleeping 1  3 3 2   Tired, decreased energy 2  3 3 3   Change in appetite 2 -- 3 3 2   Feeling bad or failure about yourself  0  0 2   Trouble concentrating 2  2 1 3   Moving slowly or fidgety/restless 2  0 0 0  Suicidal thoughts   0 0 0  PHQ-9 Score 14  17 15 12   Difficult doing work/chores Very difficult  Extremely dIfficult Somewhat difficult Very difficult        05/15/2023   11:13 AM  Fall Risk   Falls in the past year? 0  Number falls in past yr: 0  Injury with Fall? 0  Risk for fall due to : No Fall Risks  Follow up Falls evaluation completed    Patient Care Team: Langley Gauss, [place] as PCP - General (Physician Assistant)   Review of Systems  Constitutional:  Negative for chills, fatigue and fever.  HENT:  Negative for congestion, ear  pain and sore throat.   Respiratory:  Negative for cough and shortness of breath.   Cardiovascular:  Negative for chest pain and palpitations.  Gastrointestinal:  Negative for abdominal pain, constipation, diarrhea, nausea and vomiting.  Genitourinary:  Negative for difficulty urinating and dysuria.  Musculoskeletal:  Negative for arthralgias, back pain and myalgias.  Skin:  Negative for rash.  Neurological:  Negative for dizziness and headaches.  Psychiatric/Behavioral:  Negative for dysphoric mood.     Current Outpatient Medications on File Prior to Visit  Medication Sig Dispense Refill   albuterol (VENTOLIN HFA) 108 (90 Base) MCG/ACT inhaler Inhale 2 puffs into the lungs every 6 (six) hours as needed for wheezing or shortness of breath. 18 g 2   amoxicillin (AMOXIL) 875 MG tablet Take 1 tablet (875 mg total) by mouth 2 (two) times daily for 5 days. 10 tablet 0   budesonide-formoterol (SYMBICORT) 160-4.5 MCG/ACT inhaler Inhale 2 puffs into the lungs 2 (two) times daily. 1 each 3   buPROPion (WELLBUTRIN XL) 150 MG 24 hr tablet TAKE 1 TABLET BY MOUTH EVERY DAY  90 tablet 0   busPIRone (BUSPAR) 7.5 MG tablet TAKE 1 TABLET BY MOUTH TWICE A DAY 180 tablet 1   clonazePAM (KLONOPIN) 0.5 MG tablet Take 1 tablet (0.5 mg total) by mouth 2 (two) times daily as needed for anxiety. 10 tablet 1   cyclobenzaprine (FLEXERIL) 5 MG tablet Take 1 tablet (5 mg total) by mouth 3 (three) times daily as needed for muscle spasms. 60 tablet 1   dapagliflozin propanediol (FARXIGA) 5 MG TABS tablet TAKE 1 TABLET BY MOUTH DAILY BEFORE BREAKFAST. 30 tablet 1   famotidine (PEPCID) 40 MG tablet TAKE 1 TABLET BY MOUTH TWICE A DAY 180 tablet 1   fluconazole (DIFLUCAN) 150 MG tablet Take 1 tablet (150 mg total) by mouth daily. 1 tablet 1   fluticasone (FLONASE) 50 MCG/ACT nasal spray Place 2 sprays into both nostrils daily. 16 g 6   gabapentin (NEURONTIN) 300 MG capsule TAKE 2 CAPSULES BY MOUTH 2 TIMES DAILY. 360 capsule 0    icosapent Ethyl (VASCEPA) 1 g capsule Take 2 capsules (2 g total) by mouth 2 (two) times daily. 120 capsule 2   irbesartan (AVAPRO) 75 MG tablet TAKE 1 TABLET BY MOUTH EVERY DAY 90 tablet 0   levothyroxine (SYNTHROID) 25 MCG tablet TAKE 1 TABLET BY MOUTH EVERY DAY BEFORE BREAKFAST 90 tablet 0   loratadine (CLARITIN) 10 MG tablet TAKE 1 TABLET BY MOUTH EVERY DAY 90 tablet 0   meloxicam (MOBIC) 15 MG tablet TAKE 1 TABLET (15 MG TOTAL) BY MOUTH DAILY. 30 tablet 0   metFORMIN (GLUCOPHAGE) 1000 MG tablet Take 1 tablet (1,000 mg total) by mouth 2 (two) times daily with a meal. 180 tablet 2   mometasone (NASONEX) 50 MCG/ACT nasal spray Place 2 sprays into the nose daily. 17 g 12   omeprazole (PRILOSEC) 40 MG capsule 1 (ONE) CAPSULE EVERY MORNING 30 MINUTES BEFORE BREAKFAST 90 capsule 1   rosuvastatin (CRESTOR) 20 MG tablet Take 1 tablet (20 mg total) by mouth daily. 90 tablet 1   SODIUM FLUORIDE 5000 PPM 1.1 % PSTE See admin instructions.     traZODone (DESYREL) 150 MG tablet TAKE 1 TABLET BY MOUTH EVERYDAY AT BEDTIME 90 tablet 0   venlafaxine XR (EFFEXOR-XR) 75 MG 24 hr capsule TAKE 3 CAPSULES BY MOUTH DAILY IN THE MORNING 270 capsule 1   Vitamin D, Ergocalciferol, (DRISDOL) 1.25 MG (50000 UNIT) CAPS capsule Take 1 capsule (50,000 Units total) by mouth every 7 (seven) days. 5 capsule 2   No current facility-administered medications on file prior to visit.   Past Medical History:  Diagnosis Date   Anxiety    COVID-19 virus detected 12/30/2019   10/30/2019-SARS-CoV-2-positive Didn't require hospitalization     Depression    Diabetes mellitus without complication (HCC)    Fatty liver    Goiter    Hashimoto's thyroiditis    Hilar adenopathy 03/20/19  04/11/19   CTA CHEST and PET per DR. Anne Ng [facility]   History of kidney stones    Hypertension    Mediastinal adenopathy    PER CTA CHEST PET SCAN per DR. Lanae Crumbly [facility]   Pneumonia    Supraclavicular adenopathy 03/20/2019   PER CTA CHEST and PET per  DR. Sudie Bailey [facility]   Past Surgical History:  Procedure Laterality Date   ABDOMINAL HYSTERECTOMY     COMPLETE   ADHESIOLYSIS     APPENDECTOMY     CESAREAN SECTION     MEDIASTINOSCOPY N/A 04/18/2019   Procedure: MEDIASTINOSCOPY;  Surgeon: Sheliah Plane  B, MD;  Location: [facility] OR;  Service: Thoracic;  Laterality: N/A;   VIDEO BRONCHOSCOPY WITH ENDOBRONCHIAL ULTRASOUND N/A 04/18/2019   Procedure: VIDEO BRONCHOSCOPY WITH ENDOBRONCHIAL ULTRASOUND;  Surgeon: Delight Ovens, MD;  Location: [facility] OR;  Service: Thoracic;  Laterality: N/A;    Family History  Problem Relation Age of Onset   Breast cancer Mother    Anuerysm Mother        BRAIN   Cancer Mother        BREAST   COPD Father    Hyperlipidemia Father    Congestive Heart Failure Father    Bipolar disorder Sister    Diabetes Sister    Fibromyalgia Sister    Cancer Paternal Uncle        LUNG   Breast cancer Maternal Grandmother    Cancer Maternal Grandmother 35       BREAST   Cancer Paternal Grandfather        LUNG   Social History   Socioeconomic History   Marital status: Divorced    Spouse name: Not on file   Number of children: Not on file   Years of education: Not on file   Highest education level: Not on file  Occupational History   Not on file  Tobacco Use   Smoking status: Never   Smokeless tobacco: Never  Vaping Use   Vaping status: Never Used  Substance and Sexual Activity   Alcohol use: Yes    Comment: VERY RARE OCCASION   Drug use: Never   Sexual activity: Not Currently  Other Topics Concern   Not on file  Social History Narrative   Not on file   Social Determinants of Health   Financial Resource Strain: Low Risk  (12/08/2022)   Overall Financial Resource Strain (CARDIA)    Difficulty of Paying Living Expenses: Not hard at all  Food Insecurity: No Food Insecurity (12/08/2022)   Hunger Vital Sign    Worried About Running Out of Food in the Last Year: Never true    Ran Out of Food in the Last Year:  Never true  Transportation Needs: No Transportation Needs (12/08/2022)   PRAPARE - Administrator, Civil Service (Medical): No    Lack of Transportation (Non-Medical): No  Physical Activity: Inactive (12/08/2022)   Exercise Vital Sign    Days of Exercise per Week: 0 days    Minutes of Exercise per Session: 0 min  Stress: No Stress Concern Present (12/08/2022)   [employer] of Occupational Health - Occupational Stress Questionnaire    Feeling of Stress : Not at all  Social Connections: Moderately Isolated (12/08/2022)   Social Connection and Isolation Panel [NHANES]    Frequency of Communication with Friends and Family: More than three times a week    Frequency of Social Gatherings with Friends and Family: Once a week    Attends Religious Services: More than 4 times per year    Active Member of [employer] or Organizations: No    Attends Engineer, structural: Never    Marital Status: Divorced    Objective:  BP 108/70   Pulse 74   Temp (!) 97.4 F (36.3 C)   Resp 14   Ht 5' 10.5" (1.791 m)   Wt 295 lb (133.8 kg)   SpO2 97%   BMI 41.73 kg/m      08/10/2023   10:14 AM 07/31/2023    1:52 PM 05/15/2023   11:10 AM  BP/Weight  Systolic BP  108 130 120  Diastolic BP 70 80 80  Wt. (Lbs) 295 300.4 304  BMI 41.73 kg/m2 42.49 kg/m2 43 kg/m2    Physical Exam Vitals reviewed.  Constitutional:      Appearance: Normal appearance.  Cardiovascular:     Rate and Rhythm: Normal rate and regular rhythm.     Heart sounds: Normal heart sounds.  Pulmonary:     Effort: Pulmonary effort is normal.     Breath sounds: Normal breath sounds.  Abdominal:     General: Bowel sounds are normal.     Palpations: Abdomen is soft.     Tenderness: There is no abdominal tenderness.  Neurological:     Mental Status: She is alert and oriented to person, place, and time.  Psychiatric:        Mood and Affect: Mood normal.        Behavior: Behavior normal.     Diabetic Foot Exam -  Simple   No data filed      Lab Results  Component Value Date   WBC 9.2 07/31/2023   HGB 15.2 07/31/2023   HCT 45.6 07/31/2023   PLT 266 07/31/2023   GLUCOSE 132 (H) 07/31/2023   CHOL 233 (H) 07/31/2023   TRIG 250 (H) 07/31/2023   HDL 44 07/31/2023   LDLCALC 144 (H) 07/31/2023   ALT 38 (H) 07/31/2023   AST 32 07/31/2023   NA 140 07/31/2023   K 4.5 07/31/2023   CL 100 07/31/2023   CREATININE 0.67 07/31/2023   BUN 11 07/31/2023   CO2 23 07/31/2023   TSH 1.360 07/31/2023   INR 1.0 04/16/2019   HGBA1C 10.0 (H) 07/31/2023   MICROALBUR 80 08/15/2020      Assessment & Plan:    Type 2 diabetes mellitus with hyperglycemia, without long-term current use of insulin (HCC) Assessment & Plan: Uncontrolled Labs drawn at last visit.  Continue taking Metformin 1000mg , Farxiga 5mg  Prescribed Mounjaro 2.5mg  Will draw labs at next visit Will send zofran if she begins to have nausea Will titrate monthly to efficacy  Orders: -     Tirzepatide; Inject 2.5 mg into the skin once a week.  Dispense: 2 mL; Refill: 0  Anxiety Assessment & Plan: Controlled Continues to grieve for the passing of her dad Will continue to monitor symptoms Will let us know if they become excessive.    Primary hypertension Assessment & Plan: Well controlled.  Continue to work on eating a healthy diet and exercise.  Labs drawn at previous visit No major side effects reported, and no issues with compliance. The current medical regimen is effective;  continue present plan with Irbesartan 75mg  Will adjust medication as needed BP Readings from Last 3 Encounters:  08/10/23 108/70  07/31/23 130/80  05/15/23 120/80         Meds ordered this encounter  Medications   tirzepatide (MOUNJARO) 2.5 MG/0.5ML Pen    Sig: Inject 2.5 mg into the skin once a week.    Dispense:  2 mL    Refill:  0    No orders of the defined types were placed in this encounter.    Follow-up: Return in about 3 months  (around 11/10/2023) for Chronic, fasting, Huston Foley.   I,Marla I Leal-Borjas,acting as a scribe for [employer], PA.,have documented all relevant documentation on the behalf of Langley Gauss, PA,as directed by  Langley Gauss, PA while in the presence of Langley Gauss, [place].   An After Visit Summary was printed and given to  the patient.  Langley Gauss, [place] [facility] [phone]

## 2023-08-14 ENCOUNTER — Other Ambulatory Visit: Payer: Self-pay | Admitting: Family Medicine

## 2023-08-21 ENCOUNTER — Encounter: Payer: Self-pay | Admitting: Physician Assistant

## 2023-09-04 ENCOUNTER — Other Ambulatory Visit: Payer: Self-pay | Admitting: Physician Assistant

## 2023-09-04 DIAGNOSIS — E1165 Type 2 diabetes mellitus with hyperglycemia: Secondary | ICD-10-CM

## 2023-09-07 ENCOUNTER — Other Ambulatory Visit: Payer: Self-pay | Admitting: Family Medicine

## 2023-09-07 DIAGNOSIS — F331 Major depressive disorder, recurrent, moderate: Secondary | ICD-10-CM

## 2023-09-12 ENCOUNTER — Ambulatory Visit: Payer: BC Managed Care – PPO | Admitting: Physician Assistant

## 2023-09-12 ENCOUNTER — Encounter: Payer: Self-pay | Admitting: Physician Assistant

## 2023-09-12 VITALS — BP 120/70 | HR 94 | Temp 98.7°F | Resp 16 | Ht 70.5 in | Wt 292.2 lb

## 2023-09-12 DIAGNOSIS — M25512 Pain in left shoulder: Secondary | ICD-10-CM | POA: Diagnosis not present

## 2023-09-12 DIAGNOSIS — E1165 Type 2 diabetes mellitus with hyperglycemia: Secondary | ICD-10-CM | POA: Diagnosis not present

## 2023-09-12 HISTORY — DX: Pain in left shoulder: M25.512

## 2023-09-12 NOTE — Assessment & Plan Note (Signed)
Patient is currently on Metformin and reports no significant symptoms. -Continue Metformin as prescribed. -Check blood sugar levels at the end of January.

## 2023-09-12 NOTE — Progress Notes (Signed)
Acute Office Visit  Subjective:    Patient ID: Holly [name], female    DOB: [birth year], 57 y.o.   MRN: [id number]  Chief Complaint  Patient presents with   Shoulder Pain    HPI: Discussed the use of AI scribe software for clinical note transcription with the patient, who gave verbal consent to proceed.  History of Present Illness   The patient, with a history of diabetes, presents with worsening left shoulder pain over the past month. The pain is described as an unbearable, throbbing ache that is most severe when lying on the right side with the left shoulder elevated. The pain originates in the shoulder and radiates down the back of the arm. The patient denies any recent falls or trauma. The pain is not exacerbated by movement or resistance, but is most severe when the arm is at rest, hanging by the side. The patient has been managing the pain with muscle relaxers prescribed for a previous back injury and high-dose Advil, which only provides partial relief. The patient denies any weakness or dropping objects.       Past Medical History:  Diagnosis Date   Anxiety    COVID-19 virus detected 12/30/2019   10/30/2019-SARS-CoV-2-positive Didn't require hospitalization     Depression    Diabetes mellitus without complication (HCC)    Fatty liver    Goiter    Hashimoto's thyroiditis    Hilar adenopathy 03/20/19  04/11/19   CTA CHEST and PET per DR. Anne Ng [facility]   History of kidney stones    Hypertension    Mediastinal adenopathy    PER CTA CHEST PET SCAN per DR. Lanae Crumbly LEWIS   Pneumonia    Supraclavicular adenopathy 03/20/2019   PER CTA CHEST and PET per DR. Sudie Bailey LEWIS    Past Surgical History:  Procedure Laterality Date   ABDOMINAL HYSTERECTOMY     COMPLETE   ADHESIOLYSIS     APPENDECTOMY     CESAREAN SECTION     MEDIASTINOSCOPY N/A 04/18/2019   Procedure: MEDIASTINOSCOPY;  Surgeon: Delight Ovens, MD;  Location: [facility] OR;  Service: Thoracic;  Laterality: N/A;   VIDEO  BRONCHOSCOPY WITH ENDOBRONCHIAL ULTRASOUND N/A 04/18/2019   Procedure: VIDEO BRONCHOSCOPY WITH ENDOBRONCHIAL ULTRASOUND;  Surgeon: Delight Ovens, MD;  Location: [facility] OR;  Service: Thoracic;  Laterality: N/A;    Family History  Problem Relation Age of Onset   Breast cancer Mother    Anuerysm Mother        BRAIN   Cancer Mother        BREAST   COPD Father    Hyperlipidemia Father    Congestive Heart Failure Father    Bipolar disorder Sister    Diabetes Sister    Fibromyalgia Sister    Cancer Paternal Uncle        LUNG   Breast cancer Maternal Grandmother    Cancer Maternal Grandmother 58       BREAST   Cancer Paternal Grandfather        LUNG    Social History   Socioeconomic History   Marital status: Divorced    Spouse name: Not on file   Number of children: Not on file   Years of education: Not on file   Highest education level: Not on file  Occupational History   Not on file  Tobacco Use   Smoking status: Never   Smokeless tobacco: Never  Vaping Use   Vaping status: Never Used  Substance and Sexual  Activity   Alcohol use: Yes    Comment: VERY RARE OCCASION   Drug use: Never   Sexual activity: Not Currently  Other Topics Concern   Not on file  Social History Narrative   Not on file   Social Determinants of Health   Financial Resource Strain: Low Risk  (12/08/2022)   Overall Financial Resource Strain (CARDIA)    Difficulty of Paying Living Expenses: Not hard at all  Food Insecurity: No Food Insecurity (12/08/2022)   Hunger Vital Sign    Worried About Running Out of Food in the Last Year: Never true    Ran Out of Food in the Last Year: Never true  Transportation Needs: No Transportation Needs (12/08/2022)   PRAPARE - Administrator, Civil Service (Medical): No    Lack of Transportation (Non-Medical): No  Physical Activity: Inactive (12/08/2022)   Exercise Vital Sign    Days of Exercise per Week: 0 days    Minutes of Exercise per Session: 0 min   Stress: No Stress Concern Present (12/08/2022)   [employer] of Occupational Health - Occupational Stress Questionnaire    Feeling of Stress : Not at all  Social Connections: Moderately Isolated (12/08/2022)   Social Connection and Isolation Panel [NHANES]    Frequency of Communication with Friends and Family: More than three times a week    Frequency of Social Gatherings with Friends and Family: Once a week    Attends Religious Services: More than 4 times per year    Active Member of [employer] or Organizations: No    Attends Banker Meetings: Never    Marital Status: Divorced  Catering manager Violence: Not At Risk (12/08/2022)   Humiliation, Afraid, Rape, and Kick questionnaire    Fear of Current or Ex-Partner: No    Emotionally Abused: No    Physically Abused: No    Sexually Abused: No    Outpatient Medications Prior to Visit  Medication Sig Dispense Refill   albuterol (VENTOLIN HFA) 108 (90 Base) MCG/ACT inhaler Inhale 2 puffs into the lungs every 6 (six) hours as needed for wheezing or shortness of breath. 18 g 2   budesonide-formoterol (SYMBICORT) 160-4.5 MCG/ACT inhaler Inhale 2 puffs into the lungs 2 (two) times daily. 1 each 3   buPROPion (WELLBUTRIN XL) 150 MG 24 hr tablet TAKE 1 TABLET BY MOUTH EVERY DAY 90 tablet 0   busPIRone (BUSPAR) 7.5 MG tablet TAKE 1 TABLET BY MOUTH TWICE A DAY 180 tablet 1   clonazePAM (KLONOPIN) 0.5 MG tablet Take 1 tablet (0.5 mg total) by mouth 2 (two) times daily as needed for anxiety. 10 tablet 1   cyclobenzaprine (FLEXERIL) 5 MG tablet Take 1 tablet (5 mg total) by mouth 3 (three) times daily as needed for muscle spasms. 60 tablet 1   famotidine (PEPCID) 40 MG tablet TAKE 1 TABLET BY MOUTH TWICE A DAY 180 tablet 1   FARXIGA 5 MG TABS tablet TAKE 1 TABLET BY MOUTH EVERY DAY BEFORE BREAKFAST 90 tablet 1   fluconazole (DIFLUCAN) 150 MG tablet Take 1 tablet (150 mg total) by mouth daily. 1 tablet 1   fluticasone (FLONASE) 50 MCG/ACT  nasal spray Place 2 sprays into both nostrils daily. 16 g 6   gabapentin (NEURONTIN) 300 MG capsule TAKE 2 CAPSULES BY MOUTH 2 TIMES DAILY. 360 capsule 0   irbesartan (AVAPRO) 75 MG tablet TAKE 1 TABLET BY MOUTH EVERY DAY 90 tablet 0   levothyroxine (SYNTHROID) 25 MCG tablet TAKE  1 TABLET BY MOUTH EVERY DAY BEFORE BREAKFAST 90 tablet 0   metFORMIN (GLUCOPHAGE) 1000 MG tablet Take 1 tablet (1,000 mg total) by mouth 2 (two) times daily with a meal. 180 tablet 2   mometasone (NASONEX) 50 MCG/ACT nasal spray Place 2 sprays into the nose daily. 17 g 12   omeprazole (PRILOSEC) 40 MG capsule 1 (ONE) CAPSULE EVERY MORNING 30 MINUTES BEFORE BREAKFAST 90 capsule 1   rosuvastatin (CRESTOR) 20 MG tablet Take 1 tablet (20 mg total) by mouth daily. 90 tablet 1   SODIUM FLUORIDE 5000 PPM 1.1 % PSTE See admin instructions.     tirzepatide (MOUNJARO) 2.5 MG/0.5ML Pen INJECT 2.5 MG SUBCUTANEOUSLY WEEKLY 2 mL 0   traZODone (DESYREL) 150 MG tablet TAKE 1 TABLET BY MOUTH EVERYDAY AT BEDTIME 90 tablet 0   venlafaxine XR (EFFEXOR-XR) 75 MG 24 hr capsule TAKE 3 CAPSULES BY MOUTH DAILY IN THE MORNING 270 capsule 1   Vitamin D, Ergocalciferol, (DRISDOL) 1.25 MG (50000 UNIT) CAPS capsule Take 1 capsule (50,000 Units total) by mouth every 7 (seven) days. (Patient not taking: Reported on 09/12/2023) 5 capsule 2   icosapent Ethyl (VASCEPA) 1 g capsule Take 2 capsules (2 g total) by mouth 2 (two) times daily. 120 capsule 2   loratadine (CLARITIN) 10 MG tablet TAKE 1 TABLET BY MOUTH EVERY DAY 90 tablet 0   meloxicam (MOBIC) 15 MG tablet TAKE 1 TABLET (15 MG TOTAL) BY MOUTH DAILY. 30 tablet 0   No facility-administered medications prior to visit.    Allergies  Allergen Reactions   Moxifloxacin Anaphylaxis, Other (See Comments) and Hives   Avelox [Moxifloxacin Hcl In Nacl]    Cymbalta [Duloxetine Hcl]    Duloxetine Hives   Oxycodone-Acetaminophen Hives   Rybrevant [Amivantamab-Vmjw]     Review of Systems   Constitutional:  Negative for chills, fatigue and fever.  HENT:  Negative for congestion, ear pain and sore throat.   Respiratory:  Negative for cough and shortness of breath.   Cardiovascular:  Negative for chest pain and palpitations.  Gastrointestinal:  Negative for abdominal pain, constipation, diarrhea, nausea and vomiting.  Genitourinary:  Negative for difficulty urinating and dysuria.  Musculoskeletal:  Positive for arthralgias and myalgias. Negative for back pain.  Skin:  Negative for rash.  Neurological:  Negative for dizziness and headaches.  Psychiatric/Behavioral:  Negative for dysphoric mood.        Objective:        09/12/2023    4:31 PM 08/10/2023   10:14 AM 07/31/2023    1:52 PM  Vitals with BMI  Height 5' 10.5" 5' 10.5" 5' 10.5"  Weight 292 lbs 3 oz 295 lbs 300 lbs 6 oz  BMI 41.32 41.72 42.48  Systolic 120 108 010  Diastolic 70 70 80  Pulse 94 74 88    Orthostatic VS for the past 72 hrs (Last 3 readings):  Patient Position BP Location Cuff Size  09/12/23 1631 Sitting Left Arm Large     Physical Exam Vitals reviewed.  Constitutional:      Appearance: Normal appearance.  Cardiovascular:     Rate and Rhythm: Normal rate and regular rhythm.     Heart sounds: Normal heart sounds.  Pulmonary:     Effort: Pulmonary effort is normal.     Breath sounds: Normal breath sounds.  Abdominal:     General: Bowel sounds are normal.     Palpations: Abdomen is soft.     Tenderness: There is no abdominal tenderness.  Musculoskeletal:        General: Tenderness present. No swelling. Normal range of motion.     Right shoulder: Normal.     Left shoulder: Tenderness present. No swelling or bony tenderness. Normal range of motion. Normal strength.     Right lower leg: No edema.     Left lower leg: No edema.     Comments: Pain on the posterior lateral aspect of the shoulder  Neurological:     Mental Status: She is alert and oriented to person, place, and time.   Psychiatric:        Mood and Affect: Mood normal.        Behavior: Behavior normal.     Health Maintenance Due  Topic Date Due   OPHTHALMOLOGY EXAM  Never done   Zoster Vaccines- Shingrix (2 of 2) 08/05/2021   FOOT EXAM  09/06/2023    There are no preventive care reminders to display for this patient.   Lab Results  Component Value Date   TSH 1.360 07/31/2023   Lab Results  Component Value Date   WBC 9.2 07/31/2023   HGB 15.2 07/31/2023   HCT 45.6 07/31/2023   MCV 91 07/31/2023   PLT 266 07/31/2023   Lab Results  Component Value Date   NA 140 07/31/2023   K 4.5 07/31/2023   CO2 23 07/31/2023   GLUCOSE 132 (H) 07/31/2023   BUN 11 07/31/2023   CREATININE 0.67 07/31/2023   BILITOT 0.3 07/31/2023   ALKPHOS 113 07/31/2023   AST 32 07/31/2023   ALT 38 (H) 07/31/2023   PROT 7.0 07/31/2023   ALBUMIN 4.2 07/31/2023   CALCIUM 9.6 07/31/2023   ANIONGAP 11 04/16/2019   EGFR 103 07/31/2023   GFR 71.87 12/31/2019   Lab Results  Component Value Date   CHOL 233 (H) 07/31/2023   Lab Results  Component Value Date   HDL 44 07/31/2023   Lab Results  Component Value Date   LDLCALC 144 (H) 07/31/2023   Lab Results  Component Value Date   TRIG 250 (H) 07/31/2023   Lab Results  Component Value Date   CHOLHDL 5.3 (H) 07/31/2023   Lab Results  Component Value Date   HGBA1C 10.0 (H) 07/31/2023   Joint Injection/Arthrocentesis  Date/Time: 09/12/2023 5:10 PM  Performed by: Langley Gauss, PA Authorized by: Langley Gauss, PA  Indications: pain  Body area: shoulder Joint: left shoulder Local anesthesia used: yes  Anesthesia: Local anesthesia used: yes Local Anesthetic: co-phenylcaine spray  Sedation: Patient sedated: no  Needle gauge: 23G. Approach: posterior Aspirate: clear Aspirate amount: 1 mL Triamcinolone amount: 40 mg Lidocaine 1% amount: 5 mL Patient tolerance: patient tolerated the procedure well with no immediate complications         Assessment & Plan:  Acute pain of left shoulder Assessment & Plan: Chronic pain for about a month, worsened when laying on right side. Pain originates in the shoulder and radiates down the back of the arm. No recent trauma. Pain is achy and throbbing. Physical examination suggests a strained rotator cuff, specifically the supraspinatus muscle. -Administer steroid and lidocaine injection today for immediate and long-term relief. -Consider physical therapy if pain returns soon after injection.  Orders: -     Arthrocentesis  Type 2 diabetes mellitus with hyperglycemia, without long-term current use of insulin (HCC) Assessment & Plan: Patient is currently on Metformin and reports no significant symptoms. -Continue Metformin as prescribed. -Check blood sugar levels at the end of January.  No orders of the defined types were placed in this encounter.   Orders Placed This Encounter  Procedures   Joint Injection/Arthrocentesis   Assessment and Plan         Follow-up: No follow-ups on file.  An After Visit Summary was printed and given to the patient.  Langley Gauss, [facility] [phone]

## 2023-09-12 NOTE — Assessment & Plan Note (Signed)
Chronic pain for about a month, worsened when laying on right side. Pain originates in the shoulder and radiates down the back of the arm. No recent trauma. Pain is achy and throbbing. Physical examination suggests a strained rotator cuff, specifically the supraspinatus muscle. -Administer steroid and lidocaine injection today for immediate and long-term relief. -Consider physical therapy if pain returns soon after injection.

## 2023-09-15 ENCOUNTER — Encounter: Payer: Self-pay | Admitting: Physician Assistant

## 2023-09-16 DIAGNOSIS — S92231A Displaced fracture of intermediate cuneiform of right foot, initial encounter for closed fracture: Secondary | ICD-10-CM

## 2023-09-16 HISTORY — DX: Displaced fracture of intermediate cuneiform of right foot, initial encounter for closed fracture: S92.231A

## 2023-09-17 ENCOUNTER — Encounter: Payer: Self-pay | Admitting: Physician Assistant

## 2023-09-20 ENCOUNTER — Other Ambulatory Visit: Payer: Self-pay | Admitting: Family Medicine

## 2023-09-20 DIAGNOSIS — I10 Essential (primary) hypertension: Secondary | ICD-10-CM

## 2023-09-24 ENCOUNTER — Ambulatory Visit: Payer: BC Managed Care – PPO | Admitting: Physician Assistant

## 2023-09-24 ENCOUNTER — Encounter: Payer: Self-pay | Admitting: Physician Assistant

## 2023-09-24 ENCOUNTER — Ambulatory Visit: Payer: Self-pay | Admitting: Physician Assistant

## 2023-09-24 VITALS — BP 126/70 | HR 85 | Temp 96.8°F | Resp 16 | Ht 70.5 in | Wt 293.0 lb

## 2023-09-24 DIAGNOSIS — E1165 Type 2 diabetes mellitus with hyperglycemia: Secondary | ICD-10-CM

## 2023-09-24 DIAGNOSIS — L03115 Cellulitis of right lower limb: Secondary | ICD-10-CM

## 2023-09-24 DIAGNOSIS — F331 Major depressive disorder, recurrent, moderate: Secondary | ICD-10-CM | POA: Diagnosis not present

## 2023-09-24 DIAGNOSIS — Z09 Encounter for follow-up examination after completed treatment for conditions other than malignant neoplasm: Secondary | ICD-10-CM | POA: Diagnosis not present

## 2023-09-24 DIAGNOSIS — E782 Mixed hyperlipidemia: Secondary | ICD-10-CM

## 2023-09-24 HISTORY — DX: Encounter for follow-up examination after completed treatment for conditions other than malignant neoplasm: Z09

## 2023-09-24 HISTORY — DX: Cellulitis of right lower limb: L03.115

## 2023-09-24 NOTE — Telephone Encounter (Signed)
Copied from CRM [id number]. Topic: Clinical - Red Word Triage >> Sep 24, 2023  7:47 AM Fonda Kinder J wrote: Red Word that prompted transfer to Nurse Triage: Patient recently was discharged for infected foot, foot is now swelling and has blisters. Patient has follow up appointment today and wanted to make sure it was o for her to still come in   Chief Complaint: pain and drainage of a blister Symptoms: 6-7/10 when not elevated and clear drainage Frequency: since last night 09/22/22 Pertinent Negatives: Patient denies fever and redness Disposition: [] ED /[] Urgent Care (no appt availability in office) / [x] Appointment(In office/virtual)/ []   [facility]/ [] Home Care/ [] Refused Recommended Disposition /[] [facility]/ []  Follow-up with PCP Additional Notes: The patient reported that she had a tack in her foot for several days without realizing it.  She developed a fever and was hospitalized and given antibiotics for an infection that developed due to the injury.  She reported that the antibiotics seem to be working but she went to a church function last night and was up on her feet although she avoided walking on her heel.  She reported clear drainage and worsening pain at the site of the blister.  She was unsure if she should come to her appointment that is scheduled for this morning.  The patient had difficulty describing the appearance of the blister but stated that it did not seem to be red.  She said the car ride will be about 45 minutes.  Advised the patient to take over the counter pain relievers and to keep her appointment so that the hcp can visualize the blister.  She inquired if the blister will be drained.  Forwarded to care team for review.    Reason for Disposition  [1] Severe pain or large blister AND [2] caller wants doctor to drain blister  [1] Ulcer, wound, blister, sore, or red area AND [2] new or increasing  Answer Assessment - Initial Assessment Questions 1. APPEARANCE  of BLISTER: "What does it look like?"     No longer has a red ring around it looks improved; no color in it 2. SIZE: "How large is the blister?" (inches, cm or compare to coins)     3-4 quarters diameter  3. LOCATION: "Where are the blisters located?"      In front of heel not on heel itself  4. WHEN: "When did the blister happen?"     Within the past two weeks 5. CAUSE: "What do you think caused the blister?"     Thumb tack injury  6. PAIN: "Does it hurt?" If Yes, ask: "How bad is the pain?"  (Scale 1-10; or mild, moderate, severe)     Pain is worse when it's not elevated; shooting pain 6-7/10 When it is elevated 3/10   7. OTHER SYMPTOMS: "Do you have any other symptoms?" (e.g., fever)     Clear drainage  On two antibiotics and one makes me groggy  Answer Assessment - Initial Assessment Questions 1. SYMPTOM: "What's the main symptom you're concerned about?" (e.g., rash, sore, callus, drainage, numbness)     Pain and drainage from blister that formed after infection of thumb tack injury 2. LOCATION: "Where is the  blister located?" (e.g., foot/toe, top/bottom, left/right)     In front of the heel not on the heel itself.  I have to walk on the ball of my foot to avoid it 3. APPEARANCE: "What does the area look like?" (e.g., normal, red, swollen; size)  The size of 3 quarters in diameter; the patient was unable to describe the color but said it didn't look like it had much color in it  4. ONSET: "When did the  blister  start?"     Within the last 2 weeks 5. PAIN: "Is there any pain?" If Yes, ask: "How bad is it?" (Scale: 1-10; mild, moderate, severe)     6-7/10 when standing or not elevated 3/10 when elevated 6. CAUSE: "What do you think is causing the symptoms?"     Thumb tack was in foot for several days and the patient did not feel it due to neuropathy in her feet 7. OTHER SYMPTOMS: "Do you have any other symptoms?" (e.g., fever, weakness)     Denied other symptoms states that it  seems to be doing better than it has been but she went to a church function last night and was up on her feet longer than usual and she is having more discomfort today.  She inquired if it is safe to come to the provider since the car ride is 45 minutes.  Protocols used: Blister - Foot and Hand-A-AH, Diabetes - Foot Problems and Questions-A-AH

## 2023-09-24 NOTE — Progress Notes (Signed)
Subjective:  Patient ID: Holly [name], female    DOB: 06/23/1966  Age: 57 y.o. MRN: [id number]  Chief Complaint  Patient presents with   Cellulitis   Transitions Of Care    HPI   Patient was admitted at [facility] on 09/16/23 and discharged on 09/18/23. She was diagnosed with cellulitis of right foot, diabetic polyneuropathy, type 2 diabetes mellitus with hyperglycemia, fracture of intermediate cuneiform bone of right foot. She presented to the emergency department with complaints of a painful right foot. Workup was concerning for cellulitis and possible abscess of the right foot after finding a thumbtack in the plantar surface of her foot. MRI consistent with multifocal reactive tarsal bone edema as well as edema related to chip fracture involving the dorsal aspect of the intermediate cuneiform with overlying soft tissue swelling. Patient received IV antibiotics. Blood cultures are negative x 24 hours. She was discharged with Vantin and Flagyl to complete a 10-day course of antibiotics. She still taking them.  She denies fever, chills, body aches. She noticed "a big blister on the sole of the right foot", and redness is getting better.  Discussed the use of AI scribe software for clinical note transcription with the patient, who gave verbal consent to proceed.  History of Present Illness   A 57 year old female with a history of diabetes and depression presents for a hospital follow-up visit. She had been hospitalized due to a foot infection and a fracture. The patient reports that her condition has improved significantly since her hospitalization. She had been on Flexaril 5mg  Vantin 200mg , which she believes have helped her condition. The patient reports that the fracture was on the top of her foot, where there was a chip missing. She also mentions that she has been experiencing some drainage from the foot, which has a slight red color to it. The patient reports that the foot is still  somewhat tender, but she does not have good feeling in her feet due to her diabetes. She also mentions that the redness on her foot has reduced significantly since the infection. The patient also reports that she had a tack in the bottom of her foot, which she believes caused the infection. She is concerned about the possibility of the wound opening up and causing a reinfection.  In addition to her foot issues, the patient also discusses her mental health. She reports that she has been dealing with a lot of issues in her head, including the loss of her parents and worries about her children. She expresses a desire to return to therapy to address these issues. She also mentions that she has been dealing with a lot of stress and trauma over the past year, which she believes has impacted her physical health, including causing her blood sugar levels to spike.          09/24/2023   10:39 AM 12/08/2022   11:39 AM 11/09/2022    3:01 PM 11/09/2022    2:37 PM 09/05/2022   10:37 AM  Depression screen PHQ 2/9  Decreased Interest 2 3  3 1   Down, Depressed, Hopeless 2 2  3 2   PHQ - 2 Score 4 5  6 3   Altered sleeping 3 1  3 3   Tired, decreased energy 2 2  3 3   Change in appetite 1 2 -- 3 3  Feeling bad or failure about yourself  0 0  0 2  Trouble concentrating 2 2  2 1   Moving slowly or  fidgety/restless 0 2  0 0  Suicidal thoughts 0   0 0  PHQ-9 Score 12 14  17 15   Difficult doing work/chores Very difficult Very difficult  Extremely dIfficult Somewhat difficult        09/24/2023   10:38 AM  Fall Risk   Falls in the past year? 1  Number falls in past yr: 0  Injury with Fall? 1  Risk for fall due to : History of fall(s)  Follow up Education provided    Patient Care Team: Langley Gauss, [place] as PCP - General (Physician Assistant)   Review of Systems  Constitutional:  Negative for chills, fatigue and fever.  HENT:  Negative for congestion, ear pain and sore throat.   Respiratory:  Negative for  cough and shortness of breath.   Cardiovascular:  Negative for chest pain and palpitations.  Gastrointestinal:  Negative for abdominal pain, constipation, diarrhea, nausea and vomiting.  Endocrine: Negative for polydipsia, polyphagia and polyuria.  Genitourinary:  Negative for difficulty urinating and dysuria.  Musculoskeletal:  Positive for arthralgias (right foot pain). Negative for back pain and myalgias.  Skin:  Positive for color change and wound (swollen like a blister). Negative for rash.  Neurological:  Negative for headaches.  Psychiatric/Behavioral:  Negative for dysphoric mood. The patient is not nervous/anxious.     Current Outpatient Medications on File Prior to Visit  Medication Sig Dispense Refill   albuterol (VENTOLIN HFA) 108 (90 Base) MCG/ACT inhaler Inhale 2 puffs into the lungs every 6 (six) hours as needed for wheezing or shortness of breath. 18 g 2   budesonide-formoterol (SYMBICORT) 160-4.5 MCG/ACT inhaler Inhale 2 puffs into the lungs 2 (two) times daily. 1 each 3   buPROPion (WELLBUTRIN XL) 150 MG 24 hr tablet TAKE 1 TABLET BY MOUTH EVERY DAY 90 tablet 0   busPIRone (BUSPAR) 7.5 MG tablet TAKE 1 TABLET BY MOUTH TWICE A DAY 180 tablet 1   cefpodoxime (VANTIN) 200 MG tablet Take by mouth.     clonazePAM (KLONOPIN) 0.5 MG tablet Take 1 tablet (0.5 mg total) by mouth 2 (two) times daily as needed for anxiety. 10 tablet 1   cyclobenzaprine (FLEXERIL) 5 MG tablet Take 1 tablet (5 mg total) by mouth 3 (three) times daily as needed for muscle spasms. 60 tablet 1   famotidine (PEPCID) 40 MG tablet TAKE 1 TABLET BY MOUTH TWICE A DAY 180 tablet 1   FARXIGA 5 MG TABS tablet TAKE 1 TABLET BY MOUTH EVERY DAY BEFORE BREAKFAST 90 tablet 1   fluticasone (FLONASE) 50 MCG/ACT nasal spray Place 2 sprays into both nostrils daily. 16 g 6   gabapentin (NEURONTIN) 300 MG capsule TAKE 2 CAPSULES BY MOUTH 2 TIMES DAILY. 360 capsule 0   irbesartan (AVAPRO) 75 MG tablet TAKE 1 TABLET BY MOUTH  EVERY DAY 90 tablet 0   levothyroxine (SYNTHROID) 25 MCG tablet TAKE 1 TABLET BY MOUTH EVERY DAY BEFORE BREAKFAST 90 tablet 0   metFORMIN (GLUCOPHAGE) 1000 MG tablet Take 1 tablet (1,000 mg total) by mouth 2 (two) times daily with a meal. 180 tablet 2   metroNIDAZOLE (FLAGYL) 500 MG tablet Take by mouth.     mometasone (NASONEX) 50 MCG/ACT nasal spray Place 2 sprays into the nose daily. 17 g 12   Multiple Vitamin (MULTI-VITAMIN) tablet Take 1 tablet by mouth daily.     omeprazole (PRILOSEC) 40 MG capsule 1 (ONE) CAPSULE EVERY MORNING 30 MINUTES BEFORE BREAKFAST 90 capsule 1   rosuvastatin (CRESTOR) 20 MG  tablet Take 1 tablet (20 mg total) by mouth daily. 90 tablet 1   SODIUM FLUORIDE 5000 PPM 1.1 % PSTE See admin instructions.     tirzepatide (MOUNJARO) 2.5 MG/0.5ML Pen INJECT 2.5 MG SUBCUTANEOUSLY WEEKLY 2 mL 0   traZODone (DESYREL) 150 MG tablet TAKE 1 TABLET BY MOUTH EVERYDAY AT BEDTIME 90 tablet 0   venlafaxine XR (EFFEXOR-XR) 75 MG 24 hr capsule TAKE 3 CAPSULES BY MOUTH DAILY IN THE MORNING 270 capsule 1   Vitamin D, Ergocalciferol, (DRISDOL) 1.25 MG (50000 UNIT) CAPS capsule Take 1 capsule (50,000 Units total) by mouth every 7 (seven) days. 5 capsule 2   No current facility-administered medications on file prior to visit.   Past Medical History:  Diagnosis Date   Anxiety    COVID-19 virus detected 12/30/2019   10/30/2019-SARS-CoV-2-positive Didn't require hospitalization     Depression    Diabetes mellitus without complication (HCC)    Fatty liver    Goiter    Hashimoto's thyroiditis    Hilar adenopathy 03/20/19  04/11/19   CTA CHEST and PET per DR. Anne Ng LEW[facility]   History of kidney stones    Hypertension    Mediastinal adenopathy    PER CTA CHEST PET SCAN per DR. Lanae Crumbly LEWIS   Pneumonia    Supraclavicular adenopathy 03/20/2019   PER CTA CHEST and PET per DR. Sudie Bailey LEWIS   Past Surgical History:  Procedure Laterality Date   ABDOMINAL HYSTERECTOMY     COMPLETE    ADHESIOLYSIS     APPENDECTOMY     CESAREAN SECTION     MEDIASTINOSCOPY N/A 04/18/2019   Procedure: MEDIASTINOSCOPY;  Surgeon: Delight Ovens, MD;  [facility] OR;  Service: Thoracic;  Laterality: N/A;   VIDEO BRONCHOSCOPY WITH ENDOBRONCHIAL ULTRASOUND N/A 04/18/2019   Procedure: VIDEO BRONCHOSCOPY WITH ENDOBRONCHIAL ULTRASOUND;  Surgeon: Delight Ovens, MD;  Location: [facility] OR;  Service: Thoracic;  Laterality: N/A;    Family History  Problem Relation Age of Onset   Breast cancer Mother    Anuerysm Mother        BRAIN   Cancer Mother        BREAST   COPD Father    Hyperlipidemia Father    Congestive Heart Failure Father    Bipolar disorder Sister    Diabetes Sister    Fibromyalgia Sister    Cancer Paternal Uncle        LUNG   Breast cancer Maternal Grandmother    Cancer Maternal Grandmother 20       BREAST   Cancer Paternal Grandfather        LUNG   Social History   Socioeconomic History   Marital status: Divorced    Spouse name: Not on file   Number of children: Not on file   Years of education: Not on file   Highest education level: Not on file  Occupational History   Not on file  Tobacco Use   Smoking status: Never   Smokeless tobacco: Never  Vaping Use   Vaping status: Never Used  Substance and Sexual Activity   Alcohol use: Yes    Comment: VERY RARE OCCASION   Drug use: Never   Sexual activity: Not Currently  Other Topics Concern   Not on file  Social History Narrative   Not on file   Social Determinants of Health   Financial Resource Strain: Low Risk  (12/08/2022)   Overall Financial Resource Strain (CARDIA)    Difficulty of Paying Living Expenses:  Not hard at all  Food Insecurity: Low Risk  (09/17/2023)   Received from [facility]   Hunger Vital Sign    Worried About Running Out of Food in the Last Year: Never true    Ran Out of Food in the Last Year: Never true  Transportation Needs: No Transportation Needs (09/17/2023)   Received from  [employer]    In the past 12 months, has lack of reliable transportation kept you from medical appointments, meetings, work or from getting things needed for daily living? : No  Physical Activity: Inactive (12/08/2022)   Exercise Vital Sign    Days of Exercise per Week: 0 days    Minutes of Exercise per Session: 0 min  Stress: No Stress Concern Present (12/08/2022)   [employer] of Occupational Health - Occupational Stress Questionnaire    Feeling of Stress : Not at all  Social Connections: Moderately Isolated (12/08/2022)   Social Connection and Isolation Panel [NHANES]    Frequency of Communication with Friends and Family: More than three times a week    Frequency of Social Gatherings with Friends and Family: Once a week    Attends Religious Services: More than 4 times per year    Active Member of [employer] or Organizations: No    Attends Engineer, structural: Never    Marital Status: Divorced    Objective:  BP 126/70   Pulse 85   Temp (!) 96.8 F (36 C)   Resp 16   Ht 5' 10.5" (1.791 m)   Wt 293 lb (132.9 kg)   SpO2 96%   BMI 41.45 kg/m      09/24/2023   10:26 AM 09/12/2023    4:31 PM 08/10/2023   10:14 AM  BP/Weight  Systolic BP 126 120 108  Diastolic BP 70 70 70  Wt. (Lbs) 293 292.2 295  BMI 41.45 kg/m2 41.33 kg/m2 41.73 kg/m2    Physical Exam Vitals reviewed.  Constitutional:      Appearance: Normal appearance.  Cardiovascular:     Rate and Rhythm: Normal rate and regular rhythm.     Heart sounds: Normal heart sounds.  Pulmonary:     Effort: Pulmonary effort is normal.     Breath sounds: Normal breath sounds.  Abdominal:     General: Bowel sounds are normal.     Palpations: Abdomen is soft.     Tenderness: There is no abdominal tenderness.  Musculoskeletal:     Right foot: Normal capillary refill. Swelling and tenderness present. No crepitus. Normal pulse.     Left foot: Normal.  Neurological:     Mental Status: She is  alert and oriented to person, place, and time.  Psychiatric:        Mood and Affect: Mood normal.        Behavior: Behavior normal.     Diabetic Foot Exam - Simple   No data filed      Lab Results  Component Value Date   WBC 9.2 07/31/2023   HGB 15.2 07/31/2023   HCT 45.6 07/31/2023   PLT 266 07/31/2023   GLUCOSE 132 (H) 07/31/2023   CHOL 233 (H) 07/31/2023   TRIG 250 (H) 07/31/2023   HDL 44 07/31/2023   LDLCALC 144 (H) 07/31/2023   ALT 38 (H) 07/31/2023   AST 32 07/31/2023   NA 140 07/31/2023   K 4.5 07/31/2023   CL 100 07/31/2023   CREATININE 0.67 07/31/2023   BUN 11 07/31/2023  CO2 23 07/31/2023   TSH 1.360 07/31/2023   INR 1.0 04/16/2019   HGBA1C 10.0 (H) 07/31/2023   MICROALBUR 80 08/15/2020      Assessment & Plan:    Hospital discharge follow-up Assessment & Plan: -Schedule follow-up appointment for foot around 10/10/2023-10/12/2023. -Regular follow-up in January 2025.    Moderate recurrent major depression (HCC) Assessment & Plan: Elevated PHQ-9 score. Patient expresses desire for therapy. -Encourage patient to seek therapy. -Consider referral to psychiatry if patient is unable to find a therapist. -Continue current medications (Buspar and Wellbutrin).   Type 2 diabetes mellitus with hyperglycemia, without long-term current use of insulin (HCC) Assessment & Plan: Recent improvement in A1c to 7.4 on Ozempic 2.5mg . -Continue Ozempic 2.5mg . -Consider ibuprofen for foot inflammation.   Cellulitis of foot, right Assessment & Plan: Recent hospitalization for foot infection secondary to foreign body (tack). Currently on antibiotics with noted improvement in redness and swelling. Possible old Lisfranc fracture noted on imaging. -Continue current antibiotics for the full course. -Monitor for worsening redness, swelling, or pain. -Consider follow-up MRI if redness does not completely resolve after antibiotics. -Limit walking to less than 2 hours a day  until 10/05/2023 or if symptoms resolve. -Consider compression socks and ibuprofen for swelling.   Mixed hyperlipidemia Assessment & Plan: Patient has lost weight and is interested in further improvement. -Encourage continued weight loss and cholesterol management.      No orders of the defined types were placed in this encounter.   No orders of the defined types were placed in this encounter.   Assessment and Plan         Follow-up: Return in about 2 weeks (around 10/08/2023), or if symptoms worsen or fail to improve.   I,Marla I Leal-Borjas,acting as a scribe for [employer], PA.,have documented all relevant documentation on the behalf of Langley Gauss, PA,as directed by  Langley Gauss, PA while in the presence of Langley Gauss, [place].   An After Visit Summary was printed and given to the patient.  Langley Gauss, [place] [facility] [phone]

## 2023-09-24 NOTE — Assessment & Plan Note (Signed)
Recent improvement in A1c to 7.4 on Ozempic 2.5mg . -Continue Ozempic 2.5mg . -Consider ibuprofen for foot inflammation.

## 2023-09-24 NOTE — Assessment & Plan Note (Signed)
-  Schedule follow-up appointment for foot around 10/10/2023-10/12/2023. -Regular follow-up in January 2025.

## 2023-09-24 NOTE — Assessment & Plan Note (Signed)
Patient has lost weight and is interested in further improvement. -Encourage continued weight loss and cholesterol management.

## 2023-09-24 NOTE — Assessment & Plan Note (Signed)
Recent hospitalization for foot infection secondary to foreign body (tack). Currently on antibiotics with noted improvement in redness and swelling. Possible old Lisfranc fracture noted on imaging. -Continue current antibiotics for the full course. -Monitor for worsening redness, swelling, or pain. -Consider follow-up MRI if redness does not completely resolve after antibiotics. -Limit walking to less than 2 hours a day until 10/05/2023 or if symptoms resolve. -Consider compression socks and ibuprofen for swelling.

## 2023-09-24 NOTE — Assessment & Plan Note (Signed)
Elevated PHQ-9 score. Patient expresses desire for therapy. -Encourage patient to seek therapy. -Consider referral to psychiatry if patient is unable to find a therapist. -Continue current medications (Buspar and Wellbutrin).

## 2023-09-28 NOTE — Telephone Encounter (Signed)
Patient made aware, appointment made. 

## 2023-09-29 ENCOUNTER — Encounter: Payer: Self-pay | Admitting: Physician Assistant

## 2023-09-29 DIAGNOSIS — E1165 Type 2 diabetes mellitus with hyperglycemia: Secondary | ICD-10-CM

## 2023-10-01 ENCOUNTER — Ambulatory Visit: Payer: BC Managed Care – PPO

## 2023-10-01 ENCOUNTER — Telehealth: Payer: Self-pay

## 2023-10-01 NOTE — Telephone Encounter (Signed)
A MyChart message was sent to the patient.   Copied from CRM [id number]. Topic: Appointments - Appointment Cancel/Reschedule >> Oct 01, 2023  1:10 PM Holly [name] wrote: Reason for CRM: PT would like confirmation VIA MyChart that she will not be charged for cancelling her app today 12/9

## 2023-10-02 ENCOUNTER — Other Ambulatory Visit: Payer: Self-pay

## 2023-10-02 ENCOUNTER — Encounter: Payer: Self-pay | Admitting: Physician Assistant

## 2023-10-02 MED ORDER — TIRZEPATIDE 5 MG/0.5ML ~~LOC~~ SOAJ
5.0000 mg | SUBCUTANEOUS | 1 refills | Status: AC
Start: 2023-10-02 — End: 2023-12-01

## 2023-10-02 MED ORDER — FLUCONAZOLE 150 MG PO TABS: 150.0000 mg | ORAL_TABLET | Freq: Every day | ORAL | 0 refills | Status: DC

## 2023-10-04 ENCOUNTER — Ambulatory Visit: Payer: BC Managed Care – PPO | Admitting: Physician Assistant

## 2023-10-04 ENCOUNTER — Encounter: Payer: Self-pay | Admitting: Physician Assistant

## 2023-10-04 VITALS — BP 118/72 | HR 78 | Temp 97.0°F | Ht 70.5 in | Wt 289.0 lb

## 2023-10-04 DIAGNOSIS — J40 Bronchitis, not specified as acute or chronic: Secondary | ICD-10-CM

## 2023-10-04 MED ORDER — AMOXICILLIN-POT CLAVULANATE 875-125 MG PO TABS: 1.0000 | ORAL_TABLET | Freq: Two times a day (BID) | ORAL | 0 refills | Status: DC

## 2023-10-04 MED ORDER — PREDNISONE 20 MG PO TABS: ORAL_TABLET | ORAL | 0 refills | Status: DC

## 2023-10-04 NOTE — Progress Notes (Signed)
Acute Office Visit  Subjective:    Patient ID: Holly [name], female    DOB: [birth year], 57 y.o.   MRN: [id number]  Chief Complaint  Patient presents with   Sinusitis    HPI: Patient is in today for complaints of sinus congestion, sore throat, cough and mild wheezing for the past 1-2 weeks.  She states she is not using her inhalers as directed.  She is using otc decongestants which is helping somewhat Denies fever but has had some malaise Complains of facial pressure and sore throat also Pt is diabetic - states fasting glucose ranging around 100   Current Outpatient Medications:    albuterol (VENTOLIN HFA) 108 (90 Base) MCG/ACT inhaler, Inhale 2 puffs into the lungs every 6 (six) hours as needed for wheezing or shortness of breath., Disp: 18 g, Rfl: 2   amoxicillin-clavulanate (AUGMENTIN) 875-125 MG tablet, Take 1 tablet by mouth 2 (two) times daily., Disp: 20 tablet, Rfl: 0   budesonide-formoterol (SYMBICORT) 160-4.5 MCG/ACT inhaler, Inhale 2 puffs into the lungs 2 (two) times daily., Disp: 1 each, Rfl: 3   buPROPion (WELLBUTRIN XL) 150 MG 24 hr tablet, TAKE 1 TABLET BY MOUTH EVERY DAY, Disp: 90 tablet, Rfl: 0   busPIRone (BUSPAR) 7.5 MG tablet, TAKE 1 TABLET BY MOUTH TWICE A DAY, Disp: 180 tablet, Rfl: 1   clonazePAM (KLONOPIN) 0.5 MG tablet, Take 1 tablet (0.5 mg total) by mouth 2 (two) times daily as needed for anxiety., Disp: 10 tablet, Rfl: 1   cyclobenzaprine (FLEXERIL) 5 MG tablet, Take 1 tablet (5 mg total) by mouth 3 (three) times daily as needed for muscle spasms., Disp: 60 tablet, Rfl: 1   famotidine (PEPCID) 40 MG tablet, TAKE 1 TABLET BY MOUTH TWICE A DAY, Disp: 180 tablet, Rfl: 1   FARXIGA 5 MG TABS tablet, TAKE 1 TABLET BY MOUTH EVERY DAY BEFORE BREAKFAST, Disp: 90 tablet, Rfl: 1   fluconazole (DIFLUCAN) 150 MG tablet, Take 1 tablet (150 mg total) by mouth daily., Disp: 2 tablet, Rfl: 0   fluticasone (FLONASE) 50 MCG/ACT nasal spray, Place 2 sprays into both nostrils  daily., Disp: 16 g, Rfl: 6   gabapentin (NEURONTIN) 300 MG capsule, TAKE 2 CAPSULES BY MOUTH 2 TIMES DAILY., Disp: 360 capsule, Rfl: 0   irbesartan (AVAPRO) 75 MG tablet, TAKE 1 TABLET BY MOUTH EVERY DAY, Disp: 90 tablet, Rfl: 0   levothyroxine (SYNTHROID) 25 MCG tablet, TAKE 1 TABLET BY MOUTH EVERY DAY BEFORE BREAKFAST, Disp: 90 tablet, Rfl: 0   metFORMIN (GLUCOPHAGE) 1000 MG tablet, Take 1 tablet (1,000 mg total) by mouth 2 (two) times daily with a meal., Disp: 180 tablet, Rfl: 2   mometasone (NASONEX) 50 MCG/ACT nasal spray, Place 2 sprays into the nose daily., Disp: 17 g, Rfl: 12   Multiple Vitamin (MULTI-VITAMIN) tablet, Take 1 tablet by mouth daily., Disp: , Rfl:    omeprazole (PRILOSEC) 40 MG capsule, 1 (ONE) CAPSULE EVERY MORNING 30 MINUTES BEFORE BREAKFAST, Disp: 90 capsule, Rfl: 1   predniSONE (DELTASONE) 20 MG tablet, 1 po tid for 3 days then 1 po bid for 3 days then 1 po qd for 3 days, Disp: 18 tablet, Rfl: 0   rosuvastatin (CRESTOR) 20 MG tablet, Take 1 tablet (20 mg total) by mouth daily., Disp: 90 tablet, Rfl: 1   SODIUM FLUORIDE 5000 PPM 1.1 % PSTE, See admin instructions., Disp: , Rfl:    tirzepatide (MOUNJARO) 5 MG/0.5ML Pen, Inject 5 mg into the skin once a week., Disp:  6 mL, Rfl: 1   traZODone (DESYREL) 150 MG tablet, TAKE 1 TABLET BY MOUTH EVERYDAY AT BEDTIME, Disp: 90 tablet, Rfl: 0   venlafaxine XR (EFFEXOR-XR) 75 MG 24 hr capsule, TAKE 3 CAPSULES BY MOUTH DAILY IN THE MORNING, Disp: 270 capsule, Rfl: 1   Vitamin D, Ergocalciferol, (DRISDOL) 1.25 MG (50000 UNIT) CAPS capsule, Take 1 capsule (50,000 Units total) by mouth every 7 (seven) days., Disp: 5 capsule, Rfl: 2  Allergies  Allergen Reactions   Moxifloxacin Anaphylaxis, Other (See Comments) and Hives   Avelox [Moxifloxacin Hcl In Nacl]    Cymbalta [Duloxetine Hcl]    Rybrevant [Amivantamab-Vmjw]    Duloxetine Hives   Oxycodone-Acetaminophen Hives    ROS CONSTITUTIONAL: see HPI E/N/T: see HPI CARDIOVASCULAR:  Negative for chest pain, dizziness, palpitations and pedal edema.  RESPIRATORY: see HPI GASTROINTESTINAL: Negative for abdominal pain, acid reflux symptoms, constipation, diarrhea, nausea and vomiting.  MSK: Negative for arthralgias and myalgias.      Objective:    PHYSICAL EXAM:   BP 118/72 (BP Location: Left Arm, Patient Position: Sitting)   Pulse 78   Temp (!) 97 F (36.1 C) (Temporal)   Ht 5' 10.5" (1.791 m)   Wt 289 lb (131.1 kg)   SpO2 98%   BMI 40.88 kg/m    GEN: Well nourished, well developed, in no acute distress  HEENT: normal external ears and nose - normal external auditory canals and TMS - - Lips, Teeth and Gums - normal  Oropharynx - erythema noted  Cardiac: RRR; no murmurs Respiratory:  normal respiratory rate and pattern with no distress - normal breath sounds with no rales, rhonchi, wheezes or rubs      Assessment & Plan:    Bronchitis -     Amoxicillin-Pot Clavulanate; Take 1 tablet by mouth 2 (two) times daily.  Dispense: 20 tablet; Refill: 0 -     predniSONE; 1 po tid for 3 days then 1 po bid for 3 days then 1 po qd for 3 days  Dispense: 18 tablet; Refill: 0 Use inhalers as directed    Follow-up: Return if symptoms worsen or fail to improve.  An After Visit Summary was printed and given to the patient.  Jettie Pagan [facility] [phone]

## 2023-10-08 ENCOUNTER — Other Ambulatory Visit: Payer: Self-pay | Admitting: Family Medicine

## 2023-10-08 NOTE — Telephone Encounter (Signed)
Copied from CRM [id number]. Topic: Clinical - Medication Refill >> Oct 08, 2023  3:34 PM Desma Mcgregor wrote: Most Recent Primary Care Visit:  Provider: Marianne Sofia  Department: [facility]  Visit Type: ACUTE  Date: 10/04/2023  Medication: ***  Has the patient contacted their pharmacy?  (Agent: If no, request that the patient contact the pharmacy for the refill. If patient does not wish to contact the pharmacy document the reason why and proceed with request.) (Agent: If yes, when and what did the pharmacy advise?)  Is this the correct pharmacy for this prescription?  If no, delete pharmacy and type the correct one.  This is the patient's preferred pharmacy:  [id number] - [place], [place] - [address] AT [facility] [address] [place] [address] Phone: [phone] Fax: [phone]  [id number] - [place], [place] - [address] AT [facility] [address] [place] [address] Phone: [phone] Fax: [phone]  [id number] - [place],  - [address] [place] [address] Phone: [phone] Fax: [phone]   Has the prescription been filled recently?   Is the patient out of the medication?   Has the patient been seen for an appointment in the last year OR does the patient have an upcoming appointment?   Can we respond through MyChart?   Agent: Please be advised that Rx refills may take up to 3 business days. We ask that you follow-up with your pharmacy.

## 2023-10-13 ENCOUNTER — Encounter: Payer: Self-pay | Admitting: Physician Assistant

## 2023-10-15 ENCOUNTER — Ambulatory Visit: Payer: Self-pay | Admitting: Physician Assistant

## 2023-10-15 ENCOUNTER — Encounter: Payer: Self-pay | Admitting: Physician Assistant

## 2023-10-15 ENCOUNTER — Other Ambulatory Visit: Payer: Self-pay | Admitting: Physician Assistant

## 2023-10-15 DIAGNOSIS — J0100 Acute maxillary sinusitis, unspecified: Secondary | ICD-10-CM

## 2023-10-15 MED ORDER — CEFDINIR 300 MG PO CAPS: 300.0000 mg | ORAL_CAPSULE | Freq: Two times a day (BID) | ORAL | 0 refills | Status: AC

## 2023-10-15 NOTE — Telephone Encounter (Signed)
Copied from CRM [id number]. Topic: Clinical - Red Word Triage >> Oct 15, 2023  1:28 PM Holly [name] wrote: Red Word that prompted transfer to Nurse Triage: Patient called and stated the medications that Dr. Earlene Plater on 10/04/2023 and she has completed the medications but the bronchitis is still active and sinus infection is even worse. Left ear is very painful and she has very weak immune system due to her health condition.   Chief Complaint: Sinus infection still present after antibiotic treatment  Symptoms: Sinus congestion and pain, chest pain (not new, present during initial evaluation), left ear pain Frequency: Constant  Pertinent Negatives: Patient denies fever Disposition: [] ED /[x] Urgent Care (no appt availability in office) / [] Appointment(In office/virtual)/ []  [facility]/ [] Home Care/ [] Refused Recommended Disposition /[] [facility]/ []  Follow-up with PCP Additional Notes: Patient reports he was seen on 10/04/23 and was diagnosed with bronchitis and a sinus infection. Patient reports he finished taking Augmentin yesterday but his symptoms are still present. He states that his symptoms began to improve on the antibiotic but now feels like they are worsening again. Patient advised there are no appointments available in office and to follow up with Urgent Care for more immediate treatment. Patient advised to call back for any new or worsening symptoms. He understood and is agreeable with this plan.     Reason for Disposition  [1] Taking antibiotic > 7 days AND [2] nasal discharge not improved  Answer Assessment - Initial Assessment Questions 1. ANTIBIOTIC: "What antibiotic are you taking?" "How many times a day?"     Augmentin, finished yesterday 2. ONSET: "When was the antibiotic started?"     10/04/23 3. PAIN: "How bad is the sinus pain?"   (Scale 1-10; mild, moderate or severe)   - MILD (1-3): doesn't interfere with normal activities    - MODERATE (4-7): interferes  with normal activities (e.g., work or school) or awakens from sleep   - SEVERE (8-10): excruciating pain and patient unable to do any normal activities        4/10 4. FEVER: "Do you have a fever?" If Yes, ask: "What is it, how was it measured, and when did it start?"      No 5. SYMPTOMS: "Are there any other symptoms you're concerned about?" If Yes, ask: "When did it start?"     Left ear pain, chest pain that was present on 10/04/23 when seen by provider  Protocols used: Sinus Infection on Antibiotic Follow-up Call-[facility]

## 2023-10-18 ENCOUNTER — Other Ambulatory Visit: Payer: Self-pay | Admitting: Family Medicine

## 2023-10-18 MED ORDER — FLUCONAZOLE 150 MG PO TABS: 150.0000 mg | ORAL_TABLET | Freq: Every day | ORAL | 0 refills | Status: DC

## 2023-10-19 ENCOUNTER — Other Ambulatory Visit: Payer: Self-pay | Admitting: Family Medicine

## 2023-10-19 DIAGNOSIS — E1142 Type 2 diabetes mellitus with diabetic polyneuropathy: Secondary | ICD-10-CM

## 2023-10-19 DIAGNOSIS — J208 Acute bronchitis due to other specified organisms: Secondary | ICD-10-CM

## 2023-10-19 DIAGNOSIS — R42 Dizziness and giddiness: Secondary | ICD-10-CM

## 2023-10-23 ENCOUNTER — Encounter: Payer: Self-pay | Admitting: Family Medicine

## 2023-10-26 ENCOUNTER — Telehealth: Payer: Self-pay

## 2023-10-26 ENCOUNTER — Encounter: Payer: Self-pay | Admitting: Physician Assistant

## 2023-10-26 NOTE — Telephone Encounter (Signed)
 Copied from CRM [id number]. Topic: Clinical - Medication Question >> Oct 25, 2023  3:53 PM Carlatta H wrote: Reason for CRM: Patient called and stated that all antibiotics have been taking and she's still not feeling well//She would like to know if she could have some breathing treatments prescribed//Please call

## 2023-10-29 ENCOUNTER — Telehealth: Payer: Self-pay

## 2023-10-29 ENCOUNTER — Other Ambulatory Visit: Payer: Self-pay | Admitting: Physician Assistant

## 2023-10-29 DIAGNOSIS — R053 Chronic cough: Secondary | ICD-10-CM

## 2023-10-29 NOTE — Telephone Encounter (Signed)
 Copied from CRM [id number]. Topic: Clinical - Medical Advice >> Oct 23, 2023 12:18 PM Nestora J wrote: Reason for CRM: Pt states she is not feeling better following her appointment and wants to know if her PCP can prescribe her more medication or should she come back into to the office to be seen

## 2023-11-06 ENCOUNTER — Ambulatory Visit: Payer: Self-pay | Admitting: Physician Assistant

## 2023-11-06 ENCOUNTER — Encounter: Payer: Self-pay | Admitting: Physician Assistant

## 2023-11-06 VITALS — BP 130/80 | HR 92 | Temp 97.3°F | Ht 70.5 in | Wt 297.0 lb

## 2023-11-06 DIAGNOSIS — R0789 Other chest pain: Secondary | ICD-10-CM

## 2023-11-06 DIAGNOSIS — R053 Chronic cough: Secondary | ICD-10-CM | POA: Diagnosis not present

## 2023-11-06 DIAGNOSIS — R5383 Other fatigue: Secondary | ICD-10-CM | POA: Diagnosis not present

## 2023-11-06 DIAGNOSIS — R002 Palpitations: Secondary | ICD-10-CM

## 2023-11-06 DIAGNOSIS — J060 Acute laryngopharyngitis: Secondary | ICD-10-CM | POA: Diagnosis not present

## 2023-11-06 MED ORDER — AZITHROMYCIN 250 MG PO TABS: ORAL_TABLET | ORAL | 0 refills | Status: AC

## 2023-11-06 MED ORDER — FLUCONAZOLE 150 MG PO TABS: 150.0000 mg | ORAL_TABLET | Freq: Every day | ORAL | 1 refills | Status: DC

## 2023-11-06 MED ORDER — PREDNISONE 20 MG PO TABS: ORAL_TABLET | ORAL | 0 refills | Status: DC

## 2023-11-06 NOTE — Progress Notes (Signed)
 Acute Office Visit  Subjective:    Patient ID: Holly [name], female    DOB: [birth year], 58 y.o.   MRN: [id number]  Chief Complaint  Patient presents with   Cough   Nasal Congestion   Chills   Headache   Fatigue    HPI: Patient is in today for multiple complaints Pt states that she has had continued cough, congestion and sinus pressure - states that this has been going on for several weeks - she has been treated with augmentin  and omnicef  which helped symptoms some but not cleared.  Still having a productive cough  Pt states that she has been severely fatigued as well for several weeks - more than usual.  Does not have a lot of energy to do her normal activities She is diabetic and states that her glucose is doing much better since adding in medication with fasting glucose in 90s and after meals 120s  Pt states that she has had a constant chest pressure in the past several weeks - says it has not let up at all.  Describes in left upper chest.  At times feels more of a tightness.  At times it can be worse with movement She denies shortness of breath - she says at times she feels as though she is 'breathing through mud' and that has been constant for over a month since her uri symptoms started She is using her albuterol  as directed Pt admits to having palpitations with last episode a few months ago Has not had cardiology evaluation recently   Current Outpatient Medications:    albuterol  (VENTOLIN  HFA) 108 (90 Base) MCG/ACT inhaler, TAKE 2 PUFFS BY MOUTH EVERY 6 HOURS AS NEEDED FOR WHEEZE OR SHORTNESS OF BREATH, Disp: 18 each, Rfl: 2   azithromycin  (ZITHROMAX ) 250 MG tablet, Take 2 tablets on day 1, then 1 tablet daily on days 2 through 5, Disp: 6 tablet, Rfl: 0   budesonide -formoterol  (SYMBICORT ) 160-4.5 MCG/ACT inhaler, Inhale 2 puffs into the lungs 2 (two) times daily., Disp: 1 each, Rfl: 3   buPROPion  (WELLBUTRIN  XL) 150 MG 24 hr tablet, TAKE 1 TABLET BY MOUTH EVERY DAY, Disp: 90  tablet, Rfl: 0   busPIRone  (BUSPAR ) 7.5 MG tablet, TAKE 1 TABLET BY MOUTH TWICE A DAY, Disp: 180 tablet, Rfl: 1   clonazePAM  (KLONOPIN ) 0.5 MG tablet, Take 1 tablet (0.5 mg total) by mouth 2 (two) times daily as needed for anxiety., Disp: 10 tablet, Rfl: 1   cyclobenzaprine  (FLEXERIL ) 5 MG tablet, Take 1 tablet (5 mg total) by mouth 3 (three) times daily as needed for muscle spasms., Disp: 60 tablet, Rfl: 1   famotidine  (PEPCID ) 40 MG tablet, TAKE 1 TABLET BY MOUTH TWICE A DAY, Disp: 180 tablet, Rfl: 1   FARXIGA  5 MG TABS tablet, TAKE 1 TABLET BY MOUTH EVERY DAY BEFORE BREAKFAST, Disp: 90 tablet, Rfl: 1   fluconazole  (DIFLUCAN ) 150 MG tablet, Take 1 tablet (150 mg total) by mouth daily., Disp: 1 tablet, Rfl: 1   fluticasone  (FLONASE ) 50 MCG/ACT nasal spray, Place 2 sprays into both nostrils daily., Disp: 16 g, Rfl: 6   gabapentin  (NEURONTIN ) 300 MG capsule, TAKE 2 CAPSULES BY MOUTH 2 TIMES DAILY., Disp: 360 capsule, Rfl: 0   irbesartan  (AVAPRO ) 75 MG tablet, TAKE 1 TABLET BY MOUTH EVERY DAY, Disp: 90 tablet, Rfl: 0   levothyroxine  (SYNTHROID ) 25 MCG tablet, TAKE 1 TABLET BY MOUTH EVERY DAY BEFORE BREAKFAST, Disp: 90 tablet, Rfl: 0   metFORMIN  (GLUCOPHAGE ) 1000  MG tablet, Take 1 tablet (1,000 mg total) by mouth 2 (two) times daily with a meal., Disp: 180 tablet, Rfl: 2   mometasone  (NASONEX ) 50 MCG/ACT nasal spray, Place 2 sprays into the nose daily., Disp: 17 g, Rfl: 12   Multiple Vitamin (MULTI-VITAMIN) tablet, Take 1 tablet by mouth daily., Disp: , Rfl:    omeprazole (PRILOSEC) 40 MG capsule, 1 (ONE) CAPSULE EVERY MORNING 30 MINUTES BEFORE BREAKFAST, Disp: 90 capsule, Rfl: 1   predniSONE  (DELTASONE ) 20 MG tablet, 1 po tid for 3 days then 1 po bid for 3 days then 1 po qd for 3 days, Disp: 18 tablet, Rfl: 0   SODIUM FLUORIDE 5000 PPM 1.1 % PSTE, See admin instructions., Disp: , Rfl:    tirzepatide  (MOUNJARO ) 5 MG/0.5ML Pen, Inject 5 mg into the skin once a week., Disp: 6 mL, Rfl: 1   traZODone   (DESYREL ) 150 MG tablet, TAKE 1 TABLET BY MOUTH EVERYDAY AT BEDTIME, Disp: 90 tablet, Rfl: 0   venlafaxine XR (EFFEXOR-XR) 75 MG 24 hr capsule, TAKE 3 CAPSULES BY MOUTH DAILY IN THE MORNING, Disp: 270 capsule, Rfl: 1   Vitamin D , Ergocalciferol , (DRISDOL ) 1.25 MG (50000 UNIT) CAPS capsule, Take 1 capsule (50,000 Units total) by mouth every 7 (seven) days., Disp: 5 capsule, Rfl: 2  Allergies  Allergen Reactions   Moxifloxacin Anaphylaxis, Other (See Comments) and Hives   Avelox [Moxifloxacin Hcl In Nacl]    Cymbalta [Duloxetine Hcl]    Rybrevant [Amivantamab-Vmjw]    Duloxetine Hives   Oxycodone-Acetaminophen Hives    ROS CONSTITUTIONAL: see HPI E/N/T: see HPI CARDIOVASCULAR: Negative for chest pain, dizziness, palpitations and pedal edema.  RESPIRATORY: see HPI GASTROINTESTINAL: Negative for abdominal pain, acid reflux symptoms, constipation, diarrhea, nausea and vomiting.  MSK: Negative for arthralgias and myalgias.  INTEGUMENTARY: Negative for rash.  NEUROLOGICAL: Negative for dizziness and headaches.  PSYCHIATRIC: Negative for sleep disturbance and to question depression screen.  Negative for depression, negative for anhedonia.      Objective:    PHYSICAL EXAM:   BP 130/80 (BP Location: Left Arm, Patient Position: Sitting, Cuff Size: Large)   Pulse 92   Temp (!) 97.3 F (36.3 C) (Temporal)   Ht 5' 10.5 (1.791 m)   Wt 297 lb (134.7 kg)   SpO2 95%   BMI 42.01 kg/m    GEN: Well nourished, well developed, in no acute distress  HEENT: normal external ears and nose - normal external auditory canals and TMS - hearing grossly normal - - Lips, Teeth and Gums - normal  Oropharynx - erythema noted Neck: no JVD or masses - no thyromegaly Cardiac: RRR; no murmurs, rubs, or gallops,no edema - no significant varicosities Respiratory:  normal respiratory rate and pattern with no distress - normal breath sounds with no rales, rhonchi, wheezes or rubs GI: normal bowel sounds, no  masses or tendernessy  Skin: warm and dry, no rash  Neuro:  Alert and Oriented x 3,  CN II-Xii grossly intact Psych: euthymic mood, appropriate affect and demeanor    EKG - Twave inversion in III otherwise no acute changes Assessment & Plan:    Other chest pain -     EKG 12-Lead -     Ambulatory referral to Cardiology  Other fatigue -     CBC with Differential/Platelet -     Comprehensive metabolic panel -     TSH -     Ambulatory referral to Cardiology  Chronic cough -     DG Chest  2 View; Future  Acute laryngopharyngitis -     Azithromycin ; Take 2 tablets on day 1, then 1 tablet daily on days 2 through 5  Dispense: 6 tablet; Refill: 0 -     predniSONE ; 1 po tid for 3 days then 1 po bid for 3 days then 1 po qd for 3 days  Dispense: 18 tablet; Refill: 0  Other orders -     Fluconazole ; Take 1 tablet (150 mg total) by mouth daily.  Dispense: 1 tablet; Refill: 1   Palpitations Labwork pending Cardiology referral done  Follow-up: Return in about 4 weeks (around 12/04/2023) for follow-up with Nola.  An After Visit Summary was printed and given to the patient.  CAMIE JONELLE NICHOLAUS DEVONNA [facility] [phone]

## 2023-11-07 ENCOUNTER — Encounter: Payer: Self-pay | Admitting: Physician Assistant

## 2023-11-07 LAB — CBC WITH DIFFERENTIAL/PLATELET
Basophils Absolute: 0 10*3/uL (ref 0.0–0.2)
Basos: 0 %
EOS (ABSOLUTE): 0.1 10*3/uL (ref 0.0–0.4)
Eos: 1 %
Hematocrit: 45.6 % (ref 34.0–46.6)
Hemoglobin: 15.2 g/dL (ref 11.1–15.9)
Immature Grans (Abs): 0.1 10*3/uL (ref 0.0–0.1)
Immature Granulocytes: 1 %
Lymphocytes Absolute: 2.7 10*3/uL (ref 0.7–3.1)
Lymphs: 23 %
MCH: 30.5 pg (ref 26.6–33.0)
MCHC: 33.3 g/dL (ref 31.5–35.7)
MCV: 91 fL (ref 79–97)
Monocytes Absolute: 0.6 10*3/uL (ref 0.1–0.9)
Monocytes: 5 %
Neutrophils Absolute: 8.1 10*3/uL — ABNORMAL HIGH (ref 1.4–7.0)
Neutrophils: 70 %
Platelets: 304 10*3/uL (ref 150–450)
RBC: 4.99 x10E6/uL (ref 3.77–5.28)
RDW: 12.5 % (ref 11.7–15.4)
WBC: 11.6 10*3/uL — ABNORMAL HIGH (ref 3.4–10.8)

## 2023-11-07 LAB — COMPREHENSIVE METABOLIC PANEL
ALT: 28 [IU]/L (ref 0–32)
AST: 24 [IU]/L (ref 0–40)
Albumin: 4.3 g/dL (ref 3.8–4.9)
Alkaline Phosphatase: 111 [IU]/L (ref 44–121)
BUN/Creatinine Ratio: 15 (ref 9–23)
BUN: 12 mg/dL (ref 6–24)
Bilirubin Total: 0.3 mg/dL (ref 0.0–1.2)
CO2: 25 mmol/L (ref 20–29)
Calcium: 9.6 mg/dL (ref 8.7–10.2)
Chloride: 96 mmol/L (ref 96–106)
Creatinine, Ser: 0.8 mg/dL (ref 0.57–1.00)
Globulin, Total: 2.6 g/dL (ref 1.5–4.5)
Glucose: 114 mg/dL — ABNORMAL HIGH (ref 70–99)
Potassium: 4.6 mmol/L (ref 3.5–5.2)
Sodium: 139 mmol/L (ref 134–144)
Total Protein: 6.9 g/dL (ref 6.0–8.5)
eGFR: 86 mL/min/{1.73_m2} (ref >59)

## 2023-11-07 LAB — TSH: TSH: 1.64 u[IU]/mL (ref 0.450–4.500)

## 2023-11-08 ENCOUNTER — Encounter: Payer: Self-pay | Admitting: Physician Assistant

## 2023-11-13 ENCOUNTER — Encounter: Payer: Self-pay | Admitting: Cardiology

## 2023-11-13 DIAGNOSIS — K760 Fatty (change of) liver, not elsewhere classified: Secondary | ICD-10-CM | POA: Insufficient documentation

## 2023-11-13 DIAGNOSIS — E049 Nontoxic goiter, unspecified: Secondary | ICD-10-CM | POA: Insufficient documentation

## 2023-11-13 DIAGNOSIS — F32A Depression, unspecified: Secondary | ICD-10-CM | POA: Insufficient documentation

## 2023-11-13 DIAGNOSIS — E119 Type 2 diabetes mellitus without complications: Secondary | ICD-10-CM | POA: Insufficient documentation

## 2023-11-13 DIAGNOSIS — Z87442 Personal history of urinary calculi: Secondary | ICD-10-CM | POA: Insufficient documentation

## 2023-11-13 NOTE — Progress Notes (Deleted)
Cardiology Office Note:    Date:  11/14/2023   ID:  Holly [name], DOB 1966-10-05, MRN [id number]  PCP:  Holly Gauss, PA  Cardiologist:  Norman Herrlich, MD   Referring MD: Holly [name], Holly [name]  ASSESSMENT:    1. Precordial pain   2. Primary hypertension   3. Mixed hyperlipidemia   4. Type 2 diabetes mellitus with hyperglycemia, without long-term current use of insulin (HCC)    PLAN:    In order of problems listed above:  ***  Next appointment   Medication Adjustments/Labs and Tests Ordered: Current medicines are reviewed at length with the patient today.  Concerns regarding medicines are outlined above.  No orders of the defined types were placed in this encounter.  No orders of the defined types were placed in this encounter.    No chief complaint on file. ***  History of Present Illness:    Holly [name] is a 58 y.o. female with a history of hypertension and type 2 diabetes who is being seen today for the evaluation of chest pain at the request of Holly [name], Holly [name].  She was seen with her PCP 11/06/2023 complaining of constant chest pain and fatigue for several weeks.  I independently reviewed the EKG 11/06/2023 showing sinus rhythm  normal EKG. She had a chest CT performed 02/21/2022 showing normal cardiovascular structures. Past Medical History:  Diagnosis Date   Acute pain of left shoulder 09/12/2023   Allergic rhinitis 05/26/2019   Anxiety    At risk for obstructive sleep apnea 09/28/2020   BMI 40.0-44.9, adult (HCC) 08/02/2020   Cellulitis of foot, right 09/24/2023   COVID-19 virus detected 12/30/2019   10/30/2019-SARS-CoV-2-positive Didn't require hospitalization     Depression    Diabetes mellitus without complication (HCC)    Diabetic glomerulopathy (HCC)    Diabetic polyneuropathy (HCC) 08/02/2020   Fatty liver    Fracture of intermediate cuneiform bone of right foot 09/16/2023   Generalized abdominal pain 01/09/2023   Goiter    Hashimoto's thyroiditis     Hilar adenopathy 03/20/19  04/11/19   CTA CHEST and PET per DR. Anne Ng [facility]   History of kidney stones    Hospital discharge follow-up 09/24/2023   Hypertension    Hypothyroidism (acquired) 08/02/2020   Insomnia 11/12/2022   Mediastinal adenopathy    PER CTA CHEST PET SCAN per DR. 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 Heath Gold [facility]     Severe recurrent major depression without psychotic features (HCC)    Strain of lumbar region 09/10/2022   Supraclavicular adenopathy 03/20/2019   PER CTA CHEST and PET per DR. Sudie Bailey [facility]   Type 2 diabetes mellitus with hyperglycemia, without long-term current use of insulin (HCC) 08/10/2023   Vaginal yeast infection 07/31/2023   Vitamin D deficiency 12/09/2022    Past Surgical History:  Procedure Laterality Date   ABDOMINAL HYSTERECTOMY     COMPLETE   ADHESIOLYSIS     APPENDECTOMY     CESAREAN SECTION     MEDIASTINOSCOPY N/A 04/18/2019   Procedure: MEDIASTINOSCOPY;  Surgeon: Delight Ovens, MD;  Location: [facility] OR;  Service: Thoracic;  Laterality: N/A;   VIDEO BRONCHOSCOPY WITH ENDOBRONCHIAL ULTRASOUND N/A 04/18/2019   Procedure: VIDEO BRONCHOSCOPY WITH ENDOBRONCHIAL ULTRASOUND;  Surgeon: Tyrone Sage,  Gwenith Daily, MD;  Location: [facility] OR;  Service: Thoracic;  Laterality: N/A;    Current Medications: No outpatient medications have been marked as taking for the 11/14/23 encounter (Appointment) with Baldo Daub, MD.     Allergies:   Moxifloxacin, Avelox [moxifloxacin hcl in nacl], Cymbalta [duloxetine hcl], Rybrevant [amivantamab-vmjw], Duloxetine, and Oxycodone-acetaminophen   Social History   Socioeconomic History   Marital status: Divorced    Spouse name: Not on file   Number of children: Not on  file   Years of education: Not on file   Highest education level: Master's degree (e.g., MA, MS, MEng, MEd, MSW, MBA)  Occupational History   Not on file  Tobacco Use   Smoking status: Never   Smokeless tobacco: Never  Vaping Use   Vaping status: Never Used  Substance and Sexual Activity   Alcohol use: Yes    Comment: VERY RARE OCCASION   Drug use: Never   Sexual activity: Not Currently  Other Topics Concern   Not on file  Social History Narrative   Not on file   Social Drivers of Health   Financial Resource Strain: Medium Risk (09/30/2023)   Overall Financial Resource Strain (CARDIA)    Difficulty of Paying Living Expenses: Somewhat hard  Food Insecurity: Food Insecurity Present (09/30/2023)   Hunger Vital Sign    Worried About Running Out of Food in the Last Year: Sometimes true    Ran Out of Food in the Last Year: Never true  Transportation Needs: No Transportation Needs (09/30/2023)   PRAPARE - Administrator, Civil Service (Medical): No    Lack of Transportation (Non-Medical): No  Physical Activity: Insufficiently Active (09/30/2023)   Exercise Vital Sign    Days of Exercise per Week: 2 days    Minutes of Exercise per Session: 20 min  Stress: Stress Concern Present (09/30/2023)   [employer] of Occupational Health - Occupational Stress Questionnaire    Feeling of Stress : To some extent  Social Connections: Moderately Integrated (09/30/2023)   Social Connection and Isolation Panel [NHANES]    Frequency of Communication with Friends and Family: More than three times a week    Frequency of Social Gatherings with Friends and Family: Once a week    Attends Religious Services: More than 4 times per year    Active Member of [employer] or Organizations: Yes    Attends Banker Meetings: 1 to 4 times per year    Marital Status: Divorced     Family History: The patient's ***family history includes Anuerysm in her mother; Bipolar disorder in her sister;  Breast cancer in her maternal grandmother and mother; COPD in her father; Cancer in her mother, paternal grandfather, and paternal uncle; Cancer (age of onset: 41) in her maternal grandmother; Congestive Heart Failure in her father; Diabetes in her sister; Fibromyalgia in her sister; Hyperlipidemia in her father.  ROS:   ROS Please see the history of present illness.    *** All other systems reviewed and are negative.  EKGs/Labs/Other Studies Reviewed:    The following studies were reviewed today: ***      EKG:  EKG is *** ordered today.  The ekg ordered today is personally reviewed and demonstrates ***  Recent Labs: 11/06/2023: ALT 28; BUN 12; Creatinine, Ser 0.80; Hemoglobin 15.2; Platelets 304; Potassium 4.6; Sodium 139; TSH 1.640  Recent Lipid Panel    Component Value Date/Time   CHOL 233 (H) 07/31/2023 1419   TRIG 250 (H)  07/31/2023 1419   HDL 44 07/31/2023 1419   CHOLHDL 5.3 (H) 07/31/2023 1419   LDLCALC 144 (H) 07/31/2023 1419    Physical Exam:    VS:  There were no vitals taken for this visit.    Wt Readings from Last 3 Encounters:  11/14/23 297 lb (134.7 kg)  11/06/23 297 lb (134.7 kg)  10/04/23 289 lb (131.1 kg)     GEN: *** Well nourished, well developed in no acute distress HEENT: Normal NECK: No JVD; No carotid bruits LYMPHATICS: No lymphadenopathy CARDIAC: ***RRR, no murmurs, rubs, gallops RESPIRATORY:  Clear to auscultation without rales, wheezing or rhonchi  ABDOMEN: Soft, non-tender, non-distended MUSCULOSKELETAL:  No edema; No deformity  SKIN: Warm and dry NEUROLOGIC:  Alert and oriented x 3 PSYCHIATRIC:  Normal affect     Signed, Norman Herrlich, MD  11/14/2023 9:01 AM    [facility]

## 2023-11-14 ENCOUNTER — Telehealth (INDEPENDENT_AMBULATORY_CARE_PROVIDER_SITE_OTHER): Payer: 59 | Admitting: Physician Assistant

## 2023-11-14 ENCOUNTER — Ambulatory Visit: Payer: BC Managed Care – PPO | Admitting: Physician Assistant

## 2023-11-14 ENCOUNTER — Ambulatory Visit: Payer: 59 | Attending: Cardiology | Admitting: Cardiology

## 2023-11-14 VITALS — Ht 70.5 in | Wt 297.0 lb

## 2023-11-14 DIAGNOSIS — D869 Sarcoidosis, unspecified: Secondary | ICD-10-CM

## 2023-11-14 DIAGNOSIS — K760 Fatty (change of) liver, not elsewhere classified: Secondary | ICD-10-CM

## 2023-11-14 DIAGNOSIS — Z87442 Personal history of urinary calculi: Secondary | ICD-10-CM

## 2023-11-14 DIAGNOSIS — E049 Nontoxic goiter, unspecified: Secondary | ICD-10-CM

## 2023-11-14 DIAGNOSIS — J0100 Acute maxillary sinusitis, unspecified: Secondary | ICD-10-CM

## 2023-11-14 DIAGNOSIS — E119 Type 2 diabetes mellitus without complications: Secondary | ICD-10-CM

## 2023-11-14 MED ORDER — CLINDAMYCIN HCL 300 MG PO CAPS: 300.0000 mg | ORAL_CAPSULE | Freq: Three times a day (TID) | ORAL | 0 refills | Status: DC

## 2023-11-14 NOTE — Progress Notes (Signed)
Virtual Visit via Video Note   This visit type was conducted per patient request This format is felt to be appropriate for this patient at this time.  All issues noted in this document were discussed and addressed.  A limited physical exam was performed with this format.  A verbal consent was obtained for the virtual visit.   Date:  11/18/2023   ID:  Holly [name], DOB 11-Jul-1966, MRN [id number]  Patient Location: Home Provider Location: Other:  Home office  PCP:  Langley Gauss, PA   Chief Complaint:  Pain on her face around her eyes.  Discussed the use of AI scribe software for clinical note transcription with the patient, who gave verbal consent to proceed.  Discussed the use of AI scribe software for clinical note transcription with the patient, who gave verbal consent to proceed.  History of Present Illness   The patient, with a history of sarcoidosis, presents with persistent sinus congestion despite a recent course of azithromycin and prednisone. They report that their symptoms improved initially but worsened rapidly a day after completing the antibiotic course. They describe significant sinus pain and copious drainage, with some yellow discoloration.  In addition to their sinus symptoms, they have been using their inhaler more frequently due to a cough and chest discomfort. They have resumed their sarcoidosis medication and have been using a rescue inhaler, which seems to have helped. They report no rattling in their chest and believe the cough is due to sinus drainage.  They also mention a recent chest x-ray, which showed resolution of a previous issue.          History of Present Illness:    Holly [name] is a 58 y.o. female with sinus pain, nasal congestion, dry cough  No chief complaint on file. .  The patient does not have symptoms concerning for COVID-19 infection (fever, chills, cough, or new shortness of breath).   She mentioned to take 3 rounds of antibiotics,  and she still feels awfuls. Nasal congestion, pain on her face around her eyes.  Past Medical History:  Diagnosis Date   Acute pain of left shoulder 09/12/2023   Allergic rhinitis 05/26/2019   Anxiety    At risk for obstructive sleep apnea 09/28/2020   BMI 40.0-44.9, adult (HCC) 08/02/2020   Cellulitis of foot, right 09/24/2023   COVID-19 virus detected 12/30/2019   10/30/2019-SARS-CoV-2-positive Didn't require hospitalization     Depression    Diabetes mellitus without complication (HCC)    Diabetic glomerulopathy (HCC)    Diabetic polyneuropathy (HCC) 08/02/2020   Fatty liver    Fracture of intermediate cuneiform bone of right foot 09/16/2023   Generalized abdominal pain 01/09/2023   Goiter    Hashimoto's thyroiditis    Hilar adenopathy 03/20/19  04/11/19   CTA CHEST and PET per DR. Anne Ng [facility]   History of kidney stones    Hospital discharge follow-up 09/24/2023   Hypertension    Hypothyroidism (acquired) 08/02/2020   Insomnia 11/12/2022   Mediastinal adenopathy    PER CTA CHEST PET SCAN per DR. 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 Heath Gold [facility]     Severe recurrent major depression without psychotic features (HCC)    Strain of lumbar region 09/10/2022  Supraclavicular adenopathy 03/20/2019   PER CTA CHEST and PET per DR. Sudie Bailey [facility]   Type 2 diabetes mellitus with hyperglycemia, without long-term current use of insulin (HCC) 08/10/2023   Vaginal yeast infection 07/31/2023   Vitamin D deficiency 12/09/2022    Past Surgical History:  Procedure Laterality Date   ABDOMINAL HYSTERECTOMY     COMPLETE   ADHESIOLYSIS     APPENDECTOMY     CESAREAN SECTION     MEDIASTINOSCOPY N/A 04/18/2019   Procedure: MEDIASTINOSCOPY;   Surgeon: Delight Ovens, MD;  Location: [facility] OR;  Service: Thoracic;  Laterality: N/A;   VIDEO BRONCHOSCOPY WITH ENDOBRONCHIAL ULTRASOUND N/A 04/18/2019   Procedure: VIDEO BRONCHOSCOPY WITH ENDOBRONCHIAL ULTRASOUND;  Surgeon: Delight Ovens, MD;  Location: [facility] OR;  Service: Thoracic;  Laterality: N/A;    Family History  Problem Relation Age of Onset   Breast cancer Mother    Anuerysm Mother        BRAIN   Cancer Mother        BREAST   COPD Father    Hyperlipidemia Father    Congestive Heart Failure Father    Bipolar disorder Sister    Diabetes Sister    Fibromyalgia Sister    Cancer Paternal Uncle        LUNG   Breast cancer Maternal Grandmother    Cancer Maternal Grandmother 17       BREAST   Cancer Paternal Grandfather        LUNG    Social History   Socioeconomic History   Marital status: Divorced    Spouse name: Not on file   Number of children: Not on file   Years of education: Not on file   Highest education level: Master's degree (e.g., MA, MS, MEng, MEd, MSW, MBA)  Occupational History   Not on file  Tobacco Use   Smoking status: Never   Smokeless tobacco: Never  Vaping Use   Vaping status: Never Used  Substance and Sexual Activity   Alcohol use: Yes    Comment: VERY RARE OCCASION   Drug use: Never   Sexual activity: Not Currently  Other Topics Concern   Not on file  Social History Narrative   Not on file   Social Drivers of Health   Financial Resource Strain: Medium Risk (09/30/2023)   Overall Financial Resource Strain (CARDIA)    Difficulty of Paying Living Expenses: Somewhat hard  Food Insecurity: Food Insecurity Present (09/30/2023)   Hunger Vital Sign    Worried About Running Out of Food in the Last Year: Sometimes true    Ran Out of Food in the Last Year: Never true  Transportation Needs: No Transportation Needs (09/30/2023)   PRAPARE - Administrator, Civil Service (Medical): No    Lack of Transportation (Non-Medical): No   Physical Activity: Insufficiently Active (09/30/2023)   Exercise Vital Sign    Days of Exercise per Week: 2 days    Minutes of Exercise per Session: 20 min  Stress: Stress Concern Present (09/30/2023)   [employer] of Occupational Health - Occupational Stress Questionnaire    Feeling of Stress : To some extent  Social Connections: Moderately Integrated (09/30/2023)   Social Connection and Isolation Panel [NHANES]    Frequency of Communication with Friends and Family: More than three times a week    Frequency of Social Gatherings with Friends and Family: Once a week    Attends Religious Services: More than 4 times per year  Active Member of Clubs or Organizations: Yes    Attends Banker Meetings: 1 to 4 times per year    Marital Status: Divorced  Intimate Partner Violence: Not At Risk (12/08/2022)   Humiliation, Afraid, Rape, and Kick questionnaire    Fear of Current or Ex-Partner: No    Emotionally Abused: No    Physically Abused: No    Sexually Abused: No    Outpatient Medications Prior to Visit  Medication Sig Dispense Refill   albuterol (VENTOLIN HFA) 108 (90 Base) MCG/ACT inhaler TAKE 2 PUFFS BY MOUTH EVERY 6 HOURS AS NEEDED FOR WHEEZE OR SHORTNESS OF BREATH 18 each 2   budesonide-formoterol (SYMBICORT) 160-4.5 MCG/ACT inhaler Inhale 2 puffs into the lungs 2 (two) times daily. 1 each 3   buPROPion (WELLBUTRIN XL) 150 MG 24 hr tablet TAKE 1 TABLET BY MOUTH EVERY DAY 90 tablet 0   busPIRone (BUSPAR) 7.5 MG tablet TAKE 1 TABLET BY MOUTH TWICE A DAY 180 tablet 1   clonazePAM (KLONOPIN) 0.5 MG tablet Take 1 tablet (0.5 mg total) by mouth 2 (two) times daily as needed for anxiety. 10 tablet 1   cyclobenzaprine (FLEXERIL) 5 MG tablet Take 1 tablet (5 mg total) by mouth 3 (three) times daily as needed for muscle spasms. 60 tablet 1   famotidine (PEPCID) 40 MG tablet TAKE 1 TABLET BY MOUTH TWICE A DAY 180 tablet 1   FARXIGA 5 MG TABS tablet TAKE 1 TABLET BY MOUTH EVERY  DAY BEFORE BREAKFAST 90 tablet 1   fluconazole (DIFLUCAN) 150 MG tablet Take 1 tablet (150 mg total) by mouth daily. 1 tablet 1   fluticasone (FLONASE) 50 MCG/ACT nasal spray Place 2 sprays into both nostrils daily. 16 g 6   gabapentin (NEURONTIN) 300 MG capsule TAKE 2 CAPSULES BY MOUTH 2 TIMES DAILY. 360 capsule 0   irbesartan (AVAPRO) 75 MG tablet TAKE 1 TABLET BY MOUTH EVERY DAY 90 tablet 0   levothyroxine (SYNTHROID) 25 MCG tablet TAKE 1 TABLET BY MOUTH EVERY DAY BEFORE BREAKFAST 90 tablet 0   metFORMIN (GLUCOPHAGE) 1000 MG tablet Take 1 tablet (1,000 mg total) by mouth 2 (two) times daily with a meal. 180 tablet 2   mometasone (NASONEX) 50 MCG/ACT nasal spray Place 2 sprays into the nose daily. 17 g 12   Multiple Vitamin (MULTI-VITAMIN) tablet Take 1 tablet by mouth daily.     omeprazole (PRILOSEC) 40 MG capsule 1 (ONE) CAPSULE EVERY MORNING 30 MINUTES BEFORE BREAKFAST 90 capsule 1   predniSONE (DELTASONE) 20 MG tablet 1 po tid for 3 days then 1 po bid for 3 days then 1 po qd for 3 days 18 tablet 0   SODIUM FLUORIDE 5000 PPM 1.1 % PSTE See admin instructions.     tirzepatide [facility]) 5 MG/0.5ML Pen Inject 5 mg into the skin once a week. 6 mL 1   traZODone (DESYREL) 150 MG tablet TAKE 1 TABLET BY MOUTH EVERYDAY AT BEDTIME 90 tablet 0   venlafaxine XR (EFFEXOR-XR) 75 MG 24 hr capsule TAKE 3 CAPSULES BY MOUTH DAILY IN THE MORNING 270 capsule 1   Vitamin D, Ergocalciferol, (DRISDOL) 1.25 MG (50000 UNIT) CAPS capsule Take 1 capsule (50,000 Units total) by mouth every 7 (seven) days. 5 capsule 2   No facility-administered medications prior to visit.    Allergies  Allergen Reactions   Moxifloxacin Anaphylaxis, Other (See Comments) and Hives   Avelox [Moxifloxacin Hcl In Nacl]    Cymbalta [Duloxetine Hcl]    Rybrevant [Amivantamab-Vmjw]  Duloxetine Hives   Oxycodone-Acetaminophen Hives     Social History   Tobacco Use   Smoking status: Never   Smokeless tobacco: Never  Vaping Use    Vaping status: Never Used  Substance Use Topics   Alcohol use: Yes    Comment: VERY RARE OCCASION   Drug use: Never     Review of Systems  Constitutional:  Negative for chills and fever.  HENT:  Positive for congestion, sinus pain and sore throat. Negative for hearing loss.   Eyes:  Negative for pain.  Respiratory:  Negative for cough, sputum production and shortness of breath.   Cardiovascular:  Negative for chest pain and palpitations.  Gastrointestinal:  Negative for abdominal pain, diarrhea, heartburn and vomiting.  Genitourinary:  Negative for dysuria and frequency.  Musculoskeletal:  Negative for joint pain and myalgias.  Skin:  Negative for rash.     Labs/Other Tests and Data Reviewed:    Recent Labs: 11/06/2023: ALT 28; BUN 12; Creatinine, Ser 0.80; Hemoglobin 15.2; Platelets 304; Potassium 4.6; Sodium 139; TSH 1.640   Recent Lipid Panel Lab Results  Component Value Date/Time   CHOL 233 (H) 07/31/2023 02:19 PM   TRIG 250 (H) 07/31/2023 02:19 PM   HDL 44 07/31/2023 02:19 PM   CHOLHDL 5.3 (H) 07/31/2023 02:19 PM   LDLCALC 144 (H) 07/31/2023 02:19 PM    Wt Readings from Last 3 Encounters:  11/14/23 297 lb (134.7 kg)  11/06/23 297 lb (134.7 kg)  10/04/23 289 lb (131.1 kg)     Objective:    Vital Signs:  Ht 5' 10.5" (1.791 m)   Wt 297 lb (134.7 kg)   BMI 42.01 kg/m    Physical Exam  Unable to perform due to video visit  ASSESSMENT & PLAN:   Acute non-recurrent maxillary sinusitis Assessment & Plan: Persistent sinus congestion and pain despite recent course of azithromycin and prednisone. Drainage is significant with some yellow coloration. Previous course of cefdinir did not provide significant relief. -Prescribe clindamycin TID for 10 days.  Orders: -     Clindamycin HCl; Take 1 capsule (300 mg total) by mouth 3 (three) times daily.  Dispense: 30 capsule; Refill: 0  Sarcoidosis Assessment & Plan: Increased use of rescue inhaler likely due to recent  chest symptoms. Symptoms seem to have improved with consistent use of inhaler. -Continue current inhaler regimen. -Monitor for any changes in symptoms or increased need for rescue inhaler.      No orders of the defined types were placed in this encounter.    Meds ordered this encounter  Medications   clindamycin (CLEOCIN) 300 MG capsule    Sig: Take 1 capsule (300 mg total) by mouth 3 (three) times daily.    Dispense:  30 capsule    Refill:  0         I spent < 15 > minutes dedicated to the care of this patient on the date of this encounter to include chart review time with the patient.  Follow Up:  In Person prn  Signed, Langley Gauss, [place]  11/18/2023 2:50 PM    [facility]

## 2023-11-18 ENCOUNTER — Encounter: Payer: Self-pay | Admitting: Physician Assistant

## 2023-11-18 NOTE — Assessment & Plan Note (Signed)
Increased use of rescue inhaler likely due to recent chest symptoms. Symptoms seem to have improved with consistent use of inhaler. -Continue current inhaler regimen. -Monitor for any changes in symptoms or increased need for rescue inhaler.

## 2023-11-18 NOTE — Assessment & Plan Note (Signed)
Persistent sinus congestion and pain despite recent course of azithromycin and prednisone. Drainage is significant with some yellow coloration. Previous course of cefdinir did not provide significant relief. -Prescribe clindamycin TID for 10 days.

## 2023-11-26 ENCOUNTER — Encounter: Payer: Self-pay | Admitting: Physician Assistant

## 2023-11-26 ENCOUNTER — Ambulatory Visit: Payer: 59 | Admitting: Physician Assistant

## 2023-11-26 VITALS — BP 124/74 | Temp 97.2°F | Ht 70.5 in | Wt 294.0 lb

## 2023-11-26 DIAGNOSIS — T50905A Adverse effect of unspecified drugs, medicaments and biological substances, initial encounter: Secondary | ICD-10-CM | POA: Insufficient documentation

## 2023-11-26 DIAGNOSIS — J0100 Acute maxillary sinusitis, unspecified: Secondary | ICD-10-CM

## 2023-11-26 DIAGNOSIS — L500 Allergic urticaria: Secondary | ICD-10-CM | POA: Diagnosis not present

## 2023-11-26 HISTORY — DX: Adverse effect of unspecified drugs, medicaments and biological substances, initial encounter: T50.905A

## 2023-11-26 MED ORDER — PREDNISONE 20 MG PO TABS: ORAL_TABLET | ORAL | 0 refills | Status: DC

## 2023-11-26 MED ORDER — TRIAMCINOLONE ACETONIDE 40 MG/ML IJ SUSP
80.0000 mg | Freq: Once | INTRAMUSCULAR | Status: AC
  Administered 2023-11-26: 80 mg via INTRAMUSCULAR

## 2023-11-26 NOTE — Progress Notes (Unsigned)
Acute Office Visit  Subjective:    Patient ID: Holly [name], female    DOB: July 10, 1966, 58 y.o.   MRN: [id number]  Chief Complaint  Patient presents with   Rash    Discussed the use of AI scribe software for clinical note transcription with the patient, who gave verbal consent to proceed.  HPI: Patient is in today for full body rash  Past Medical History:  Diagnosis Date   Acute pain of left shoulder 09/12/2023   Allergic rhinitis 05/26/2019   Anxiety    At risk for obstructive sleep apnea 09/28/2020   BMI 40.0-44.9, adult (HCC) 08/02/2020   Cellulitis of foot, right 09/24/2023   COVID-19 virus detected 12/30/2019   10/30/2019-SARS-CoV-2-positive Didn't require hospitalization     Depression    Diabetes mellitus without complication (HCC)    Diabetic glomerulopathy (HCC)    Diabetic polyneuropathy (HCC) 08/02/2020   Fatty liver    Fracture of intermediate cuneiform bone of right foot 09/16/2023   Generalized abdominal pain 01/09/2023   Goiter    Hashimoto's thyroiditis    Hilar adenopathy 03/20/19  04/11/19   CTA CHEST and PET per DR. Anne Ng LEWIS   History of kidney stones    Hospital discharge follow-up 09/24/2023   Hypertension    Hypothyroidism (acquired) 08/02/2020   Insomnia 11/12/2022   Mediastinal adenopathy    PER CTA CHEST PET SCAN per DR. 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 Heath Gold LEWIS     Severe recurrent major depression without psychotic features (HCC)    Strain of lumbar region 09/10/2022   Supraclavicular adenopathy 03/20/2019   PER CTA CHEST and PET per DR. Sudie Bailey LEWIS   Type 2 diabetes mellitus with hyperglycemia, without long-term current use of insulin (HCC) 08/10/2023   Vaginal yeast  infection 07/31/2023   Vitamin D deficiency 12/09/2022    Past Surgical History:  Procedure Laterality Date   ABDOMINAL HYSTERECTOMY     COMPLETE   ADHESIOLYSIS     APPENDECTOMY     CESAREAN SECTION     MEDIASTINOSCOPY N/A 04/18/2019   Procedure: MEDIASTINOSCOPY;  Surgeon: Delight Ovens, MD;  Location: [facility] OR;  Service: Thoracic;  Laterality: N/A;   VIDEO BRONCHOSCOPY WITH ENDOBRONCHIAL ULTRASOUND N/A 04/18/2019   Procedure: VIDEO BRONCHOSCOPY WITH ENDOBRONCHIAL ULTRASOUND;  Surgeon: Delight Ovens, MD;  Location: [facility] OR;  Service: Thoracic;  Laterality: N/A;    Family History  Problem Relation Age of Onset   Breast cancer Mother    Anuerysm Mother        BRAIN   Cancer Mother        BREAST   COPD Father    Hyperlipidemia Father    Congestive Heart Failure Father    Bipolar disorder Sister    Diabetes Sister    Fibromyalgia Sister    Cancer Paternal Uncle        LUNG   Breast cancer Maternal Grandmother    Cancer Maternal Grandmother 72       BREAST   Cancer Paternal Grandfather        LUNG    Social History   Socioeconomic History   Marital status: Divorced    Spouse name: Not on file   Number of children: Not on file  Years of education: Not on file   Highest education level: Master's degree (e.g., MA, MS, MEng, MEd, MSW, MBA)  Occupational History   Not on file  Tobacco Use   Smoking status: Never   Smokeless tobacco: Never  Vaping Use   Vaping status: Never Used  Substance and Sexual Activity   Alcohol use: Yes    Comment: VERY RARE OCCASION   Drug use: Never   Sexual activity: Not Currently  Other Topics Concern   Not on file  Social History Narrative   Not on file   Social Drivers of Health   Financial Resource Strain: Medium Risk (09/30/2023)   Overall Financial Resource Strain (CARDIA)    Difficulty of Paying Living Expenses: Somewhat hard  Food Insecurity: Food Insecurity Present (09/30/2023)   Hunger Vital Sign    Worried About  Running Out of Food in the Last Year: Sometimes true    Ran Out of Food in the Last Year: Never true  Transportation Needs: No Transportation Needs (09/30/2023)   PRAPARE - Administrator, Civil Service (Medical): No    Lack of Transportation (Non-Medical): No  Physical Activity: Insufficiently Active (09/30/2023)   Exercise Vital Sign    Days of Exercise per Week: 2 days    Minutes of Exercise per Session: 20 min  Stress: Stress Concern Present (09/30/2023)   [employer] of Occupational Health - Occupational Stress Questionnaire    Feeling of Stress : To some extent  Social Connections: Moderately Integrated (09/30/2023)   Social Connection and Isolation Panel [NHANES]    Frequency of Communication with Friends and Family: More than three times a week    Frequency of Social Gatherings with Friends and Family: Once a week    Attends Religious Services: More than 4 times per year    Active Member of [employer] or Organizations: Yes    Attends Banker Meetings: 1 to 4 times per year    Marital Status: Divorced  Intimate Partner Violence: Not At Risk (12/08/2022)   Humiliation, Afraid, Rape, and Kick questionnaire    Fear of Current or Ex-Partner: No    Emotionally Abused: No    Physically Abused: No    Sexually Abused: No    Outpatient Medications Prior to Visit  Medication Sig Dispense Refill   albuterol (VENTOLIN HFA) 108 (90 Base) MCG/ACT inhaler TAKE 2 PUFFS BY MOUTH EVERY 6 HOURS AS NEEDED FOR WHEEZE OR SHORTNESS OF BREATH 18 each 2   budesonide-formoterol (SYMBICORT) 160-4.5 MCG/ACT inhaler Inhale 2 puffs into the lungs 2 (two) times daily. 1 each 3   buPROPion (WELLBUTRIN XL) 150 MG 24 hr tablet TAKE 1 TABLET BY MOUTH EVERY DAY 90 tablet 0   busPIRone (BUSPAR) 7.5 MG tablet TAKE 1 TABLET BY MOUTH TWICE A DAY 180 tablet 1   clonazePAM (KLONOPIN) 0.5 MG tablet Take 1 tablet (0.5 mg total) by mouth 2 (two) times daily as needed for anxiety. 10 tablet 1    cyclobenzaprine (FLEXERIL) 5 MG tablet Take 1 tablet (5 mg total) by mouth 3 (three) times daily as needed for muscle spasms. 60 tablet 1   famotidine (PEPCID) 40 MG tablet TAKE 1 TABLET BY MOUTH TWICE A DAY 180 tablet 1   FARXIGA 5 MG TABS tablet TAKE 1 TABLET BY MOUTH EVERY DAY BEFORE BREAKFAST 90 tablet 1   fluconazole (DIFLUCAN) 150 MG tablet Take 1 tablet (150 mg total) by mouth daily. 1 tablet 1   fluticasone (FLONASE) 50 MCG/ACT nasal  spray Place 2 sprays into both nostrils daily. 16 g 6   gabapentin (NEURONTIN) 300 MG capsule TAKE 2 CAPSULES BY MOUTH 2 TIMES DAILY. 360 capsule 0   irbesartan (AVAPRO) 75 MG tablet TAKE 1 TABLET BY MOUTH EVERY DAY 90 tablet 0   levothyroxine (SYNTHROID) 25 MCG tablet TAKE 1 TABLET BY MOUTH EVERY DAY BEFORE BREAKFAST 90 tablet 0   metFORMIN (GLUCOPHAGE) 1000 MG tablet Take 1 tablet (1,000 mg total) by mouth 2 (two) times daily with a meal. 180 tablet 2   mometasone (NASONEX) 50 MCG/ACT nasal spray Place 2 sprays into the nose daily. 17 g 12   Multiple Vitamin (MULTI-VITAMIN) tablet Take 1 tablet by mouth daily.     omeprazole (PRILOSEC) 40 MG capsule 1 (ONE) CAPSULE EVERY MORNING 30 MINUTES BEFORE BREAKFAST 90 capsule 1   SODIUM FLUORIDE 5000 PPM 1.1 % PSTE See admin instructions.     tirzepatide [facility]) 5 MG/0.5ML Pen Inject 5 mg into the skin once a week. 6 mL 1   traZODone (DESYREL) 150 MG tablet TAKE 1 TABLET BY MOUTH EVERYDAY AT BEDTIME 90 tablet 0   venlafaxine XR (EFFEXOR-XR) 75 MG 24 hr capsule TAKE 3 CAPSULES BY MOUTH DAILY IN THE MORNING 270 capsule 1   Vitamin D, Ergocalciferol, (DRISDOL) 1.25 MG (50000 UNIT) CAPS capsule Take 1 capsule (50,000 Units total) by mouth every 7 (seven) days. 5 capsule 2   clindamycin (CLEOCIN) 300 MG capsule Take 1 capsule (300 mg total) by mouth 3 (three) times daily. 30 capsule 0   predniSONE (DELTASONE) 20 MG tablet 1 po tid for 3 days then 1 po bid for 3 days then 1 po qd for 3 days 18 tablet 0   No  facility-administered medications prior to visit.    Allergies  Allergen Reactions   Moxifloxacin Anaphylaxis, Other (See Comments) and Hives   Avelox [Moxifloxacin Hcl In Nacl]    Cymbalta [Duloxetine Hcl]    Rybrevant [Amivantamab-Vmjw]    Duloxetine Hives   Oxycodone-Acetaminophen Hives    Review of Systems  Constitutional:  Negative for appetite change, fatigue and fever.  HENT:  Negative for congestion, ear pain, sinus pressure and sore throat.   Respiratory:  Negative for cough, chest tightness, shortness of breath and wheezing.   Cardiovascular:  Negative for chest pain and palpitations.  Gastrointestinal:  Negative for abdominal pain, constipation, diarrhea, nausea and vomiting.  Genitourinary:  Negative for dysuria and hematuria.  Musculoskeletal:  Negative for arthralgias, back pain, joint swelling and myalgias.  Skin:  Positive for rash.  Neurological:  Negative for dizziness, weakness and headaches.  Psychiatric/Behavioral:  Negative for dysphoric mood. The patient is not nervous/anxious.        Objective:        11/26/2023    2:45 PM 11/14/2023    8:43 AM 11/06/2023    2:32 PM  Vitals with BMI  Height 5' 10.5" 5' 10.5" 5' 10.5"  Weight 294 lbs 297 lbs 297 lbs  BMI 41.57 42 42  Systolic 124  130  Diastolic 74  80  Pulse   92    Orthostatic VS for the past 72 hrs (Last 3 readings):  Patient Position BP Location  11/26/23 1445 Sitting Left Arm     Physical Exam  Health Maintenance Due  Topic Date Due   OPHTHALMOLOGY EXAM  Never done   Pneumococcal Vaccine 71-56 Years old (2 of 2 - PCV) 08/02/2021   Zoster Vaccines- Shingrix (2 of 2) 08/05/2021   COVID-19  Vaccine (4 - 2024-25 season) 06/24/2023   FOOT EXAM  09/06/2023   Diabetic kidney evaluation - Urine ACR  12/09/2023    There are no preventive care reminders to display for this patient.   Lab Results  Component Value Date   TSH 1.640 11/06/2023   Lab Results  Component Value Date   WBC  11.6 (H) 11/06/2023   HGB 15.2 11/06/2023   HCT 45.6 11/06/2023   MCV 91 11/06/2023   PLT 304 11/06/2023   Lab Results  Component Value Date   NA 139 11/06/2023   K 4.6 11/06/2023   CO2 25 11/06/2023   GLUCOSE 114 (H) 11/06/2023   BUN 12 11/06/2023   CREATININE 0.80 11/06/2023   BILITOT 0.3 11/06/2023   ALKPHOS 111 11/06/2023   AST 24 11/06/2023   ALT 28 11/06/2023   PROT 6.9 11/06/2023   ALBUMIN 4.3 11/06/2023   CALCIUM 9.6 11/06/2023   ANIONGAP 11 04/16/2019   EGFR 86 11/06/2023   GFR 71.87 12/31/2019   Lab Results  Component Value Date   CHOL 233 (H) 07/31/2023   Lab Results  Component Value Date   HDL 44 07/31/2023   Lab Results  Component Value Date   LDLCALC 144 (H) 07/31/2023   Lab Results  Component Value Date   TRIG 250 (H) 07/31/2023   Lab Results  Component Value Date   CHOLHDL 5.3 (H) 07/31/2023   Lab Results  Component Value Date   HGBA1C 10.0 (H) 07/31/2023    Media Information  Document Information  Photos    11/26/2023 14:56  Attached To:  Office Visit on 11/26/23 with Langley Gauss, PA  Source Information  Langley Gauss, [place]  [facility]  Document History      Media Information  Document Information  Photos    11/26/2023 14:56  Attached To:  Office Visit on 11/26/23 with Langley Gauss, PA  Source Information  Langley Gauss, [place]  [facility]  Document History       Assessment & Plan:  There are no diagnoses linked to this encounter.   No orders of the defined types were placed in this encounter.   No orders of the defined types were placed in this encounter.    Follow-up: No follow-ups on file.  An After Visit Summary was printed and given to the patient.     I,Lauren M Auman,acting as a Neurosurgeon for [employer], PA.,have documented all relevant documentation on the behalf of Langley Gauss, PA,as directed by  Langley Gauss, PA while in the presence of Langley Gauss, [place].   Langley Gauss, [place] [facility] [phone]

## 2023-11-26 NOTE — Progress Notes (Deleted)
Virtual Visit via Video Note   This visit type was conducted per patient request This format is felt to be appropriate for this patient at this time.  All issues noted in this document were discussed and addressed.  A limited physical exam was performed with this format.  A verbal consent was obtained for the virtual visit.   Date:  11/26/2023   ID:  Holly [name], DOB 1966/02/25, MRN [id number]  Patient Location: Home Provider Location: Office/Clinic  PCP:  Langley Gauss, PA   Chief Complaint:  ***  Discussed the use of AI scribe software for clinical note transcription with the patient, who gave verbal consent to proceed.       History of Present Illness:    Holly [name] is a 58 y.o. female with ***  Chief Complaint  Patient presents with   Rash  .  The patient {does/does not:200015} have symptoms concerning for COVID-19 infection (fever, chills, cough, or new shortness of breath).    Past Medical History:  Diagnosis Date   Acute pain of left shoulder 09/12/2023   Allergic rhinitis 05/26/2019   Anxiety    At risk for obstructive sleep apnea 09/28/2020   BMI 40.0-44.9, adult (HCC) 08/02/2020   Cellulitis of foot, right 09/24/2023   COVID-19 virus detected 12/30/2019   10/30/2019-SARS-CoV-2-positive Didn't require hospitalization     Depression    Diabetes mellitus without complication (HCC)    Diabetic glomerulopathy (HCC)    Diabetic polyneuropathy (HCC) 08/02/2020   Fatty liver    Fracture of intermediate cuneiform bone of right foot 09/16/2023   Generalized abdominal pain 01/09/2023   Goiter    Hashimoto's thyroiditis    Hilar adenopathy 03/20/19  04/11/19   CTA CHEST and PET per DR. Anne Ng LEWIS   History of kidney stones    Hospital discharge follow-up 09/24/2023   Hypertension    Hypothyroidism (acquired) 08/02/2020   Insomnia 11/12/2022   Mediastinal adenopathy    PER CTA CHEST PET SCAN per DR. Lanae Crumbly LEWIS   Mixed hyperlipidemia 08/02/2020    Moderate recurrent major depression (HCC) 07/09/2017   Morbid obesity (HCC) 08/02/2020   OSA (obstructive sleep apnea) 11/23/2020   Pharyngoesophageal dysphagia 01/09/2023   Pneumonia    Sarcoidosis 03/01/2019   PER PET SCAN per DR. Heath Gold LEWIS     Severe recurrent major depression without psychotic features (HCC)    Strain of lumbar region 09/10/2022   Supraclavicular adenopathy 03/20/2019   PER CTA CHEST and PET per DR. Sudie Bailey LEWIS   Type 2 diabetes mellitus with hyperglycemia, without long-term current use of insulin (HCC) 08/10/2023   Vaginal yeast infection 07/31/2023   Vitamin D deficiency 12/09/2022    Past Surgical History:  Procedure Laterality Date   ABDOMINAL HYSTERECTOMY     COMPLETE   ADHESIOLYSIS     APPENDECTOMY     CESAREAN SECTION     MEDIASTINOSCOPY N/A 04/18/2019   Procedure: MEDIASTINOSCOPY;  Surgeon: Delight Ovens, MD;  Location: [facility] OR;  Service: Thoracic;  Laterality: N/A;   VIDEO BRONCHOSCOPY WITH ENDOBRONCHIAL ULTRASOUND N/A 04/18/2019   Procedure: VIDEO BRONCHOSCOPY WITH ENDOBRONCHIAL ULTRASOUND;  Surgeon: Delight Ovens, MD;  Location: [facility] OR;  Service: Thoracic;  Laterality: N/A;    Family History  Problem Relation Age of Onset   Breast cancer Mother    Anuerysm Mother        BRAIN   Cancer Mother        BREAST   COPD Father  Hyperlipidemia Father    Congestive Heart Failure Father    Bipolar disorder Sister    Diabetes Sister    Fibromyalgia Sister    Cancer Paternal Uncle        LUNG   Breast cancer Maternal Grandmother    Cancer Maternal Grandmother 26       BREAST   Cancer Paternal Grandfather        LUNG    Social History   Socioeconomic History   Marital status: Divorced    Spouse name: Not on file   Number of children: Not on file   Years of education: Not on file   Highest education level: Master's degree (e.g., MA, MS, MEng, MEd, MSW, MBA)  Occupational History   Not on file  Tobacco Use   Smoking status:  Never   Smokeless tobacco: Never  Vaping Use   Vaping status: Never Used  Substance and Sexual Activity   Alcohol use: Yes    Comment: VERY RARE OCCASION   Drug use: Never   Sexual activity: Not Currently  Other Topics Concern   Not on file  Social History Narrative   Not on file   Social Drivers of Health   Financial Resource Strain: Medium Risk (09/30/2023)   Overall Financial Resource Strain (CARDIA)    Difficulty of Paying Living Expenses: Somewhat hard  Food Insecurity: Food Insecurity Present (09/30/2023)   Hunger Vital Sign    Worried About Running Out of Food in the Last Year: Sometimes true    Ran Out of Food in the Last Year: Never true  Transportation Needs: No Transportation Needs (09/30/2023)   PRAPARE - Administrator, Civil Service (Medical): No    Lack of Transportation (Non-Medical): No  Physical Activity: Insufficiently Active (09/30/2023)   Exercise Vital Sign    Days of Exercise per Week: 2 days    Minutes of Exercise per Session: 20 min  Stress: Stress Concern Present (09/30/2023)   [employer] of Occupational Health - Occupational Stress Questionnaire    Feeling of Stress : To some extent  Social Connections: Moderately Integrated (09/30/2023)   Social Connection and Isolation Panel [NHANES]    Frequency of Communication with Friends and Family: More than three times a week    Frequency of Social Gatherings with Friends and Family: Once a week    Attends Religious Services: More than 4 times per year    Active Member of [employer] or Organizations: Yes    Attends Banker Meetings: 1 to 4 times per year    Marital Status: Divorced  Intimate Partner Violence: Not At Risk (12/08/2022)   Humiliation, Afraid, Rape, and Kick questionnaire    Fear of Current or Ex-Partner: No    Emotionally Abused: No    Physically Abused: No    Sexually Abused: No    Outpatient Medications Prior to Visit  Medication Sig Dispense Refill   albuterol  (VENTOLIN HFA) 108 (90 Base) MCG/ACT inhaler TAKE 2 PUFFS BY MOUTH EVERY 6 HOURS AS NEEDED FOR WHEEZE OR SHORTNESS OF BREATH 18 each 2   budesonide-formoterol (SYMBICORT) 160-4.5 MCG/ACT inhaler Inhale 2 puffs into the lungs 2 (two) times daily. 1 each 3   buPROPion (WELLBUTRIN XL) 150 MG 24 hr tablet TAKE 1 TABLET BY MOUTH EVERY DAY 90 tablet 0   busPIRone (BUSPAR) 7.5 MG tablet TAKE 1 TABLET BY MOUTH TWICE A DAY 180 tablet 1   clindamycin (CLEOCIN) 300 MG capsule Take 1 capsule (300 mg  total) by mouth 3 (three) times daily. 30 capsule 0   clonazePAM (KLONOPIN) 0.5 MG tablet Take 1 tablet (0.5 mg total) by mouth 2 (two) times daily as needed for anxiety. 10 tablet 1   cyclobenzaprine (FLEXERIL) 5 MG tablet Take 1 tablet (5 mg total) by mouth 3 (three) times daily as needed for muscle spasms. 60 tablet 1   famotidine (PEPCID) 40 MG tablet TAKE 1 TABLET BY MOUTH TWICE A DAY 180 tablet 1   FARXIGA 5 MG TABS tablet TAKE 1 TABLET BY MOUTH EVERY DAY BEFORE BREAKFAST 90 tablet 1   fluconazole (DIFLUCAN) 150 MG tablet Take 1 tablet (150 mg total) by mouth daily. 1 tablet 1   fluticasone (FLONASE) 50 MCG/ACT nasal spray Place 2 sprays into both nostrils daily. 16 g 6   gabapentin (NEURONTIN) 300 MG capsule TAKE 2 CAPSULES BY MOUTH 2 TIMES DAILY. 360 capsule 0   irbesartan (AVAPRO) 75 MG tablet TAKE 1 TABLET BY MOUTH EVERY DAY 90 tablet 0   levothyroxine (SYNTHROID) 25 MCG tablet TAKE 1 TABLET BY MOUTH EVERY DAY BEFORE BREAKFAST 90 tablet 0   metFORMIN (GLUCOPHAGE) 1000 MG tablet Take 1 tablet (1,000 mg total) by mouth 2 (two) times daily with a meal. 180 tablet 2   mometasone (NASONEX) 50 MCG/ACT nasal spray Place 2 sprays into the nose daily. 17 g 12   Multiple Vitamin (MULTI-VITAMIN) tablet Take 1 tablet by mouth daily.     omeprazole (PRILOSEC) 40 MG capsule 1 (ONE) CAPSULE EVERY MORNING 30 MINUTES BEFORE BREAKFAST 90 capsule 1   predniSONE (DELTASONE) 20 MG tablet 1 po tid for 3 days then 1 po bid for  3 days then 1 po qd for 3 days 18 tablet 0   SODIUM FLUORIDE 5000 PPM 1.1 % PSTE See admin instructions.     tirzepatide [facility]) 5 MG/0.5ML Pen Inject 5 mg into the skin once a week. 6 mL 1   traZODone (DESYREL) 150 MG tablet TAKE 1 TABLET BY MOUTH EVERYDAY AT BEDTIME 90 tablet 0   venlafaxine XR (EFFEXOR-XR) 75 MG 24 hr capsule TAKE 3 CAPSULES BY MOUTH DAILY IN THE MORNING 270 capsule 1   Vitamin D, Ergocalciferol, (DRISDOL) 1.25 MG (50000 UNIT) CAPS capsule Take 1 capsule (50,000 Units total) by mouth every 7 (seven) days. 5 capsule 2   No facility-administered medications prior to visit.    Allergies  Allergen Reactions   Moxifloxacin Anaphylaxis, Other (See Comments) and Hives   Avelox [Moxifloxacin Hcl In Nacl]    Cymbalta [Duloxetine Hcl]    Rybrevant [Amivantamab-Vmjw]    Duloxetine Hives   Oxycodone-Acetaminophen Hives     Social History   Tobacco Use   Smoking status: Never   Smokeless tobacco: Never  Vaping Use   Vaping status: Never Used  Substance Use Topics   Alcohol use: Yes    Comment: VERY RARE OCCASION   Drug use: Never     Review of Systems  Constitutional:  Negative for fever and malaise/fatigue.  HENT:  Negative for sore throat.   Respiratory:  Negative for cough and shortness of breath.   Cardiovascular:  Negative for chest pain.  Gastrointestinal:  Negative for abdominal pain, constipation and diarrhea.  Genitourinary:  Negative for dysuria.  Musculoskeletal:  Negative for joint pain and myalgias.  Skin:  Positive for rash.  Neurological:  Negative for headaches.     Labs/Other Tests and Data Reviewed:    Recent Labs: 11/06/2023: ALT 28; BUN 12; Creatinine, Ser 0.80; Hemoglobin  15.2; Platelets 304; Potassium 4.6; Sodium 139; TSH 1.640   Recent Lipid Panel Lab Results  Component Value Date/Time   CHOL 233 (H) 07/31/2023 02:19 PM   TRIG 250 (H) 07/31/2023 02:19 PM   HDL 44 07/31/2023 02:19 PM   CHOLHDL 5.3 (H) 07/31/2023 02:19 PM    LDLCALC 144 (H) 07/31/2023 02:19 PM    Wt Readings from Last 3 Encounters:  11/14/23 297 lb (134.7 kg)  11/06/23 297 lb (134.7 kg)  10/04/23 289 lb (131.1 kg)     Objective:    Vital Signs:  There were no vitals taken for this visit.   Physical Exam   ASSESSMENT & PLAN:   There are no diagnoses linked to this encounter.   No orders of the defined types were placed in this encounter.    No orders of the defined types were placed in this encounter.   I spent < time > minutes dedicated to the care of this patient on the date of this encounter to include face-to-face time with the patient.  Follow Up:  {F/U [phone]} {follow up:15908}   [facility]

## 2023-11-27 ENCOUNTER — Encounter: Payer: Self-pay | Admitting: Physician Assistant

## 2023-11-28 ENCOUNTER — Encounter: Payer: Self-pay | Admitting: Physician Assistant

## 2023-11-28 ENCOUNTER — Other Ambulatory Visit: Payer: Self-pay | Admitting: Physician Assistant

## 2023-11-28 DIAGNOSIS — I10 Essential (primary) hypertension: Secondary | ICD-10-CM

## 2023-11-28 DIAGNOSIS — L500 Allergic urticaria: Secondary | ICD-10-CM

## 2023-11-28 MED ORDER — DILTIAZEM HCL ER COATED BEADS 120 MG PO CP24: 120.0000 mg | ORAL_CAPSULE | Freq: Every day | ORAL | 1 refills | Status: DC

## 2023-11-28 NOTE — Assessment & Plan Note (Signed)
 Left sided facial tenderness and drainage. -Continue monitoring symptoms.

## 2023-11-28 NOTE — Assessment & Plan Note (Signed)
 Full body rash with itching, likely allergic reaction. Recent exposure to peach flavored water. Recent completion of clindamycin . No respiratory symptoms. -Administer Kenalog  80 injection today. -Send prednisone  taper to pharmacy. -Provide samples of Zyrtec and Pepcid . -Instruct to take Benadryl  nightly until symptom resolution. -Consider referral to allergist for further evaluation. -Avoid peach flavored water and monitor for any other potential allergens.

## 2023-11-29 ENCOUNTER — Other Ambulatory Visit: Payer: 59

## 2023-11-29 ENCOUNTER — Other Ambulatory Visit: Payer: Self-pay | Admitting: Physician Assistant

## 2023-11-29 DIAGNOSIS — L500 Allergic urticaria: Secondary | ICD-10-CM

## 2023-11-29 DIAGNOSIS — E1165 Type 2 diabetes mellitus with hyperglycemia: Secondary | ICD-10-CM

## 2023-11-29 DIAGNOSIS — E782 Mixed hyperlipidemia: Secondary | ICD-10-CM

## 2023-11-29 LAB — HEMOGLOBIN A1C
Est. average glucose Bld gHb Est-mCnc: 157 mg/dL
Hgb A1c MFr Bld: 7.1 % — ABNORMAL HIGH (ref 4.8–5.6)

## 2023-11-29 LAB — LIPID PANEL
Chol/HDL Ratio: 4.5 {ratio} — ABNORMAL HIGH (ref 0.0–4.4)
Cholesterol, Total: 235 mg/dL — ABNORMAL HIGH (ref 100–199)
HDL: 52 mg/dL (ref >39)
LDL Chol Calc (NIH): 147 mg/dL — ABNORMAL HIGH (ref 0–99)
Triglycerides: 200 mg/dL — ABNORMAL HIGH (ref 0–149)
VLDL Cholesterol Cal: 36 mg/dL (ref 5–40)

## 2023-11-30 LAB — COMPREHENSIVE METABOLIC PANEL
ALT: 28 [IU]/L (ref 0–32)
AST: 17 [IU]/L (ref 0–40)
Albumin: 3.9 g/dL (ref 3.8–4.9)
Alkaline Phosphatase: 81 [IU]/L (ref 44–121)
BUN/Creatinine Ratio: 17 (ref 9–23)
BUN: 12 mg/dL (ref 6–24)
Bilirubin Total: 0.2 mg/dL (ref 0.0–1.2)
CO2: 25 mmol/L (ref 20–29)
Calcium: 9 mg/dL (ref 8.7–10.2)
Chloride: 102 mmol/L (ref 96–106)
Creatinine, Ser: 0.72 mg/dL (ref 0.57–1.00)
Globulin, Total: 2.3 g/dL (ref 1.5–4.5)
Glucose: 126 mg/dL — ABNORMAL HIGH (ref 70–99)
Potassium: 4.3 mmol/L (ref 3.5–5.2)
Sodium: 142 mmol/L (ref 134–144)
Total Protein: 6.2 g/dL (ref 6.0–8.5)
eGFR: 97 mL/min/{1.73_m2} (ref >59)

## 2023-11-30 LAB — CBC WITH DIFFERENTIAL/PLATELET
Basophils Absolute: 0 10*3/uL (ref 0.0–0.2)
Basos: 0 %
EOS (ABSOLUTE): 0.1 10*3/uL (ref 0.0–0.4)
Eos: 1 %
Hematocrit: 42.9 % (ref 34.0–46.6)
Hemoglobin: 14.5 g/dL (ref 11.1–15.9)
Immature Grans (Abs): 0 10*3/uL (ref 0.0–0.1)
Immature Granulocytes: 0 %
Lymphocytes Absolute: 3 10*3/uL (ref 0.7–3.1)
Lymphs: 31 %
MCH: 30.6 pg (ref 26.6–33.0)
MCHC: 33.8 g/dL (ref 31.5–35.7)
MCV: 91 fL (ref 79–97)
Monocytes Absolute: 0.5 10*3/uL (ref 0.1–0.9)
Monocytes: 5 %
Neutrophils Absolute: 5.9 10*3/uL (ref 1.4–7.0)
Neutrophils: 63 %
Platelets: 252 10*3/uL (ref 150–450)
RBC: 4.74 x10E6/uL (ref 3.77–5.28)
RDW: 12.4 % (ref 11.7–15.4)
WBC: 9.5 10*3/uL (ref 3.4–10.8)

## 2023-11-30 LAB — LYME DISEASE SEROLOGY W/REFLEX: Lyme Total Antibody EIA: NEGATIVE

## 2023-11-30 LAB — SPOTTED FEVER GROUP ANTIBODIES
Spotted Fever Group IgG: <1:64 {titer}
Spotted Fever Group IgM: <1:64 {titer}

## 2023-12-04 ENCOUNTER — Encounter: Payer: Self-pay | Admitting: Physician Assistant

## 2023-12-04 ENCOUNTER — Ambulatory Visit: Payer: 59 | Admitting: Physician Assistant

## 2023-12-04 ENCOUNTER — Other Ambulatory Visit: Payer: Self-pay | Admitting: Family Medicine

## 2023-12-04 VITALS — BP 138/76 | HR 78 | Temp 97.6°F | Resp 16 | Ht 70.5 in | Wt 292.2 lb

## 2023-12-04 DIAGNOSIS — E1165 Type 2 diabetes mellitus with hyperglycemia: Secondary | ICD-10-CM | POA: Diagnosis not present

## 2023-12-04 DIAGNOSIS — E782 Mixed hyperlipidemia: Secondary | ICD-10-CM | POA: Diagnosis not present

## 2023-12-04 DIAGNOSIS — T50905A Adverse effect of unspecified drugs, medicaments and biological substances, initial encounter: Secondary | ICD-10-CM

## 2023-12-04 DIAGNOSIS — E1142 Type 2 diabetes mellitus with diabetic polyneuropathy: Secondary | ICD-10-CM

## 2023-12-04 DIAGNOSIS — J320 Chronic maxillary sinusitis: Secondary | ICD-10-CM

## 2023-12-04 DIAGNOSIS — L500 Allergic urticaria: Secondary | ICD-10-CM

## 2023-12-04 MED ORDER — REPATHA SURECLICK 140 MG/ML ~~LOC~~ SOAJ: 140.0000 mg | SUBCUTANEOUS | 2 refills | Status: DC

## 2023-12-04 NOTE — Progress Notes (Signed)
Subjective:  Patient ID: Holly [name], female    DOB: [birth year]  Age: 58 y.o. MRN: [id number]  Chief Complaint  Patient presents with   Medical Management of Chronic Issues    HPI   Discussed the use of AI scribe software for clinical note transcription with the patient, who gave verbal consent to proceed.  History of Present Illness   The patient, with a history of high cholesterol, diabetes, and a recent rash, reports that the rash has resolved since discontinuing irbesartan. The patient believes the rash was an allergic reaction to the medication. The patient also reports persistent sinus issues despite multiple rounds of antibiotics and steroids. The patient is considering seeing an ENT specialist for further evaluation. The patient's blood pressure has been well-controlled on a new medication. The patient is also concerned about fatty liver disease and is interested in starting a gym routine to improve overall health. The patient has a history of diverticulitis and has had issues with certain medications in the past. The patient's A1c has improved, but cholesterol levels remain high.          12/04/2023    1:43 PM 09/24/2023   10:39 AM 12/08/2022   11:39 AM 11/09/2022    3:01 PM 11/09/2022    2:37 PM  Depression screen PHQ 2/9  Decreased Interest 1 2 3  3   Down, Depressed, Hopeless 1 2 2  3   PHQ - 2 Score 2 4 5  6   Altered sleeping 2 3 1  3   Tired, decreased energy 3 2 2  3   Change in appetite 2 1 2  -- 3  Feeling bad or failure about yourself  0 0 0  0  Trouble concentrating 2 2 2  2   Moving slowly or fidgety/restless 0 0 2  0  Suicidal thoughts 0 0   0  PHQ-9 Score 11 12 14  17   Difficult doing work/chores Somewhat difficult Very difficult Very difficult  Extremely dIfficult        12/04/2023    1:42 PM  Fall Risk   Falls in the past year? 0  Number falls in past yr: 0  Injury with Fall? 0  Risk for fall due to : No Fall Risks  Follow up Falls evaluation completed     Patient Care Team: Langley Gauss, [place] as PCP - General (Physician Assistant)   Review of Systems  Constitutional:  Positive for chills.  HENT:  Positive for congestion, postnasal drip, sinus pressure, sinus pain, sneezing and sore throat.   Respiratory:  Positive for cough and chest tightness.   Cardiovascular: Negative.   Gastrointestinal: Negative.   Endocrine: Negative.   Genitourinary: Negative.   Musculoskeletal: Negative.   Skin: Negative.   Allergic/Immunologic: Negative.   Neurological:  Positive for light-headedness and headaches.  Hematological: Negative.   Psychiatric/Behavioral: Negative.      Current Outpatient Medications on File Prior to Visit  Medication Sig Dispense Refill   albuterol (VENTOLIN HFA) 108 (90 Base) MCG/ACT inhaler TAKE 2 PUFFS BY MOUTH EVERY 6 HOURS AS NEEDED FOR WHEEZE OR SHORTNESS OF BREATH 18 each 2   budesonide-formoterol (SYMBICORT) 160-4.5 MCG/ACT inhaler Inhale 2 puffs into the lungs 2 (two) times daily. 1 each 3   busPIRone (BUSPAR) 7.5 MG tablet TAKE 1 TABLET BY MOUTH TWICE A DAY 180 tablet 1   clonazePAM (KLONOPIN) 0.5 MG tablet Take 1 tablet (0.5 mg total) by mouth 2 (two) times daily as needed for anxiety. 10 tablet 1  cyclobenzaprine (FLEXERIL) 5 MG tablet Take 1 tablet (5 mg total) by mouth 3 (three) times daily as needed for muscle spasms. 60 tablet 1   diltiazem (CARDIZEM CD) 120 MG 24 hr capsule Take 1 capsule (120 mg total) by mouth daily. 30 capsule 1   famotidine (PEPCID) 40 MG tablet TAKE 1 TABLET BY MOUTH TWICE A DAY 180 tablet 1   FARXIGA 5 MG TABS tablet TAKE 1 TABLET BY MOUTH EVERY DAY BEFORE BREAKFAST 90 tablet 1   fluconazole (DIFLUCAN) 150 MG tablet Take 1 tablet (150 mg total) by mouth daily. 1 tablet 1   fluticasone (FLONASE) 50 MCG/ACT nasal spray Place 2 sprays into both nostrils daily. 16 g 6   gabapentin (NEURONTIN) 300 MG capsule TAKE 2 CAPSULES BY MOUTH 2 TIMES DAILY. 360 capsule 0   levothyroxine (SYNTHROID) 25  MCG tablet TAKE 1 TABLET BY MOUTH EVERY DAY BEFORE BREAKFAST 90 tablet 0   mometasone (NASONEX) 50 MCG/ACT nasal spray Place 2 sprays into the nose daily. 17 g 12   MOUNJARO 5 MG/0.5ML Pen Inject 5 mg into the skin once a week.     Multiple Vitamin (MULTI-VITAMIN) tablet Take 1 tablet by mouth daily.     omeprazole (PRILOSEC) 40 MG capsule 1 (ONE) CAPSULE EVERY MORNING 30 MINUTES BEFORE BREAKFAST 90 capsule 1   SODIUM FLUORIDE 5000 PPM 1.1 % PSTE See admin instructions.     venlafaxine XR (EFFEXOR-XR) 75 MG 24 hr capsule TAKE 3 CAPSULES BY MOUTH DAILY IN THE MORNING 270 capsule 1   No current facility-administered medications on file prior to visit.   Past Medical History:  Diagnosis Date   Acute pain of left shoulder 09/12/2023   Allergic rhinitis 05/26/2019   Anxiety    At risk for obstructive sleep apnea 09/28/2020   BMI 40.0-44.9, adult (HCC) 08/02/2020   Cellulitis of foot, right 09/24/2023   COVID-19 virus detected 12/30/2019   10/30/2019-SARS-CoV-2-positive Didn't require hospitalization     Depression    Diabetes mellitus without complication (HCC)    Diabetic glomerulopathy (HCC)    Diabetic polyneuropathy (HCC) 08/02/2020   Fatty liver    Fracture of intermediate cuneiform bone of right foot 09/16/2023   Generalized abdominal pain 01/09/2023   Goiter    Hashimoto's thyroiditis    Hilar adenopathy 03/20/19  04/11/19   CTA CHEST and PET per DR. Anne Ng [facility]   History of kidney stones    Hospital discharge follow-up 09/24/2023   Hypertension    Hypothyroidism (acquired) 08/02/2020   Insomnia 11/12/2022   Mediastinal adenopathy    PER CTA CHEST PET SCAN per DR. 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 Sudie Bailey [facility]   Type 2 diabetes mellitus with hyperglycemia, without long-term current use of insulin (HCC) 08/10/2023   Vaginal yeast infection 07/31/2023   Vitamin D deficiency 12/09/2022   Past Surgical History:  Procedure Laterality Date   ABDOMINAL HYSTERECTOMY     COMPLETE   ADHESIOLYSIS     APPENDECTOMY     CESAREAN SECTION     MEDIASTINOSCOPY  N/A 04/18/2019   Procedure: MEDIASTINOSCOPY;  Surgeon: Delight Ovens, MD;  Location[facility] OR;  Service: Thoracic;  Laterality: N/A;   VIDEO BRONCHOSCOPY WITH ENDOBRONCHIAL ULTRASOUND N/A 04/18/2019   Procedure: VIDEO BRONCHOSCOPY WITH ENDOBRONCHIAL ULTRASOUND;  Surgeon: Delight Ovens, MD;  Location: [facility] OR;  Service: Thoracic;  Laterality: N/A;    Family History  Problem Relation Age of Onset   Breast cancer Mother    Anuerysm Mother        BRAIN   Cancer Mother        BREAST   COPD Father    Hyperlipidemia Father    Congestive Heart Failure Father    Bipolar disorder Sister    Diabetes Sister    Fibromyalgia Sister    Cancer Paternal Uncle        LUNG   Breast cancer Maternal Grandmother    Cancer Maternal Grandmother 40       BREAST   Cancer Paternal Grandfather        LUNG   Social History   Socioeconomic History   Marital status: Divorced    Spouse name: Not on file   Number of children: Not on file   Years of education: Not on file   Highest education level: Master's degree (e.g., MA, MS, MEng, MEd, MSW, MBA)  Occupational History   Not on file  Tobacco Use   Smoking status: Never   Smokeless tobacco: Never  Vaping Use   Vaping status: Never Used  Substance and Sexual Activity   Alcohol use: Yes    Comment: VERY RARE OCCASION   Drug use: Never   Sexual activity: Not Currently  Other Topics Concern   Not on file  Social History  Narrative   Not on file   Social Drivers of Health   Financial Resource Strain: Medium Risk (09/30/2023)   Overall Financial Resource Strain (CARDIA)    Difficulty of Paying Living Expenses: Somewhat hard  Food Insecurity: Food Insecurity Present (09/30/2023)   Hunger Vital Sign    Worried About Running Out of Food in the Last Year: Sometimes true    Ran Out of Food in the Last Year: Never true  Transportation Needs: No Transportation Needs (09/30/2023)   PRAPARE - Administrator, Civil Service (Medical): No    Lack of Transportation (Non-Medical): No  Physical Activity: Insufficiently Active (09/30/2023)   Exercise Vital Sign    Days of Exercise per Week: 2 days    Minutes of Exercise per Session: 20 min  Stress: Stress Concern Present (09/30/2023)   [employer] of Occupational Health - Occupational Stress Questionnaire    Feeling of Stress : To some extent  Social Connections: Moderately Integrated (09/30/2023)   Social Connection and Isolation Panel [NHANES]    Frequency of Communication with Friends and Family: More than three times a week    Frequency of Social Gatherings with Friends and Family: Once a week    Attends Religious Services: More than 4 times per year    Active Member of [employer] or Organizations: Yes    Attends Banker Meetings: 1 to 4 times per year    Marital Status: Divorced    Objective:  BP 138/76 (BP Location: Right Arm, Patient Position: Sitting, Cuff Size: Large)   Pulse 78   Temp 97.6 F (36.4 C) (Temporal)   Resp 16   Ht 5' 10.5" (1.791 m)   Wt 292 lb 3.2 oz (132.5 kg)   SpO2 94%  BMI 41.33 kg/m      12/04/2023    1:37 PM 11/26/2023    2:45 PM 11/14/2023    8:43 AM  BP/Weight  Systolic BP 138 124   Diastolic BP 76 74   Wt. (Lbs) 292.2 294 297  BMI 41.33 kg/m2 41.59 kg/m2 42.01 kg/m2    Physical Exam Vitals reviewed.  Constitutional:      Appearance: Normal appearance.  Neck:     Vascular: No carotid bruit.   Cardiovascular:     Rate and Rhythm: Normal rate and regular rhythm.     Heart sounds: Normal heart sounds.  Pulmonary:     Effort: Pulmonary effort is normal.     Breath sounds: Normal breath sounds.  Abdominal:     General: Bowel sounds are normal.     Palpations: Abdomen is soft.     Tenderness: There is no abdominal tenderness.  Neurological:     Mental Status: She is alert and oriented to person, place, and time.  Psychiatric:        Mood and Affect: Mood normal.        Behavior: Behavior normal.     Diabetic Foot Exam - Simple   No data filed      Lab Results  Component Value Date   WBC 9.5 11/29/2023   HGB 14.5 11/29/2023   HCT 42.9 11/29/2023   PLT 252 11/29/2023   GLUCOSE 126 (H) 11/29/2023   CHOL 235 (H) 11/29/2023   TRIG 200 (H) 11/29/2023   HDL 52 11/29/2023   LDLCALC 147 (H) 11/29/2023   ALT 28 11/29/2023   AST 17 11/29/2023   NA 142 11/29/2023   K 4.3 11/29/2023   CL 102 11/29/2023   CREATININE 0.72 11/29/2023   BUN 12 11/29/2023   CO2 25 11/29/2023   TSH 1.640 11/06/2023   INR 1.0 04/16/2019   HGBA1C 7.1 (H) 11/29/2023   MICROALBUR 80 08/15/2020      Assessment & Plan:    Mixed hyperlipidemia Assessment & Plan: Elevated cholesterol despite lifestyle modifications and previous trials of statins. Discussed potential for Repatha (PCSK9 inhibitor) therapy. -Initiate prior authorization process for Repatha. -Consider re-trial of statin therapy if necessary.  Orders: -     Repatha SureClick; Inject 140 mg into the skin every 14 (fourteen) days.  Dispense: 2 mL; Refill: 2  Chronic sinusitis of both maxillary sinuses Assessment & Plan: Persistent sinus pain despite multiple rounds of antibiotics. -Refer to ENT for further evaluation and potential imaging.  Orders: -     Ambulatory referral to ENT  Type 2 diabetes mellitus with hyperglycemia, without long-term current use of insulin (HCC) Assessment & Plan: Improved A1c on current regimen  of Metformin and Farxiga. -Continue current regimen. -Consider increasing dose of Mounjaro if no side effects and A1c remains above target.   Urticaria due to drug allergy Assessment & Plan: Rash improved after discontinuation of Irbesartan. Discussed potential for allergy testing to confirm drug allergy. -Refer to allergist for further evaluation and testing.      Meds ordered this encounter  Medications   Evolocumab (REPATHA SURECLICK) 140 MG/ML SOAJ    Sig: Inject 140 mg into the skin every 14 (fourteen) days.    Dispense:  2 mL    Refill:  2    Orders Placed This Encounter  Procedures   Ambulatory referral to ENT    General Health Maintenance -Continue exercise regimen at [facility]. -Follow-up in June 2025 with repeat labs.  Follow-up: Return in about 4 months (around 04/02/2024) for Chronic, Huston Foley.   I,Angela Taylor,acting as a Neurosurgeon for [employer], PA.,have documented all relevant documentation on the behalf of Langley Gauss, PA,as directed by  Langley Gauss, PA while in the presence of Langley Gauss, [place].   An After Visit Summary was printed and given to the patient.  Langley Gauss, [place] [facility] [phone]

## 2023-12-06 ENCOUNTER — Other Ambulatory Visit: Payer: Self-pay | Admitting: Family Medicine

## 2023-12-06 ENCOUNTER — Other Ambulatory Visit: Payer: Self-pay | Admitting: Physician Assistant

## 2023-12-06 DIAGNOSIS — F331 Major depressive disorder, recurrent, moderate: Secondary | ICD-10-CM

## 2023-12-08 DIAGNOSIS — J320 Chronic maxillary sinusitis: Secondary | ICD-10-CM | POA: Insufficient documentation

## 2023-12-08 HISTORY — DX: Chronic maxillary sinusitis: J32.0

## 2023-12-08 NOTE — Assessment & Plan Note (Signed)
Persistent sinus pain despite multiple rounds of antibiotics. -Refer to ENT for further evaluation and potential imaging.

## 2023-12-08 NOTE — Assessment & Plan Note (Signed)
Rash improved after discontinuation of Irbesartan. Discussed potential for allergy testing to confirm drug allergy. -Refer to allergist for further evaluation and testing.

## 2023-12-08 NOTE — Assessment & Plan Note (Signed)
Elevated cholesterol despite lifestyle modifications and previous trials of statins. Discussed potential for Repatha (PCSK9 inhibitor) therapy. -Initiate prior authorization process for Repatha. -Consider re-trial of statin therapy if necessary.

## 2023-12-08 NOTE — Assessment & Plan Note (Signed)
Improved A1c on current regimen of Metformin and Farxiga. -Continue current regimen. -Consider increasing dose of Mounjaro if no side effects and A1c remains above target.

## 2023-12-19 ENCOUNTER — Ambulatory Visit: Payer: Self-pay | Admitting: Allergy

## 2023-12-25 ENCOUNTER — Encounter: Payer: Self-pay | Admitting: Physician Assistant

## 2023-12-27 NOTE — Progress Notes (Deleted)
 New Patient Note  RE: Holly [name] MRN: [id number] DOB: August 15, 1966 Date of Office Visit: 12/28/2023  Consult requested by: Langley Gauss, PA Primary care provider: Langley Gauss, PA  Chief Complaint: No chief complaint on file.  History of Present Illness: I had the pleasure of seeing Holly [name] for initial evaluation at the [facility] on 12/27/2023. She is a 58 y.o. female, who is referred here by Langley Gauss, PA for the evaluation of hives.  Discussed the use of AI scribe software for clinical note transcription with the patient, who gave verbal consent to proceed.  History of Present Illness             Referral note: "New onset urticarial rash from recent peach drink ingestion. Continues to also have elevated neutrophils. "  11/26/2023 PCP visit: "Full body rash with itching, likely allergic reaction. Recent exposure to peach flavored water. Recent completion of clindamycin. No respiratory symptoms. -Administer Kenalog 80 injection today. -Send prednisone taper to pharmacy. -Provide samples of Zyrtec and Pepcid. -Instruct to take Benadryl nightly until symptom resolution. -Consider referral to allergist for further evaluation. -Avoid peach flavored water and monitor for any other potential allergens."  Assessment and Plan: Holly [name] is a 58 y.o. female with: ***  Assessment and Plan               No follow-ups on file.  No orders of the defined types were placed in this encounter.  Lab Orders  No laboratory test(s) ordered today    Other allergy screening: Asthma: {Blank single:19197::"yes","no"} Rhino conjunctivitis: {Blank single:19197::"yes","no"} Food allergy: {Blank single:19197::"yes","no"} Medication allergy: {Blank single:19197::"yes","no"} Hymenoptera allergy: {Blank single:19197::"yes","no"} Urticaria: {Blank single:19197::"yes","no"} Eczema:{Blank single:19197::"yes","no"} History of recurrent infections suggestive of  immunodeficency: {Blank single:19197::"yes","no"}  Diagnostics: Spirometry:  Tracings reviewed. Her effort: {Blank single:19197::"Good reproducible efforts.","It was hard to get consistent efforts and there is a question as to whether this reflects a maximal maneuver.","Poor effort, data can not be interpreted."} FVC: ***L FEV1: ***L, ***% predicted FEV1/FVC ratio: ***% Interpretation: {Blank single:19197::"Spirometry consistent with mild obstructive disease","Spirometry consistent with moderate obstructive disease","Spirometry consistent with severe obstructive disease","Spirometry consistent with possible restrictive disease","Spirometry consistent with mixed obstructive and restrictive disease","Spirometry uninterpretable due to technique","Spirometry consistent with normal pattern","No overt abnormalities noted given today's efforts"}.  Please see scanned spirometry results for details.  Skin Testing: {Blank single:19197::"Select foods","Environmental allergy panel","Environmental allergy panel and select foods","Food allergy panel","None","Deferred due to recent antihistamines use"}. *** Results discussed with patient/family.   Past Medical History: Patient Active Problem List   Diagnosis Date Noted  . Chronic sinusitis of both maxillary sinuses 12/08/2023  . Urticaria due to drug allergy 11/26/2023  . Depression   . Diabetes mellitus without complication (HCC)   . Fatty liver   . Goiter   . History of kidney stones   . Cellulitis of foot, right 09/24/2023  . Hospital discharge follow-up 09/24/2023  . Fracture of intermediate cuneiform bone of right foot 09/16/2023  . Acute pain of left shoulder 09/12/2023  . Type 2 diabetes mellitus with hyperglycemia, without long-term current use of insulin (HCC) 08/10/2023  . Vaginal yeast infection 07/31/2023  . Generalized abdominal pain 01/09/2023  . Pharyngoesophageal dysphagia 01/09/2023  . Vitamin D deficiency 12/09/2022  . Insomnia  11/12/2022  . Strain of lumbar region 09/10/2022  . OSA (obstructive sleep apnea) 11/23/2020  . At risk for obstructive sleep apnea 09/28/2020  . Hypothyroidism (acquired) 08/02/2020  . Diabetic polyneuropathy (HCC) 08/02/2020  . Mixed hyperlipidemia 08/02/2020  .  Morbid obesity (HCC) 08/02/2020  . BMI 40.0-44.9, adult (HCC) 08/02/2020  . Acute non-recurrent maxillary sinusitis 06/17/2020  . Allergic rhinitis 05/26/2019  . Diabetic glomerulopathy (HCC)   . Severe recurrent major depression without psychotic features (HCC)   . Anxiety   . Hypertension   . Hashimoto's thyroiditis   . Hilar adenopathy   . Supraclavicular adenopathy 03/20/2019  . Sarcoidosis 03/01/2019  . Moderate recurrent major depression (HCC) 07/09/2017   Past Medical History:  Diagnosis Date  . Acute pain of left shoulder 09/12/2023  . Allergic rhinitis 05/26/2019  . Anxiety   . At risk for obstructive sleep apnea 09/28/2020  . BMI 40.0-44.9, adult (HCC) 08/02/2020  . Cellulitis of foot, right 09/24/2023  . COVID-19 virus detected 12/30/2019   10/30/2019-SARS-CoV-2-positive Didn't require hospitalization    . Depression   . Diabetes mellitus without complication (HCC)   . Diabetic glomerulopathy (HCC)   . Diabetic polyneuropathy (HCC) 08/02/2020  . Fatty liver   . Fracture of intermediate cuneiform bone of right foot 09/16/2023  . Generalized abdominal pain 01/09/2023  . Goiter   . Hashimoto's thyroiditis   . Hilar adenopathy 03/20/19  04/11/19   CTA CHEST and PET per DR. Anne Ng LEWIS  . History of kidney stones   . Hospital discharge follow-up 09/24/2023  . Hypertension   . Hypothyroidism (acquired) 08/02/2020  . Insomnia 11/12/2022  . Mediastinal adenopathy    PER CTA CHEST PET SCAN per DR. Lanae Crumbly LEWIS  . Mixed hyperlipidemia 08/02/2020  . Moderate recurrent major depression (HCC) 07/09/2017  . Morbid obesity (HCC) 08/02/2020  . OSA (obstructive sleep apnea) 11/23/2020  . Pharyngoesophageal  dysphagia 01/09/2023  . Pneumonia   . Sarcoidosis 03/01/2019   PER PET SCAN per DR. Heath Gold LEWIS    . Severe recurrent major depression without psychotic features (HCC)   . Strain of lumbar region 09/10/2022  . Supraclavicular adenopathy 03/20/2019   PER CTA CHEST and PET per DR. Sudie Bailey LEWIS  . Type 2 diabetes mellitus with hyperglycemia, without long-term current use of insulin (HCC) 08/10/2023  . Vaginal yeast infection 07/31/2023  . Vitamin D deficiency 12/09/2022   Past Surgical History: Past Surgical History:  Procedure Laterality Date  . ABDOMINAL HYSTERECTOMY     COMPLETE  . ADHESIOLYSIS    . APPENDECTOMY    . CESAREAN SECTION    . MEDIASTINOSCOPY N/A 04/18/2019   Procedure: MEDIASTINOSCOPY;  Surgeon: Delight Ovens, MD;  [facility];  Service: Thoracic;  Laterality: N/A;  . VIDEO BRONCHOSCOPY WITH ENDOBRONCHIAL ULTRASOUND N/A 04/18/2019   Procedure: VIDEO BRONCHOSCOPY WITH ENDOBRONCHIAL ULTRASOUND;  Surgeon: Delight Ovens, MD;  Location: [facility] OR;  Service: Thoracic;  Laterality: N/A;   Medication List:  Current Outpatient Medications  Medication Sig Dispense Refill  . albuterol (VENTOLIN HFA) 108 (90 Base) MCG/ACT inhaler TAKE 2 PUFFS BY MOUTH EVERY 6 HOURS AS NEEDED FOR WHEEZE OR SHORTNESS OF BREATH 18 each 2  . budesonide-formoterol (SYMBICORT) 160-4.5 MCG/ACT inhaler Inhale 2 puffs into the lungs 2 (two) times daily. 1 each 3  . buPROPion (WELLBUTRIN XL) 150 MG 24 hr tablet TAKE 1 TABLET BY MOUTH EVERY DAY 90 tablet 0  . busPIRone (BUSPAR) 7.5 MG tablet TAKE 1 TABLET BY MOUTH TWICE A DAY 180 tablet 1  . clonazePAM (KLONOPIN) 0.5 MG tablet Take 1 tablet (0.5 mg total) by mouth 2 (two) times daily as needed for anxiety. 10 tablet 1  . cyclobenzaprine (FLEXERIL) 5 MG tablet Take 1 tablet (5 mg total) by mouth  3 (three) times daily as needed for muscle spasms. 60 tablet 1  . diltiazem (CARDIZEM CD) 120 MG 24 hr capsule Take 1 capsule (120 mg total) by mouth  daily. 30 capsule 1  . Evolocumab (REPATHA SURECLICK) 140 MG/ML SOAJ Inject 140 mg into the skin every 14 (fourteen) days. 2 mL 2  . famotidine (PEPCID) 40 MG tablet TAKE 1 TABLET BY MOUTH TWICE A DAY 180 tablet 1  . FARXIGA 5 MG TABS tablet TAKE 1 TABLET BY MOUTH EVERY DAY BEFORE BREAKFAST 90 tablet 1  . fluconazole (DIFLUCAN) 150 MG tablet Take 1 tablet (150 mg total) by mouth daily. 1 tablet 1  . fluticasone (FLONASE) 50 MCG/ACT nasal spray Place 2 sprays into both nostrils daily. 16 g 6  . gabapentin (NEURONTIN) 300 MG capsule TAKE 2 CAPSULES BY MOUTH 2 TIMES DAILY. 360 capsule 0  . levothyroxine (SYNTHROID) 25 MCG tablet TAKE 1 TABLET BY MOUTH EVERY DAY BEFORE BREAKFAST 90 tablet 0  . metFORMIN (GLUCOPHAGE) 1000 MG tablet TAKE 1 TABLET (1,000 MG TOTAL) BY MOUTH TWICE A DAY WITH FOOD 180 tablet 2  . mometasone (NASONEX) 50 MCG/ACT nasal spray Place 2 sprays into the nose daily. 17 g 12  . MOUNJARO 5 MG/0.5ML Pen Inject 5 mg into the skin once a week.    . Multiple Vitamin (MULTI-VITAMIN) tablet Take 1 tablet by mouth daily.    Marland Kitchen omeprazole (PRILOSEC) 40 MG capsule 1 (ONE) CAPSULE EVERY MORNING 30 MINUTES BEFORE BREAKFAST 90 capsule 1  . SODIUM FLUORIDE 5000 PPM 1.1 % PSTE See admin instructions.    . traZODone (DESYREL) 150 MG tablet TAKE 1 TABLET BY MOUTH EVERYDAY AT BEDTIME 90 tablet 0  . venlafaxine XR (EFFEXOR-XR) 75 MG 24 hr capsule TAKE 3 CAPSULES BY MOUTH DAILY IN THE MORNING 270 capsule 1   No current facility-administered medications for this visit.   Allergies: Allergies  Allergen Reactions  . Irbesartan Hives  . Moxifloxacin Anaphylaxis, Other (See Comments) and Hives  . Avelox [Moxifloxacin Hcl In Nacl]   . Cymbalta [Duloxetine Hcl]   . Duloxetine Hives  . Oxycodone-Acetaminophen Hives   Social History: Social History   Socioeconomic History  . Marital status: Divorced    Spouse name: Not on file  . Number of children: Not on file  . Years of education: Not on file   . Highest education level: Master's degree (e.g., MA, MS, MEng, MEd, MSW, MBA)  Occupational History  . Not on file  Tobacco Use  . Smoking status: Never  . Smokeless tobacco: Never  Vaping Use  . Vaping status: Never Used  Substance and Sexual Activity  . Alcohol use: Yes    Comment: VERY RARE OCCASION  . Drug use: Never  . Sexual activity: Not Currently  Other Topics Concern  . Not on file  Social History Narrative  . Not on file   Social Drivers of Health   Financial Resource Strain: Medium Risk (09/30/2023)   Overall Financial Resource Strain (CARDIA)   . Difficulty of Paying Living Expenses: Somewhat hard  Food Insecurity: Food Insecurity Present (09/30/2023)   Hunger Vital Sign   . Worried About Programme researcher, broadcasting/film/video in the Last Year: Sometimes true   . Ran Out of Food in the Last Year: Never true  Transportation Needs: No Transportation Needs (09/30/2023)   PRAPARE - Transportation   . Lack of Transportation (Medical): No   . Lack of Transportation (Non-Medical): No  Physical Activity: Insufficiently Active (09/30/2023)  Exercise Vital Sign   . Days of Exercise per Week: 2 days   . Minutes of Exercise per Session: 20 min  Stress: Stress Concern Present (09/30/2023)   [employer] of Occupational Health - Occupational Stress Questionnaire   . Feeling of Stress : To some extent  Social Connections: Moderately Integrated (09/30/2023)   Social Connection and Isolation Panel [NHANES]   . Frequency of Communication with Friends and Family: More than three times a week   . Frequency of Social Gatherings with Friends and Family: Once a week   . Attends Religious Services: More than 4 times per year   . Active Member of Clubs or Organizations: Yes   . Attends Banker Meetings: 1 to 4 times per year   . Marital Status: Divorced   Lives in a ***. Smoking: *** Occupation: ***  Environmental HistorySurveyor, minerals in the house: Secretary/administrator in the family room: {Blank single:19197::"yes","no"} Carpet in the bedroom: {Blank single:19197::"yes","no"} Heating: {Blank single:19197::"electric","gas","heat pump"} Cooling: {Blank single:19197::"central","window","heat pump"} Pet: {Blank single:19197::"yes ***","no"}  Family History: Family History  Problem Relation Age of Onset  . Breast cancer Mother   . Anuerysm Mother        BRAIN  . Cancer Mother        BREAST  . COPD Father   . Hyperlipidemia Father   . Congestive Heart Failure Father   . Bipolar disorder Sister   . Diabetes Sister   . Fibromyalgia Sister   . Cancer Paternal Uncle        LUNG  . Breast cancer Maternal Grandmother   . Cancer Maternal Grandmother 46       BREAST  . Cancer Paternal Grandfather        LUNG   Problem                               Relation Asthma                                   *** Eczema                                *** Food allergy                          *** Allergic rhino conjunctivitis     ***  Review of Systems  Constitutional:  Negative for appetite change, chills, fever and unexpected weight change.  HENT:  Negative for congestion and rhinorrhea.   Eyes:  Negative for itching.  Respiratory:  Negative for cough, chest tightness, shortness of breath and wheezing.   Cardiovascular:  Negative for chest pain.  Gastrointestinal:  Negative for abdominal pain.  Genitourinary:  Negative for difficulty urinating.  Skin:  Negative for rash.  Neurological:  Negative for headaches.   Objective: There were no vitals taken for this visit. There is no height or weight on file to calculate BMI. Physical Exam Vitals and nursing note reviewed.  Constitutional:      Appearance: Normal appearance. She is well-developed.  HENT:     Head: Normocephalic and atraumatic.     Right Ear: Tympanic membrane and external ear normal.     Left Ear: Tympanic membrane and external ear normal.  Nose: Nose  normal.     Mouth/Throat:     Mouth: Mucous membranes are moist.     Pharynx: Oropharynx is clear.  Eyes:     Conjunctiva/sclera: Conjunctivae normal.  Cardiovascular:     Rate and Rhythm: Normal rate and regular rhythm.     Heart sounds: Normal heart sounds. No murmur heard.    No friction rub. No gallop.  Pulmonary:     Effort: Pulmonary effort is normal.     Breath sounds: Normal breath sounds. No wheezing, rhonchi or rales.  Musculoskeletal:     Cervical back: Neck supple.  Skin:    General: Skin is warm.     Findings: No rash.  Neurological:     Mental Status: She is alert and oriented to person, place, and time.  Psychiatric:        Behavior: Behavior normal.  The plan was reviewed with the patient/family, and all questions/concerned were addressed.  It was my pleasure to see Holly [name] today and participate in her care. Please feel free to contact me with any questions or concerns.  Sincerely,  Wyline Mood, DO Allergy & Immunology  [facility] office: [phone] [facility] office: [phone]

## 2023-12-28 ENCOUNTER — Encounter: Payer: Self-pay | Admitting: Physician Assistant

## 2023-12-28 ENCOUNTER — Ambulatory Visit: Payer: Self-pay | Admitting: Allergy

## 2023-12-29 ENCOUNTER — Other Ambulatory Visit: Payer: Self-pay | Admitting: Family Medicine

## 2023-12-29 DIAGNOSIS — I10 Essential (primary) hypertension: Secondary | ICD-10-CM

## 2023-12-31 ENCOUNTER — Other Ambulatory Visit: Payer: Self-pay | Admitting: Physician Assistant

## 2023-12-31 DIAGNOSIS — E1121 Type 2 diabetes mellitus with diabetic nephropathy: Secondary | ICD-10-CM

## 2023-12-31 MED ORDER — TIRZEPATIDE 7.5 MG/0.5ML ~~LOC~~ SOAJ: 7.5000 mg | SUBCUTANEOUS | 2 refills | Status: DC

## 2024-01-03 ENCOUNTER — Other Ambulatory Visit: Payer: Self-pay | Admitting: Physician Assistant

## 2024-01-03 ENCOUNTER — Encounter: Payer: Self-pay | Admitting: Physician Assistant

## 2024-01-03 DIAGNOSIS — E1165 Type 2 diabetes mellitus with hyperglycemia: Secondary | ICD-10-CM

## 2024-01-03 MED ORDER — TIRZEPATIDE 5 MG/0.5ML ~~LOC~~ SOAJ: 5.0000 mg | SUBCUTANEOUS | 0 refills | Status: DC

## 2024-01-11 ENCOUNTER — Encounter: Payer: Self-pay | Admitting: Physician Assistant

## 2024-01-14 ENCOUNTER — Other Ambulatory Visit: Payer: Self-pay | Admitting: Physician Assistant

## 2024-01-14 DIAGNOSIS — E782 Mixed hyperlipidemia: Secondary | ICD-10-CM

## 2024-01-14 MED ORDER — NEXLIZET 180-10 MG PO TABS: 1.0000 | ORAL_TABLET | Freq: Every day | ORAL | 1 refills | Status: DC

## 2024-01-15 ENCOUNTER — Telehealth: Payer: Self-pay

## 2024-01-15 NOTE — Telephone Encounter (Signed)
 PA submitted and approved for nexlizet via covermymeds.

## 2024-01-17 ENCOUNTER — Other Ambulatory Visit: Payer: Self-pay | Admitting: Family Medicine

## 2024-01-17 DIAGNOSIS — K219 Gastro-esophageal reflux disease without esophagitis: Secondary | ICD-10-CM

## 2024-01-17 DIAGNOSIS — F332 Major depressive disorder, recurrent severe without psychotic features: Secondary | ICD-10-CM

## 2024-01-29 ENCOUNTER — Other Ambulatory Visit: Payer: Self-pay | Admitting: Physician Assistant

## 2024-01-29 DIAGNOSIS — I10 Essential (primary) hypertension: Secondary | ICD-10-CM

## 2024-01-30 ENCOUNTER — Encounter: Payer: Self-pay | Admitting: Physician Assistant

## 2024-01-30 ENCOUNTER — Ambulatory Visit: Admitting: Physician Assistant

## 2024-01-30 VITALS — BP 168/78 | HR 91 | Temp 97.9°F | Ht 70.5 in | Wt 294.0 lb

## 2024-01-30 DIAGNOSIS — R0602 Shortness of breath: Secondary | ICD-10-CM

## 2024-01-30 DIAGNOSIS — I309 Acute pericarditis, unspecified: Secondary | ICD-10-CM

## 2024-01-30 DIAGNOSIS — I319 Disease of pericardium, unspecified: Secondary | ICD-10-CM

## 2024-01-30 DIAGNOSIS — E559 Vitamin D deficiency, unspecified: Secondary | ICD-10-CM | POA: Diagnosis not present

## 2024-01-30 DIAGNOSIS — I10 Essential (primary) hypertension: Secondary | ICD-10-CM

## 2024-01-30 DIAGNOSIS — S91302A Unspecified open wound, left foot, initial encounter: Secondary | ICD-10-CM

## 2024-01-30 DIAGNOSIS — E063 Autoimmune thyroiditis: Secondary | ICD-10-CM | POA: Diagnosis not present

## 2024-01-30 DIAGNOSIS — E119 Type 2 diabetes mellitus without complications: Secondary | ICD-10-CM

## 2024-01-30 HISTORY — DX: Unspecified open wound, left foot, initial encounter: S91.302A

## 2024-01-30 HISTORY — DX: Disease of pericardium, unspecified: I31.9

## 2024-01-30 NOTE — Assessment & Plan Note (Signed)
 Pressure ulcer on foot with numbness. Previous painful ulcer. Monitoring advised. - Document the ulcer with a photograph in the chart.

## 2024-01-30 NOTE — Patient Instructions (Signed)
 VISIT SUMMARY:  During today's visit, we discussed your intermittent chest pain, severe stomach issues, and other ongoing health concerns. We reviewed your current medications and made some adjustments to better manage your symptoms. We also planned several follow-up tests and referrals to ensure comprehensive care.  YOUR PLAN:  -INTERMITTENT CHEST PAIN: Intermittent chest pain can have various causes, including heart issues, inflammation, or reflux. We will perform an echocardiogram to assess your heart and start you on high-dose ibuprofen (800 mg every 8 hours with food) for three weeks. Continue taking omeprazole to protect your stomach, and we will refer you to a cardiologist for further evaluation.  -GASTROESOPHAGEAL REFLUX DISEASE (GERD): GERD is a condition where stomach acid frequently flows back into the tube connecting your mouth and stomach, causing discomfort. Please take omeprazole regularly and avoid foods that trigger reflux, such as full-fat milk.  -HYPERTENSION: Hypertension, or high blood pressure, can be influenced by pain and certain medications. We will recheck your blood pressure and recommend switching from Allegra D to regular Allegra to avoid increasing your blood pressure.  -DIABETES MELLITUS: Diabetes is a condition that affects how your body processes blood sugar. Continue managing your diabetes with Mounjaro and monitor any symptoms you experience.  -FOOT ULCER: A foot ulcer is an open sore that can develop due to pressure and numbness. We will document the ulcer with a photograph and continue to monitor it.  -HASHIMOTO'S THYROIDITIS: Hashimoto's thyroiditis is an autoimmune disorder that affects the thyroid gland. We will order thyroid function tests and check your vitamin D levels to better understand your symptoms.  -GENERAL HEALTH MAINTENANCE: We encourage you to stay hydrated and reduce your intake of soft drinks. You are also due for a foot exam, which we will  perform today.  INSTRUCTIONS:  Please follow up with cardiology for the echocardiogram results. Monitor your blood pressure and adjust treatment as needed. We will reassess your thyroid function and vitamin D levels after testing.

## 2024-01-30 NOTE — Assessment & Plan Note (Signed)
 Diabetes management with Mounjaro ongoing. Improved A1c levels. Monitoring symptoms related to [facility] advised. - Monitor diabetes management and symptoms related to [facility].

## 2024-01-30 NOTE — Assessment & Plan Note (Signed)
 Intermittent chest pain with differential diagnosis including pericarditis, pancreatitis, and reflux. EKG unchanged. Sarcoidosis may contribute. Echocardiogram preferred for heart assessment. - Order echocardiogram to assess heart structure and function. - Start high-dose NSAIDs (ibuprofen 800 mg every 8 hours) for 3 weeks with food. - Continue omeprazole to prevent NSAID-induced gastric erosion. - Refer to cardiology for further evaluation.

## 2024-01-30 NOTE — Assessment & Plan Note (Signed)
 Hashimoto's thyroiditis with voice changes and neck discomfort. Hair thinning reported. Blood tests planned. - Order thyroid function tests. - Check vitamin D levels.

## 2024-01-30 NOTE — Assessment & Plan Note (Signed)
 Elevated blood pressure possibly due to pain and Allegra D. Headaches reported. Recent medication change may not be effective yet. - Recheck blood pressure. - Advise switching from Allegra D to regular Allegra to avoid decongestant effects on blood pressure.

## 2024-01-30 NOTE — Progress Notes (Signed)
 Acute Office Visit  Subjective:    Patient ID: Holly [name], female    DOB: 1966-08-05, 58 y.o.   MRN: [id number]  Chief Complaint  Patient presents with   Chest pain   HPI: Patient is in today for intermittent chest pain  Discussed the use of AI scribe software for clinical note transcription with the patient, who gave verbal consent to proceed.  History of Present Illness   The patient, with a history of Hashimoto's disease, diabetes, and sarcoidosis, presents with intermittent chest pain that seems to worsen with inactivity. The pain is described as a "boring" sensation, located in the chest and radiating through to the back. The patient also reports severe stomach issues, including diarrhea and gas, which have been exacerbated since increasing her dose of Mounjaro to five. The patient has been on this increased dose for four weeks. The patient also reports a history of bad reflux. The patient has not noticed any blood in her stool, but reports the diarrhea is severe. The patient also reports occasional nausea, but no vomiting. The patient has a history of a nodule on her thyroid and reports thinning hair. The patient also reports a scratch on the bottom of her foot, which she has been monitoring.       Past Medical History:  Diagnosis Date   Acute pain of left shoulder 09/12/2023   Allergic rhinitis 05/26/2019   Anxiety    At risk for obstructive sleep apnea 09/28/2020   BMI 40.0-44.9, adult (HCC) 08/02/2020   Cellulitis of foot, right 09/24/2023   COVID-19 virus detected 12/30/2019   10/30/2019-SARS-CoV-2-positive Didn't require hospitalization     Depression    Diabetes mellitus without complication (HCC)    Diabetic glomerulopathy (HCC)    Diabetic polyneuropathy (HCC) 08/02/2020   Fatty liver    Fracture of intermediate cuneiform bone of right foot 09/16/2023   Generalized abdominal pain 01/09/2023   Goiter    Hashimoto's thyroiditis    Hilar adenopathy 03/20/19  04/11/19    CTA CHEST and PET per DR. Anne Ng LEWIS   History of kidney stones    Hospital discharge follow-up 09/24/2023   Hypertension    Hypothyroidism (acquired) 08/02/2020   Insomnia 11/12/2022   Mediastinal adenopathy    PER CTA CHEST PET SCAN per DR. 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 Heath Gold LEWIS     Severe recurrent major depression without psychotic features (HCC)    Strain of lumbar region 09/10/2022   Supraclavicular adenopathy 03/20/2019   PER CTA CHEST and PET per DR. Sudie Bailey LEWIS   Type 2 diabetes mellitus with hyperglycemia, without long-term current use of insulin (HCC) 08/10/2023   Vaginal yeast infection 07/31/2023   Vitamin D deficiency 12/09/2022    Past Surgical History:  Procedure Laterality Date   ABDOMINAL HYSTERECTOMY     COMPLETE   ADHESIOLYSIS     APPENDECTOMY     CESAREAN SECTION     MEDIASTINOSCOPY N/A 04/18/2019   Procedure: MEDIASTINOSCOPY;  Surgeon: Delight Ovens, MD;  Location: [facility] OR;  Service: Thoracic;  Laterality: N/A;   VIDEO BRONCHOSCOPY WITH ENDOBRONCHIAL ULTRASOUND N/A 04/18/2019   Procedure: VIDEO BRONCHOSCOPY WITH ENDOBRONCHIAL ULTRASOUND;  Surgeon: Delight Ovens, MD;  Location: [facility] OR;  Service: Thoracic;  Laterality: N/A;    Family History  Problem Relation Age of Onset   Breast cancer Mother    Anuerysm Mother        BRAIN   Cancer Mother        BREAST   COPD Father    Hyperlipidemia Father    Congestive Heart Failure Father    Bipolar disorder Sister    Diabetes Sister    Fibromyalgia Sister    Cancer Paternal Uncle        LUNG   Breast cancer Maternal Grandmother    Cancer Maternal Grandmother 71       BREAST   Cancer Paternal Grandfather        LUNG    Social  History   Socioeconomic History   Marital status: Divorced    Spouse name: Not on file   Number of children: Not on file   Years of education: Not on file   Highest education level: Master's degree (e.g., MA, MS, MEng, MEd, MSW, MBA)  Occupational History   Not on file  Tobacco Use   Smoking status: Never   Smokeless tobacco: Never  Vaping Use   Vaping status: Never Used  Substance and Sexual Activity   Alcohol use: Yes    Comment: VERY RARE OCCASION   Drug use: Never   Sexual activity: Not Currently  Other Topics Concern   Not on file  Social History Narrative   Not on file   Social Drivers of Health   Financial Resource Strain: Medium Risk (09/30/2023)   Overall Financial Resource Strain (CARDIA)    Difficulty of Paying Living Expenses: Somewhat hard  Food Insecurity: Food Insecurity Present (09/30/2023)   Hunger Vital Sign    Worried About Running Out of Food in the Last Year: Sometimes true    Ran Out of Food in the Last Year: Never true  Transportation Needs: No Transportation Needs (09/30/2023)   PRAPARE - Administrator, Civil Service (Medical): No    Lack of Transportation (Non-Medical): No  Physical Activity: Insufficiently Active (09/30/2023)   Exercise Vital Sign    Days of Exercise per Week: 2 days    Minutes of Exercise per Session: 20 min  Stress: Stress Concern Present (09/30/2023)   [employer] of Occupational Health - Occupational Stress Questionnaire    Feeling of Stress : To some extent  Social Connections: Moderately Integrated (09/30/2023)   Social Connection and Isolation Panel [NHANES]    Frequency of Communication with Friends and Family: More than three times a week    Frequency of Social Gatherings with Friends and Family: Once a week    Attends Religious Services: More than 4 times per year    Active Member of [employer] or Organizations: Yes    Attends Banker Meetings: 1 to 4 times per year    Marital Status: Divorced   Intimate Partner Violence: Not At Risk (12/08/2022)   Humiliation, Afraid, Rape, and Kick questionnaire    Fear of Current or Ex-Partner: No    Emotionally Abused: No    Physically Abused: No    Sexually Abused: No    Outpatient Medications Prior to Visit  Medication Sig Dispense Refill   albuterol (VENTOLIN HFA) 108 (90 Base) MCG/ACT inhaler TAKE 2 PUFFS BY MOUTH EVERY 6 HOURS AS NEEDED FOR WHEEZE OR SHORTNESS OF BREATH 18 each 2   Bempedoic Acid-Ezetimibe (NEXLIZET) 180-10 MG TABS Take 1 tablet by mouth daily. 30 tablet 1   buPROPion (  WELLBUTRIN XL) 150 MG 24 hr tablet TAKE 1 TABLET BY MOUTH EVERY DAY 90 tablet 0   busPIRone (BUSPAR) 7.5 MG tablet TAKE 1 TABLET BY MOUTH TWICE A DAY 180 tablet 1   clonazePAM (KLONOPIN) 0.5 MG tablet Take 1 tablet (0.5 mg total) by mouth 2 (two) times daily as needed for anxiety. 10 tablet 1   cyclobenzaprine (FLEXERIL) 5 MG tablet Take 1 tablet (5 mg total) by mouth 3 (three) times daily as needed for muscle spasms. 60 tablet 1   diltiazem (CARDIZEM CD) 120 MG 24 hr capsule TAKE 1 CAPSULE BY MOUTH EVERY DAY 90 capsule 1   famotidine (PEPCID) 40 MG tablet TAKE 1 TABLET BY MOUTH TWICE A DAY 180 tablet 1   FARXIGA 5 MG TABS tablet TAKE 1 TABLET BY MOUTH EVERY DAY BEFORE BREAKFAST 90 tablet 1   gabapentin (NEURONTIN) 300 MG capsule TAKE 2 CAPSULES BY MOUTH 2 TIMES DAILY. 360 capsule 0   levothyroxine (SYNTHROID) 25 MCG tablet TAKE 1 TABLET BY MOUTH EVERY DAY BEFORE BREAKFAST 90 tablet 0   metFORMIN (GLUCOPHAGE) 1000 MG tablet TAKE 1 TABLET (1,000 MG TOTAL) BY MOUTH TWICE A DAY WITH FOOD 180 tablet 2   mometasone (NASONEX) 50 MCG/ACT nasal spray Place 2 sprays into the nose daily.     Multiple Vitamin (MULTI-VITAMIN) tablet Take 1 tablet by mouth daily.     omeprazole (PRILOSEC) 40 MG capsule TAKE 1 (ONE) CAPSULE BY MOUTH EVERY MORNING 30 MINUTES BEFORE BREAKFAST 90 capsule 1   SODIUM FLUORIDE 5000 PPM 1.1 % PSTE See admin instructions.     tirzepatide  [facility]) 5 MG/0.5ML Pen Inject 5 mg into the skin once a week. 6 mL 0   traZODone (DESYREL) 150 MG tablet TAKE 1 TABLET BY MOUTH EVERYDAY AT BEDTIME 90 tablet 0   venlafaxine XR (EFFEXOR-XR) 75 MG 24 hr capsule TAKE 3 CAPSULES BY MOUTH DAILY IN THE MORNING 270 capsule 1   fluticasone (FLONASE) 50 MCG/ACT nasal spray Place 2 sprays into both nostrils daily. 16 g 6   budesonide-formoterol (SYMBICORT) 160-4.5 MCG/ACT inhaler Inhale 2 puffs into the lungs 2 (two) times daily. 1 each 3   Evolocumab (REPATHA SURECLICK) 140 MG/ML SOAJ Inject 140 mg into the skin every 14 (fourteen) days. 2 mL 2   fluconazole (DIFLUCAN) 150 MG tablet Take 1 tablet (150 mg total) by mouth daily. 1 tablet 1   mometasone (NASONEX) 50 MCG/ACT nasal spray Place 2 sprays into the nose daily. 17 g 12   No facility-administered medications prior to visit.    Allergies  Allergen Reactions   Irbesartan Hives   Moxifloxacin Anaphylaxis, Other (See Comments) and Hives   Avelox [Moxifloxacin Hcl In Nacl]    Cymbalta [Duloxetine Hcl]    Duloxetine Hives   Oxycodone-Acetaminophen Hives    Review of Systems  Constitutional:  Negative for appetite change, fatigue and fever.  HENT:  Negative for congestion, ear pain, sinus pressure and sore throat.   Respiratory:  Negative for cough, chest tightness, shortness of breath and wheezing.   Cardiovascular:  Positive for chest pain (Left side of chest threw to back). Negative for palpitations.  Gastrointestinal:  Negative for abdominal pain, constipation, diarrhea, nausea and vomiting.  Genitourinary:  Negative for dysuria and hematuria.  Musculoskeletal:  Negative for arthralgias, back pain, joint swelling and myalgias.  Skin:  Negative for rash.  Neurological:  Positive for dizziness and headaches. Negative for weakness.  Psychiatric/Behavioral:  Negative for dysphoric mood. The patient is not nervous/anxious.  Objective:        01/30/2024    1:54 PM 12/04/2023     1:37 PM 11/26/2023    2:45 PM  Vitals with BMI  Height 5' 10.5" 5' 10.5" 5' 10.5"  Weight 294 lbs 292 lbs 3 oz 294 lbs  BMI 41.57 41.32 41.57  Systolic 168 138 098  Diastolic 78 76 74  Pulse 91 78     Orthostatic VS for the past 72 hrs (Last 3 readings):  Patient Position BP Location  01/30/24 1354 Sitting Left Arm     Physical Exam Vitals reviewed.  Constitutional:      Appearance: Normal appearance.  Neck:     Vascular: No carotid bruit.  Cardiovascular:     Rate and Rhythm: Normal rate and regular rhythm.     Heart sounds: Normal heart sounds.  Pulmonary:     Effort: Pulmonary effort is normal.     Breath sounds: Normal breath sounds.  Abdominal:     General: Bowel sounds are normal.     Palpations: Abdomen is soft.     Tenderness: There is no abdominal tenderness.  Skin:    Findings: Wound present.     Comments: Wound on bottom of great toe of the left foot  Neurological:     Mental Status: She is alert and oriented to person, place, and time.  Psychiatric:        Mood and Affect: Mood normal.        Behavior: Behavior normal.     Media Information   Document Information  Photos    01/30/2024 14:27  Attached To:  Office Visit on 01/30/24 with Langley Gauss, PA  Source Information  [address], [place], [place]  [facility]  Document History     Health Maintenance Due  Topic Date Due   OPHTHALMOLOGY EXAM  Never done   HIV Screening  Never done   Hepatitis C Screening  Never done   Pneumococcal Vaccine 95-16 Years old (2 of 2 - PCV) 08/02/2021   Zoster Vaccines- Shingrix (2 of 2) 08/05/2021   COVID-19 Vaccine (4 - 2024-25 season) 06/24/2023   Diabetic kidney evaluation - Urine ACR  12/09/2023    There are no preventive care reminders to display for this patient.   Lab Results  Component Value Date   TSH 1.640 11/06/2023   Lab Results  Component Value Date   WBC 9.5 11/29/2023   HGB 14.5 11/29/2023   HCT 42.9 11/29/2023   MCV 91 11/29/2023    PLT 252 11/29/2023   Lab Results  Component Value Date   NA 142 11/29/2023   K 4.3 11/29/2023   CO2 25 11/29/2023   GLUCOSE 126 (H) 11/29/2023   BUN 12 11/29/2023   CREATININE 0.72 11/29/2023   BILITOT 0.2 11/29/2023   ALKPHOS 81 11/29/2023   AST 17 11/29/2023   ALT 28 11/29/2023   PROT 6.2 11/29/2023   ALBUMIN 3.9 11/29/2023   CALCIUM 9.0 11/29/2023   ANIONGAP 11 04/16/2019   EGFR 97 11/29/2023   GFR 71.87 12/31/2019   Lab Results  Component Value Date   CHOL 235 (H) 11/29/2023   Lab Results  Component Value Date   HDL 52 11/29/2023   Lab Results  Component Value Date   LDLCALC 147 (H) 11/29/2023   Lab Results  Component Value Date   TRIG 200 (H) 11/29/2023   Lab Results  Component Value Date   CHOLHDL 4.5 (H) 11/29/2023   Lab Results  Component  Value Date   HGBA1C 7.1 (H) 11/29/2023       Assessment & Plan:  Acute pericarditis associated with other disease Assessment & Plan: Intermittent chest pain with differential diagnosis including pericarditis, pancreatitis, and reflux. EKG unchanged. Sarcoidosis may contribute. Echocardiogram preferred for heart assessment. - Order echocardiogram to assess heart structure and function. - Start high-dose NSAIDs (ibuprofen 800 mg every 8 hours) for 3 weeks with food. - Continue omeprazole to prevent NSAID-induced gastric erosion. - Refer to cardiology for further evaluation.  Orders: -     CBC with Differential/Platelet -     Comprehensive metabolic panel with GFR -     Sedimentation rate -     C-reactive protein  Hashimoto's thyroiditis Assessment & Plan: Hashimoto's thyroiditis with voice changes and neck discomfort. Hair thinning reported. Blood tests planned. - Order thyroid function tests. - Check vitamin D levels.  Orders: -     T4, free -     TSH  Vitamin D deficiency Assessment & Plan: Labs drawn today Will continue treatment if lab returns low   Wound of left foot Assessment &  Plan: Pressure ulcer on foot with numbness. Previous painful ulcer. Monitoring advised. - Document the ulcer with a photograph in the chart.   Primary hypertension Assessment & Plan: Elevated blood pressure possibly due to pain and Allegra D. Headaches reported. Recent medication change may not be effective yet. - Recheck blood pressure. - Advise switching from Allegra D to regular Allegra to avoid decongestant effects on blood pressure.   Diabetes mellitus without complication [facility]) Assessment & Plan: Diabetes management with Mounjaro ongoing. Improved A1c levels. Monitoring symptoms related to [facility] advised. - Monitor diabetes management and symptoms related to [facility].        No orders of the defined types were placed in this encounter.   Orders Placed This Encounter  Procedures   CBC with Differential/Platelet   Comprehensive metabolic panel with GFR   T4, free   TSH   Sedimentation rate   C-reactive protein    General Health Maintenance Due for foot exam. Hydration and reduced soft drink intake encouraged. - Perform foot exam. - Encourage hydration and reduction of soft drink intake.  Follow-up Follow-up includes monitoring symptoms, blood pressure, and thyroid function. Cardiologist referral for echocardiogram results. - Follow up with cardiology for echocardiogram results. - Monitor blood pressure and adjust treatment as needed. - Reassess thyroid function and vitamin D levels after testing.      Follow-up: No follow-ups on file.  An After Visit Summary was printed and given to the patient.   I,Lauren M Auman,acting as a Neurosurgeon for [employer], PA.,have documented all relevant documentation on the behalf of Langley Gauss, PA,as directed by  Langley Gauss, PA while in the presence of Langley Gauss, [place].    Langley Gauss, [place] [facility] [phone]

## 2024-01-30 NOTE — Assessment & Plan Note (Signed)
 Labs drawn today Will continue treatment if lab returns low

## 2024-01-31 ENCOUNTER — Encounter: Payer: Self-pay | Admitting: Physician Assistant

## 2024-01-31 LAB — CBC WITH DIFFERENTIAL/PLATELET
Basophils Absolute: 0.1 10*3/uL (ref 0.0–0.2)
Basos: 0 %
EOS (ABSOLUTE): 0.5 10*3/uL — ABNORMAL HIGH (ref 0.0–0.4)
Eos: 5 %
Hematocrit: 44.9 % (ref 34.0–46.6)
Hemoglobin: 15.2 g/dL (ref 11.1–15.9)
Immature Grans (Abs): 0 10*3/uL (ref 0.0–0.1)
Immature Granulocytes: 0 %
Lymphocytes Absolute: 2.6 10*3/uL (ref 0.7–3.1)
Lymphs: 23 %
MCH: 30.6 pg (ref 26.6–33.0)
MCHC: 33.9 g/dL (ref 31.5–35.7)
MCV: 91 fL (ref 79–97)
Monocytes Absolute: 0.6 10*3/uL (ref 0.1–0.9)
Monocytes: 6 %
Neutrophils Absolute: 7.6 10*3/uL — ABNORMAL HIGH (ref 1.4–7.0)
Neutrophils: 66 %
Platelets: 295 10*3/uL (ref 150–450)
RBC: 4.96 x10E6/uL (ref 3.77–5.28)
RDW: 12.1 % (ref 11.7–15.4)
WBC: 11.3 10*3/uL — ABNORMAL HIGH (ref 3.4–10.8)

## 2024-01-31 LAB — COMPREHENSIVE METABOLIC PANEL WITH GFR
ALT: 28 IU/L (ref 0–32)
AST: 20 IU/L (ref 0–40)
Albumin: 4.3 g/dL (ref 3.8–4.9)
Alkaline Phosphatase: 106 IU/L (ref 44–121)
BUN/Creatinine Ratio: 16 (ref 9–23)
BUN: 11 mg/dL (ref 6–24)
Bilirubin Total: <0.2 mg/dL (ref 0.0–1.2)
CO2: 25 mmol/L (ref 20–29)
Calcium: 10.1 mg/dL (ref 8.7–10.2)
Chloride: 99 mmol/L (ref 96–106)
Creatinine, Ser: 0.7 mg/dL (ref 0.57–1.00)
Globulin, Total: 2.6 g/dL (ref 1.5–4.5)
Glucose: 162 mg/dL — ABNORMAL HIGH (ref 70–99)
Potassium: 4.5 mmol/L (ref 3.5–5.2)
Sodium: 140 mmol/L (ref 134–144)
Total Protein: 6.9 g/dL (ref 6.0–8.5)
eGFR: 101 mL/min/{1.73_m2} (ref >59)

## 2024-01-31 LAB — T4, FREE: Free T4: 0.92 ng/dL (ref 0.82–1.77)

## 2024-01-31 LAB — SEDIMENTATION RATE: Sed Rate: 26 mm/h (ref 0–40)

## 2024-01-31 LAB — TSH: TSH: 2.43 u[IU]/mL (ref 0.450–4.500)

## 2024-01-31 LAB — C-REACTIVE PROTEIN: CRP: 8 mg/L (ref 0–10)

## 2024-02-01 NOTE — Addendum Note (Signed)
 Addended by: Precious Reel on: 02/01/2024 12:00 PM   Modules accepted: Orders

## 2024-02-05 ENCOUNTER — Encounter: Payer: Self-pay | Admitting: Physician Assistant

## 2024-02-05 ENCOUNTER — Other Ambulatory Visit: Payer: Self-pay | Admitting: Physician Assistant

## 2024-02-05 DIAGNOSIS — R1084 Generalized abdominal pain: Secondary | ICD-10-CM

## 2024-02-06 ENCOUNTER — Other Ambulatory Visit: Payer: Self-pay

## 2024-02-06 DIAGNOSIS — J189 Pneumonia, unspecified organism: Secondary | ICD-10-CM | POA: Insufficient documentation

## 2024-02-06 DIAGNOSIS — R590 Localized enlarged lymph nodes: Secondary | ICD-10-CM | POA: Insufficient documentation

## 2024-02-14 ENCOUNTER — Ambulatory Visit

## 2024-02-14 VITALS — BP 144/80 | HR 77 | Ht 70.6 in | Wt 294.4 lb

## 2024-02-14 DIAGNOSIS — I10 Essential (primary) hypertension: Secondary | ICD-10-CM

## 2024-02-14 DIAGNOSIS — R002 Palpitations: Secondary | ICD-10-CM

## 2024-02-14 DIAGNOSIS — D869 Sarcoidosis, unspecified: Secondary | ICD-10-CM

## 2024-02-14 DIAGNOSIS — E782 Mixed hyperlipidemia: Secondary | ICD-10-CM | POA: Diagnosis not present

## 2024-02-14 DIAGNOSIS — R079 Chest pain, unspecified: Secondary | ICD-10-CM

## 2024-02-14 MED ORDER — ROSUVASTATIN CALCIUM 10 MG PO TABS: 10.0000 mg | ORAL_TABLET | Freq: Every day | ORAL | 3 refills | Status: AC

## 2024-02-14 MED ORDER — ASPIRIN 81 MG PO TBEC: 81.0000 mg | DELAYED_RELEASE_TABLET | Freq: Every day | ORAL | 3 refills | Status: AC

## 2024-02-14 NOTE — Assessment & Plan Note (Signed)
 Last lipid panel from February 2025 total cholesterol 235, triglycerides 200, LDL 147, HDL 52. Suboptimal. Previously tolerated rosuvastatin  but switched next visit due to inadequate response. Agree with continuing next visit. In addition would recommend continuing rosuvastatin  given the added benefit. Will prescribe rosuvastatin  10 mg once daily. If lipid panel remains uncontrolled despite optimization of diabetes and starting and titrating up statin therapy, can consider PCSK9 inhibitors. Given diabetes, target LDL below 70 mg/dL.

## 2024-02-14 NOTE — Assessment & Plan Note (Signed)
 Appears to be suboptimal today. Reports reasonable control otherwise. Continue diltiazem  120 mg once daily. Titrate up the dose as needed.

## 2024-02-14 NOTE — Assessment & Plan Note (Addendum)
 Atypical chest pain.  Unclear etiology. Appears likely noncardiac. There was suspicion for pericarditis and she was given high-dose NSAIDs without any significant difference. Inflammatory markers were unremarkable recently.  She does have cardiovascular risk factors. Also has pulmonary sarcoidosis.  From cardiac standpoint to rule out any significant coronary artery disease related component we will proceed with cardiac PET/CT stress test to assess for ischemia.  Obtain cardiac MRI for further evaluation for cardiac involvement from sarcoidosis. Advised to reestablish care with pulmonologist given history of pulmonary sarcoidosis. If there is any further cardiac involvement for sarcoidosis, will refer to specialty clinic.  Okay to suspend high-dose NSAIDs.

## 2024-02-14 NOTE — Addendum Note (Signed)
 Addended by: Einar Grave on: 02/14/2024 10:25 AM   Modules accepted: Orders

## 2024-02-14 NOTE — Patient Instructions (Addendum)
 Medication Instructions:  Your physician has recommended you make the following change in your medication:   Start taking 81 mg coated aspirin  daily  Start Crestor  10 mg daily.  *If you need a refill on your cardiac medications before your next appointment, please call your pharmacy*   Lab Work: None ordered If you have labs (blood work) drawn today and your tests are completely normal, you will receive your results only by: MyChart Message (if you have MyChart) OR A paper copy in the mail If you have any lab test that is abnormal or we need to change your treatment, we will call you to review the results.   Testing/Procedures: A zio monitor was ordered today. It will remain on for 14 days. Remove 02/28/24. You will then return monitor and event diary in provided box. It takes 1-2 weeks for report to be downloaded and returned to us . We will call you with the results. If monitor falls off or has orange flashing light, please call Zio for further instructions.     [facility] [address] [place], [place] [address] Please take advantage of the free valet parking available at the [facility] and [employer] (Entrance C).  Proceed to the [facility] Radiology Department (First Floor) for check-in.   Magnetic resonance imaging (MRI) is a painless test that produces images of the inside of the body without using Xrays.  During an MRI, strong magnets and radio waves work together in a Data processing manager to form detailed images.   MRI images may provide more details about a medical condition than X-rays, CT scans, and ultrasounds can provide.  You may be given earphones to listen for instructions.  You may eat a light breakfast and take medications as ordered with the exception of furosemide, hydrochlorothiazide, chlorthalidone or spironolactone (or any other fluid pill). If you are undergoing a stress MRI, please avoid stimulants for 12 hr prior to test. (I.e. Caffeine,  nicotine, chocolate, or antihistamine medications)  If your provider has ordered anti-anxiety medications for this test, then you will need a driver.  An IV will be inserted into one of your veins. Contrast material will be injected into your IV. It will leave your body through your urine within a day. You may be told to drink plenty of fluids to help flush the contrast material out of your system.  You will be asked to remove all metal, including: Watch, jewelry, and other metal objects including hearing aids, hair pieces and dentures. Also wearable glucose monitoring systems (ie. Freestyle Libre and Omnipods) (Braces and fillings normally are not a problem.)   TEST WILL TAKE APPROXIMATELY 1 HOUR  PLEASE NOTIFY SCHEDULING AT LEAST 24 HOURS IN ADVANCE IF YOU ARE UNABLE TO KEEP YOUR APPOINTMENT. [phone]  For more information and frequently asked questions, please visit our website : [email or web address]  Please call the Cardiac Imaging Nurse Navigators with any questions/concerns. [phone] Office   How to Prepare for Your Cardiac PET/CT Stress Test:  1. Please do not take these medications before your test:   Medications that may interfere with the cardiac pharmacological stress agent (ex. nitrates - including erectile dysfunction medications, isosorbide mononitrate- [please start to hold this medication the day before the test], tamulosin or beta-blockers) the day of the exam. (Erectile dysfunction medication should be held for at least 72 hrs prior to test) Theophylline containing medications for 12 hours. Dipyridamole 48 hours prior to the test. Your remaining medications may be taken with water.  2.  Nothing to eat or drink, except water, 3 hours prior to arrival time.   NO caffeine/decaffeinated products, or chocolate 12 hours prior to arrival.  3. NO perfume, cologne or lotion on chest or abdomen area.          4. Total time is 1 to 2 hours; you may want to  bring reading material for the waiting time.  5. Please report to Radiology at the [facility] Main Entrance 30 minutes early for your test.  [address] [place], [place] [address]   Diabetic Preparation:  Hold oral medications. You may take NPH and Lantus insulin. Do not take Humalog or Humulin R (Regular Insulin) the day of your test. Check blood sugars prior to leaving the house. If able to eat breakfast prior to 3 hour fasting, you may take all medications, including your insulin, Do not worry if you miss your breakfast dose of insulin - start at your next meal. Patients who wear a continuous glucose monitor MUST remove the device prior to scanning.  IF YOU THINK YOU MAY BE PREGNANT, OR ARE NURSING PLEASE INFORM THE TECHNOLOGIST.  In preparation for your appointment, medication and supplies will be purchased.  Appointment availability is limited, so if you need to cancel or reschedule, please call the Radiology Department at [phone] Maryan Smalling) OR [phone] [facility])  24 hours in advance to avoid a cancellation fee of $100.00  What to Expect After you Arrive:  Once you arrive and check in for your appointment, you will be taken to a preparation room within the Radiology Department.  A technologist or Nurse will obtain your medical history, verify that you are correctly prepped for the exam, and explain the procedure.  Afterwards,  an IV will be started in your arm and electrodes will be placed on your skin for EKG monitoring during the stress portion of the exam. Then you will be escorted to the PET/CT scanner.  There, staff will get you positioned on the scanner and obtain a blood pressure and EKG.  During the exam, you will continue to be connected to the EKG and blood pressure machines.  A small, safe amount of a radioactive tracer will be injected in your IV to obtain a series of pictures of your heart along with an injection of a stress agent.    After your  Exam:  It is recommended that you eat a meal and drink a caffeinated beverage to counter act any effects of the stress agent.  Drink plenty of fluids for the remainder of the day and urinate frequently for the first couple of hours after the exam.  Your doctor will inform you of your test results within 7-10 business days.  For more information and frequently asked questions, please visit our website : [email or web address]  For questions about your test or how to prepare for your test, please call: Cardiac Imaging Nurse Navigators Office: [phone]   Follow-Up: At [facility], you and your health needs are our priority.  As part of our continuing mission to provide you with exceptional heart care, we have created designated Provider Care Teams.  These Care Teams include your primary Cardiologist (physician) and Advanced Practice Providers (APPs -  Physician Assistants and Nurse Practitioners) who all work together to provide you with the care you need, when you need it.  We recommend signing up for the patient portal called "MyChart".  Sign up information is provided on this After Visit Summary.  MyChart is  used to connect with patients for Virtual Visits (Telemedicine).  Patients are able to view lab/test results, encounter notes, upcoming appointments, etc.  Non-urgent messages can be sent to your provider as well.   To learn more about what you can do with MyChart, go to [email or web address].    Your next appointment:   3 month(s)  The format for your next appointment:   In Person  Provider:   Bertha Broad, MD    Other Instructions none  Important Information About Sugar

## 2024-02-14 NOTE — Progress Notes (Signed)
 Cardiology Consultation:    Date:  02/14/2024   ID:  Holly [name], DOB 1966-08-16, MRN [id number]  PCP:  Odilia Bennett, PA  Cardiologist:  Daymon Evans My Madariaga, MD   Referring MD: Odilia Bennett, PA   No chief complaint on file.    ASSESSMENT AND PLAN:   Ms. 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 Referred for further evaluation of atypical chest pain with suspicion for pericarditis.  Problem List Items Addressed This Visit     Hypertension   Appears to be suboptimal today. Reports reasonable control otherwise. Continue diltiazem  120 mg once daily. Titrate up the dose as needed.       Sarcoidosis   History of biopsy diagnosed with pulmonary sarcoidosis in 2020 per patient report. Obtain cardiac MRI for further evaluation for cardiac involvement from sarcoidosis. Advised to reestablish care with pulmonologist given history of pulmonary sarcoidosis. If there is any further cardiac involvement for sarcoidosis, will refer to specialty clinic  Will also obtain Zio patch Holter monitor for 14 days.       Mixed hyperlipidemia   Last lipid panel from February 2025 total cholesterol 235, triglycerides 200, LDL 147, HDL 52. Suboptimal. Previously tolerated rosuvastatin  but switched next visit due to inadequate response. Agree with continuing next visit. In addition would recommend continuing rosuvastatin  given the added benefit. Will prescribe rosuvastatin  10 mg once daily. If lipid panel remains uncontrolled despite optimization of diabetes and starting and titrating up statin therapy, can consider PCSK9 inhibitors. Given diabetes, target LDL below 70 mg/dL.       Chest pain of uncertain etiology - Primary   Atypical chest pain.  Unclear etiology. Appears likely noncardiac. There was  suspicion for pericarditis and she was given high-dose NSAIDs without any significant difference. Inflammatory markers were unremarkable recently.  She does have cardiovascular risk factors. Also has pulmonary sarcoidosis.  From cardiac standpoint to rule out any significant coronary artery disease related component we will proceed with cardiac PET/CT stress test to assess for ischemia.  Obtain cardiac MRI for further evaluation for cardiac involvement from sarcoidosis. Advised to reestablish care with pulmonologist given history of pulmonary sarcoidosis. If there is any further cardiac involvement for sarcoidosis, will refer to specialty clinic.  Okay to suspend high-dose NSAIDs.       Relevant Orders   EKG 12-Lead (Completed)   Palpitations   Intermittent infrequent palpitations without any sustained episodes. Given her history of sarcoidosis, will assess with Zio patch for 14 days.       Return to clinic tentatively in 3 months   History of Present Illness:    Holly [name] is a 58 y.o. female who is being seen today for the evaluation of chest pain in the setting of pulmonary sarcoidosis at the request of Odilia Bennett, [place].   Has history of sarcoidosis with pulmonary involvement diagnosed by lymph node biopsyJune 2020 [lost to follow-up with pulmonology since December 2021], diabetes mellitus, Hashimoto's thyroiditis, obesity, hypertension, dyslipidemia. Denies any prior history of CAD, CHF, MI, CVA.  Pleasant woman here for the visit by herself.  Previously worked as a Runner, broadcasting/film/video in school.  Currently not working.  She mentions for the past 3 weeks has chest and upper back discomfort that occurs randomly almost on a daily basis and can last for few minutes to hours and describes this as pressure to dull  achy sensation.  No obvious aggravating or relieving factors.  Resolves spontaneously.  Not associated with any shortness of breath or any other symptoms. Was prescribed  high-dose NSAID therapy which has not resolved her symptoms.  Occasionally she does report skipped beat on extra beat sensation.  No sustained palpitations. No dizziness, lightheadedness or syncopal episodes. No blood in urine or stools. Has been on weight loss program recently started on Mounjaro .  For blood pressure control she has been on diltiazem  120 mg once daily and tolerating well. For dyslipidemia she was previously on rosuvastatin  and tolerated medications well but apparently did not have good improvement and was later switched to next visit [bempedoic acid and ezetimibe] tolerating this well.  Since her diagnosis of sarcoidosis after lymph node biopsy in 2020 she has been lost to follow-up  Does not smoke. Does not drink alcohol. No illicit drug use.  Does report family history of heart disease at a young age in various family members.  Reports remote history of stress test that was unremarkable.  I do not have copy of these results.  EKG in the clinic today shows sinus rhythm heart rate 77/min, PR interval 172 ms, QRS duration 72 ms, QTc 423 ms.  Anteroseptal Q waves.  Blood work from 01-30-2024 noted normal CRP 8, Normal ESR 26. Thyroid panel with TSH 2.4 normal, free T40.92 Normal BUN 11, creatinine 0.7, EGFR 101 Normal electrolytes, transaminases and alkaline phosphatase CBC unremarkable with hemoglobin 15.2, hematocrit 44.9, platelets 295, WBC mildly elevated 11.3. Prior lipid panel from 2-03/2024 with total cholesterol 235, triglycerides 200, LDL 147, HDL 52 Hemoglobin A1c 7.1 in February 2025.  Past Medical History:  Diagnosis Date   Acute non-recurrent maxillary sinusitis 06/17/2020   Acute pain of left shoulder 09/12/2023   Allergic rhinitis 05/26/2019   Anxiety    At risk for obstructive sleep apnea 09/28/2020   BMI 40.0-44.9, adult (HCC) 08/02/2020   Cellulitis of foot, right 09/24/2023   Chronic sinusitis of both maxillary sinuses 12/08/2023   COVID-19  virus detected 12/30/2019   10/30/2019-SARS-CoV-2-positive Didn't require hospitalization     Depression    Diabetes mellitus without complication (HCC)    Diabetic glomerulopathy (HCC)    Diabetic polyneuropathy (HCC) 08/02/2020   Fatty liver    Fracture of intermediate cuneiform bone of right foot 09/16/2023   Generalized abdominal pain 01/09/2023   Goiter    Hashimoto's thyroiditis    Hilar adenopathy 03/20/19  04/11/19   CTA CHEST and PET per DR. Elana Grayer LEWIS   History of kidney stones    Hospital discharge follow-up 09/24/2023   Hypertension    Hypothyroidism (acquired) 08/02/2020   Insomnia 11/12/2022   Mediastinal adenopathy    PER CTA CHEST PET SCAN per DR. Gerardine Knock LEWIS   Mixed hyperlipidemia 08/02/2020   Moderate recurrent major depression (HCC) 07/09/2017   Morbid obesity (HCC) 08/02/2020   OSA (obstructive sleep apnea) 11/23/2020   Pericarditis 01/30/2024   Pharyngoesophageal dysphagia 01/09/2023   Pneumonia    Sarcoidosis 03/01/2019   PER PET SCAN per DR. Leanora Prophet LEWIS     Severe recurrent major depression without psychotic features (HCC)    Strain of lumbar region 09/10/2022   Supraclavicular adenopathy 03/20/2019   PER CTA CHEST and PET per DR. Norwood Beets LEWIS   Type 2 diabetes mellitus with hyperglycemia, without long-term current use of insulin (HCC) 08/10/2023   Urticaria due to drug allergy 11/26/2023   Vaginal yeast infection 07/31/2023   Vitamin D  deficiency 12/09/2022   Wound  of left foot 01/30/2024    Past Surgical History:  Procedure Laterality Date   ABDOMINAL HYSTERECTOMY     COMPLETE   ADHESIOLYSIS     APPENDECTOMY     CESAREAN SECTION     MEDIASTINOSCOPY N/A 04/18/2019   Procedure: MEDIASTINOSCOPY;  Surgeon: Norita Beauvais, MD;  Location: [facility] OR;  Service: Thoracic;  Laterality: N/A;   VIDEO BRONCHOSCOPY WITH ENDOBRONCHIAL ULTRASOUND N/A 04/18/2019   Procedure: VIDEO BRONCHOSCOPY WITH ENDOBRONCHIAL ULTRASOUND;  Surgeon: Norita Beauvais,  MD;  Location: [facility] OR;  Service: Thoracic;  Laterality: N/A;    Current Medications: Current Meds  Medication Sig   albuterol  (VENTOLIN  HFA) 108 (90 Base) MCG/ACT inhaler TAKE 2 PUFFS BY MOUTH EVERY 6 HOURS AS NEEDED FOR WHEEZE OR SHORTNESS OF BREATH   Bempedoic Acid-Ezetimibe (NEXLIZET ) 180-10 MG TABS Take 1 tablet by mouth daily.   budesonide -formoterol  (SYMBICORT ) 160-4.5 MCG/ACT inhaler Inhale 2 puffs into the lungs 2 (two) times daily.   buPROPion  (WELLBUTRIN  XL) 150 MG 24 hr tablet TAKE 1 TABLET BY MOUTH EVERY DAY   busPIRone  (BUSPAR ) 7.5 MG tablet TAKE 1 TABLET BY MOUTH TWICE A DAY   clonazePAM  (KLONOPIN ) 0.5 MG tablet Take 1 tablet (0.5 mg total) by mouth 2 (two) times daily as needed for anxiety.   cyclobenzaprine  (FLEXERIL ) 5 MG tablet Take 1 tablet (5 mg total) by mouth 3 (three) times daily as needed for muscle spasms.   diltiazem  (CARDIZEM  CD) 120 MG 24 hr capsule TAKE 1 CAPSULE BY MOUTH EVERY DAY   famotidine  (PEPCID ) 40 MG tablet TAKE 1 TABLET BY MOUTH TWICE A DAY   FARXIGA  5 MG TABS tablet TAKE 1 TABLET BY MOUTH EVERY DAY BEFORE BREAKFAST   gabapentin  (NEURONTIN ) 300 MG capsule TAKE 2 CAPSULES BY MOUTH 2 TIMES DAILY.   levothyroxine  (SYNTHROID ) 25 MCG tablet TAKE 1 TABLET BY MOUTH EVERY DAY BEFORE BREAKFAST   metFORMIN  (GLUCOPHAGE ) 1000 MG tablet TAKE 1 TABLET (1,000 MG TOTAL) BY MOUTH TWICE A DAY WITH FOOD   mometasone  (NASONEX ) 50 MCG/ACT nasal spray Place 2 sprays into the nose daily.   Multiple Vitamin (MULTI-VITAMIN) tablet Take 1 tablet by mouth daily.   omeprazole (PRILOSEC) 40 MG capsule TAKE 1 (ONE) CAPSULE BY MOUTH EVERY MORNING 30 MINUTES BEFORE BREAKFAST   SODIUM FLUORIDE 5000 PPM 1.1 % PSTE See admin instructions.   tirzepatide  (MOUNJARO ) 5 MG/0.5ML Pen Inject 5 mg into the skin once a week.   traZODone  (DESYREL ) 150 MG tablet TAKE 1 TABLET BY MOUTH EVERYDAY AT BEDTIME   venlafaxine XR (EFFEXOR-XR) 75 MG 24 hr capsule TAKE 3 CAPSULES BY MOUTH DAILY IN THE MORNING      Allergies:   Irbesartan , Moxifloxacin, Avelox [moxifloxacin hcl in nacl], Cymbalta [duloxetine hcl], Duloxetine, and Oxycodone-acetaminophen   Social History   Socioeconomic History   Marital status: Divorced    Spouse name: Not on file   Number of children: Not on file   Years of education: Not on file   Highest education level: Master's degree (e.g., MA, MS, MEng, MEd, MSW, MBA)  Occupational History   Not on file  Tobacco Use   Smoking status: Never   Smokeless tobacco: Never  Vaping Use   Vaping status: Never Used  Substance and Sexual Activity   Alcohol use: Yes    Comment: VERY RARE OCCASION   Drug use: Never   Sexual activity: Not Currently  Other Topics Concern   Not on file  Social History Narrative   Not on file  Social Drivers of Health   Financial Resource Strain: Medium Risk (09/30/2023)   Overall Financial Resource Strain (CARDIA)    Difficulty of Paying Living Expenses: Somewhat hard  Food Insecurity: Food Insecurity Present (09/30/2023)   Hunger Vital Sign    Worried About Running Out of Food in the Last Year: Sometimes true    Ran Out of Food in the Last Year: Never true  Transportation Needs: No Transportation Needs (09/30/2023)   PRAPARE - Administrator, Civil Service (Medical): No    Lack of Transportation (Non-Medical): No  Physical Activity: Insufficiently Active (09/30/2023)   Exercise Vital Sign    Days of Exercise per Week: 2 days    Minutes of Exercise per Session: 20 min  Stress: Stress Concern Present (09/30/2023)   [employer] of Occupational Health - Occupational Stress Questionnaire    Feeling of Stress : To some extent  Social Connections: Moderately Integrated (09/30/2023)   Social Connection and Isolation Panel [NHANES]    Frequency of Communication with Friends and Family: More than three times a week    Frequency of Social Gatherings with Friends and Family: Once a week    Attends Religious Services: More than  4 times per year    Active Member of [employer] or Organizations: Yes    Attends Banker Meetings: 1 to 4 times per year    Marital Status: Divorced     Family History: The patient's family history includes Anuerysm in her mother; Bipolar disorder in her sister; Breast cancer in her maternal grandmother and mother; COPD in her father; Cancer in her mother, paternal grandfather, and paternal uncle; Cancer (age of onset: 71) in her maternal grandmother; Congestive Heart Failure in her father; Diabetes in her sister; Fibromyalgia in her sister; Hyperlipidemia in her father. ROS:   Please see the history of present illness.    All 14 point review of systems negative except as described per history of present illness.  EKGs/Labs/Other Studies Reviewed:    The following studies were reviewed today:   EKG:  EKG Interpretation Date/Time:  Thursday February 14 2024 08:45:43 EDT Ventricular Rate:  77 PR Interval:  172 QRS Duration:  72 QT Interval:  374 QTC Calculation: 423 R Axis:   45  Text Interpretation: Normal sinus rhythm Low voltage QRS Cannot rule out Anterior infarct , age undetermined Abnormal ECG When compared with ECG of 16-Apr-2019 14:08, Minimal criteria for Anterior infarct are now Present Confirmed by Bertha Broad reddy [phone]) on 02/14/2024 9:00:25 AM    Recent Labs: 01/30/2024: ALT 28; BUN 11; Creatinine, Ser 0.70; Hemoglobin 15.2; Platelets 295; Potassium 4.5; Sodium 140; TSH 2.430  Recent Lipid Panel    Component Value Date/Time   CHOL 235 (H) 11/29/2023 0814   TRIG 200 (H) 11/29/2023 0814   HDL 52 11/29/2023 0814   CHOLHDL 4.5 (H) 11/29/2023 0814   LDLCALC 147 (H) 11/29/2023 0814    Physical Exam:    VS:  BP (!) 144/80   Pulse 77   Ht 5' 10.6" (1.793 m)   Wt 294 lb 6.4 oz (133.5 kg)   SpO2 93%   BMI 41.53 kg/m     Wt Readings from Last 3 Encounters:  02/14/24 294 lb 6.4 oz (133.5 kg)  01/30/24 294 lb (133.4 kg)  12/04/23 292 lb 3.2 oz (132.5  kg)     GENERAL:  Well nourished, well developed in no acute distress NECK: No JVD; No carotid bruits CARDIAC: RRR, S1 and S2  present, no murmurs, no rubs, no gallops CHEST:  Clear to auscultation without rales, wheezing or rhonchi  Extremities: No pitting pedal edema. Pulses bilaterally symmetric with radial 2+ and dorsalis pedis 2+ NEUROLOGIC:  Alert and oriented x 3  Medication Adjustments/Labs and Tests Ordered: Current medicines are reviewed at length with the patient today.  Concerns regarding medicines are outlined above.  Orders Placed This Encounter  Procedures   EKG 12-Lead   No orders of the defined types were placed in this encounter.   Signed, Lura Sallies, MD, MPH, [facility]. 02/14/2024 9:36 AM    [facility]

## 2024-02-14 NOTE — Assessment & Plan Note (Signed)
 Intermittent infrequent palpitations without any sustained episodes. Given her history of sarcoidosis, will assess with Zio patch for 14 days.

## 2024-02-14 NOTE — Assessment & Plan Note (Signed)
 History of biopsy diagnosed with pulmonary sarcoidosis in 2020 per patient report. Obtain cardiac MRI for further evaluation for cardiac involvement from sarcoidosis. Advised to reestablish care with pulmonologist given history of pulmonary sarcoidosis. If there is any further cardiac involvement for sarcoidosis, will refer to specialty clinic  Will also obtain Zio patch Holter monitor for 14 days.

## 2024-02-15 LAB — HEMOGLOBIN AND HEMATOCRIT, BLOOD
Hematocrit: 48.2 % — ABNORMAL HIGH (ref 34.0–46.6)
Hemoglobin: 15.8 g/dL (ref 11.1–15.9)

## 2024-03-10 ENCOUNTER — Other Ambulatory Visit: Payer: Self-pay | Admitting: Physician Assistant

## 2024-03-10 DIAGNOSIS — F331 Major depressive disorder, recurrent, moderate: Secondary | ICD-10-CM

## 2024-03-14 ENCOUNTER — Other Ambulatory Visit: Payer: Self-pay

## 2024-03-27 ENCOUNTER — Encounter: Payer: Self-pay | Admitting: Physician Assistant

## 2024-03-27 DIAGNOSIS — E1165 Type 2 diabetes mellitus with hyperglycemia: Secondary | ICD-10-CM

## 2024-03-27 MED ORDER — TIRZEPATIDE 7.5 MG/0.5ML ~~LOC~~ SOAJ
7.5000 mg | SUBCUTANEOUS | 1 refills | Status: DC
Start: 2024-03-27 — End: 2024-06-30

## 2024-04-02 ENCOUNTER — Ambulatory Visit: Payer: 59 | Admitting: Physician Assistant

## 2024-04-03 ENCOUNTER — Other Ambulatory Visit: Payer: Self-pay

## 2024-04-03 MED ORDER — LEVOTHYROXINE SODIUM 25 MCG PO TABS: 25.0000 ug | ORAL_TABLET | Freq: Every day | ORAL | 0 refills | Status: AC

## 2024-05-05 ENCOUNTER — Encounter (HOSPITAL_COMMUNITY): Payer: Self-pay

## 2024-05-07 ENCOUNTER — Other Ambulatory Visit: Payer: Self-pay

## 2024-05-07 ENCOUNTER — Ambulatory Visit (HOSPITAL_COMMUNITY)
Admission: RE | Admit: 2024-05-07 | Discharge: 2024-05-07 | Disposition: A | Discharge status: Home / Self Care | Source: Ambulatory Visit

## 2024-05-07 DIAGNOSIS — R079 Chest pain, unspecified: Secondary | ICD-10-CM | POA: Insufficient documentation

## 2024-05-07 DIAGNOSIS — D869 Sarcoidosis, unspecified: Secondary | ICD-10-CM | POA: Insufficient documentation

## 2024-05-07 MED ORDER — GADOBUTROL 1 MMOL/ML IV SOLN
10.0000 mL | Freq: Once | INTRAVENOUS | Status: AC | PRN
  Administered 2024-05-07: 10 mL via INTRAVENOUS

## 2024-05-10 ENCOUNTER — Ambulatory Visit: Payer: Self-pay

## 2024-05-10 DIAGNOSIS — R002 Palpitations: Secondary | ICD-10-CM

## 2024-05-15 ENCOUNTER — Ambulatory Visit

## 2024-05-18 NOTE — ED Provider Notes (Signed)
 Chief Complaint  Patient presents with  . Flank Pain    Pt BIB EMS for right flank pain radiating to lower abdomen since 1100 yesterday, dysuria, and nausea.  15 mg toradol given en route with pain now 0/10.       HPI 58 year old female past medical history of sarcoidosis, Hashimoto's thyroid disease, diverticulitis, diabetes, nephrolithiasis presents today for right flank pain which started yesterday.  Patient states symptoms are intermittent, and states the pain radiates from the flank to the lower abdomen.  Patient also endorses dysuria at this time.  On arrival, EMS gave patient Toradol at that time, states symptoms have improved.  Patient denies any fever, chills, chest pain, shortness of breath, vomiting, diarrhea, or any changes in bowel habits at this time.  Patient states symptoms feel similar to prior nephrolithiasis episode.  Patient did not require surgery for prior episode.   History provided by:  Patient     Patient History Medical History[1] Surgical History[2] Family History[3] Social History[4]    Review of Systems Review of Systems  Gastrointestinal:  Positive for abdominal pain and nausea.  Genitourinary:  Positive for dysuria and flank pain.      Physical Exam ED Triage Vitals  Temp 05/18/24 0206 98.6 F (37 C)  Heart Rate 05/18/24 0206 84  Resp 05/18/24 0206 18  BP 05/18/24 0206 152/66  MAP (mmHg) 05/18/24 0448 99  SpO2 05/18/24 0206 100 %  O2 Device 05/18/24 0206 None (Room air)  O2 Flow Rate (L/min) --   Weight 05/18/24 0206 132 kg (290 lb 9.1 oz)   Physical Exam Vitals and nursing note reviewed.  Constitutional:      Appearance: Normal appearance.   Cardiovascular:     Rate and Rhythm: Normal rate and regular rhythm.     Pulses: Normal pulses.     Heart sounds: Normal heart sounds.  Pulmonary:     Effort: Pulmonary effort is normal.     Breath sounds: Normal breath sounds.  Abdominal:     Palpations: Abdomen is soft.      Tenderness: There is no abdominal tenderness. There is no right CVA tenderness or left CVA tenderness.   Skin:    General: Skin is warm and dry.     Capillary Refill: Capillary refill takes less than 2 seconds.   Neurological:     General: No focal deficit present.     Mental Status: She is alert and oriented to person, place, and time.        CHA2DS2-VASc Score: N/A  Glasgow Coma Scale Score: 15                 Procedures                       ED Course & MDM ED Course as of 05/18/24 0647  Sun May 18, 2024  0339 CBC with Differential(!):   WBC 15.64(!)  Monocyte Distribution Width 18.6  RBC 4.70  Hemoglobin 14.4  Hematocrit 42  Mean Corpuscular Volume (MCV) 89  MCH 31  Mean Corpuscular Hemoglobin Conc (MCHC) 34  Red Cell Distribution Width (RDW-CV) 12.9  Platelet Count (Plt) 274  Mean Platelet Volume (MPV) 7.2(!)  Neutrophils % 83  Lymphocytes % 9  Monocytes % 6  Eosinophils % 1  Basophils % 1  nRBC % 0  Neutrophils Absolute 13.00(!)  Lymphocytes Absolute 1.40  Monocytes Absolute 0.90(!)  Eosinophils Absolute 0.20  Basophils Absolute 0.10  nRBC Absolute 0.01 Leukocytosis. [DT]  0451 Urinalysis with Reflex to Microscopic(!):   Color Yellow  Clarity, Urine Cloudy(!)  Specific Gravity, Urine 1.010  pH, Urine 6.0  Protein, Urine 30(!)  Glucose, Urine >=1000(!)  Ketones, Urine Negative  Bilirubin, Urine Negative  Blood, Urine Large(!)  Nitrite, Urine Positive(!)  Leukocytes Esterase, Urine Small(!)  Urobilinogen, Urine 0.2 Pyuria, bacteriuria - +nitrites. [DT]  0532 CT Abdomen Pelvis W IV Only IMPRESSION: 1. Sigmoidal diverticulosis without evidence of diverticulitis 2. Hepatomegaly with steatosis 3. Nonobstructing stone lower pole left kidney [DT]    ED Course User Index [DT] Daryl Darold Shlomo Raddle., DO   Medical Decision Making 58 year old female past medical history of sarcoidosis, Hashimoto's thyroid disease, diverticulitis, diabetes,  nephrolithiasis presents today for right flank pain which started yesterday.  Patient states symptoms are intermittent, and states the pain radiates from the flank to the lower abdomen.  Patient also endorses dysuria at this time.  On arrival, EMS gave patient Toradol at that time, states symptoms have improved.  Patient denies any fever, chills, chest pain, shortness of breath, vomiting, diarrhea, or any changes in bowel habits at this time.  Patient states symptoms feel similar to prior nephrolithiasis episode.  Patient did not require surgery for prior episode.  Physical Exam revealed vitals are within normal limits.  Heart regular rate and rhythm.  Lungs clear to auscultation bilaterally.  Abdomen soft, nontender without any evidence of distention.  No evidence of swelling.  2+ pulses throughout all extremities.  No evidence of CVA tenderness noted at this time.  Rest of physical exam as noted.  Labs ordered as followed: CBC, BMP, urinalysis.  Imaging ordered as followed: CT abdomen pelvis.  Medications give for symptomatic management: Zofran , IV fluids.  DDx: Nephrolithiasis, UTI, diverticulitis, bowel obstruction, bowel perforation.  Patient is resting company is no acute distress at this time.  Patient states symptoms have improved.  Independently reviewed labs evidence of leukocytosis, and significant UTI.  Independently reviewed imaging showed no evidence of any clinically significant acute findings at this time.  Patient presentation most likely represents pyelonephritis.  Patient was given antibiotics in the emergency department.  Discussed results at length with patient/patient family at this time, as well as for follow-up with primary care provider for further evaluation and management out patient.  Patient given prescription for Omnicef  at this time.  Patient also instructed to return for any new or worsening symptoms.  Disposition was discharge patient at this time.  Patient/patient family  understood and were agreeable to plan of care at this time.  Problems Addressed: Acute pyelonephritis: complicated acute illness or injury  Amount and/or Complexity of Data Reviewed Labs: ordered. Decision-making details documented in ED Course. Radiology: ordered. Decision-making details documented in ED Course.  Risk Prescription drug management.      ED Disposition:  Discharge Final diagnoses:  Acute pyelonephritis    ED Prescriptions     Medication Sig Dispense Start Date End Date Auth. Provider   [email or web address]  (OMNICEF ) 300 mg capsule Take 1 capsule (300 mg total) by mouth 2 (two) times a day for 7 days. 14 capsule 05/18/2024 05/25/2024 Daryl Darold Shlomo Raddle., DO             [1] History reviewed. No pertinent past medical history. [2] History reviewed. No pertinent surgical history. [3] No family history on file. [4] Social History Tobacco Use  . Smoking status: Never    Passive exposure: Never  . Smokeless tobacco: Never  Vaping Use  . Vaping status: Never Used  Substance Use Topics  . Alcohol use: No  . Drug use: Never    Comment: Drug use: Denies

## 2024-05-19 ENCOUNTER — Encounter (HOSPITAL_COMMUNITY): Payer: Self-pay | Admitting: Radiology

## 2024-05-19 ENCOUNTER — Encounter: Payer: Self-pay | Admitting: Physician Assistant

## 2024-05-20 ENCOUNTER — Encounter: Payer: Self-pay | Admitting: Physician Assistant

## 2024-05-20 ENCOUNTER — Ambulatory Visit: Admitting: Physician Assistant

## 2024-05-20 VITALS — BP 152/84 | HR 86 | Temp 98.0°F | Ht 70.6 in | Wt 296.0 lb

## 2024-05-20 DIAGNOSIS — E1165 Type 2 diabetes mellitus with hyperglycemia: Secondary | ICD-10-CM

## 2024-05-20 DIAGNOSIS — N200 Calculus of kidney: Secondary | ICD-10-CM | POA: Diagnosis not present

## 2024-05-20 DIAGNOSIS — R300 Dysuria: Secondary | ICD-10-CM | POA: Diagnosis not present

## 2024-05-20 DIAGNOSIS — R161 Splenomegaly, not elsewhere classified: Secondary | ICD-10-CM

## 2024-05-20 DIAGNOSIS — E782 Mixed hyperlipidemia: Secondary | ICD-10-CM | POA: Diagnosis not present

## 2024-05-20 DIAGNOSIS — I10 Essential (primary) hypertension: Secondary | ICD-10-CM

## 2024-05-20 DIAGNOSIS — K5790 Diverticulosis of intestine, part unspecified, without perforation or abscess without bleeding: Secondary | ICD-10-CM

## 2024-05-20 DIAGNOSIS — D869 Sarcoidosis, unspecified: Secondary | ICD-10-CM

## 2024-05-20 DIAGNOSIS — J321 Chronic frontal sinusitis: Secondary | ICD-10-CM

## 2024-05-20 DIAGNOSIS — E039 Hypothyroidism, unspecified: Secondary | ICD-10-CM

## 2024-05-20 LAB — POCT URINALYSIS DIP (CLINITEK)
Bilirubin, UA: NEGATIVE
Blood, UA: NEGATIVE
Glucose, UA: 100 mg/dL — AB
Ketones, POC UA: NEGATIVE mg/dL
Leukocytes, UA: NEGATIVE
Nitrite, UA: NEGATIVE
POC PROTEIN,UA: NEGATIVE
Spec Grav, UA: 1.01 (ref 1.010–1.025)
Urobilinogen, UA: NEGATIVE U/dL
pH, UA: 8 (ref 5.0–8.0)

## 2024-05-20 LAB — GLUCOSE, POCT (MANUAL RESULT ENTRY): POC Glucose: 108 mg/dL — AB (ref 70–99)

## 2024-05-20 MED ORDER — ONDANSETRON HCL 4 MG PO TABS: 4.0000 mg | ORAL_TABLET | Freq: Three times a day (TID) | ORAL | 0 refills | Status: DC | PRN

## 2024-05-20 MED ORDER — TAMSULOSIN HCL 0.4 MG PO CAPS: 0.4000 mg | ORAL_CAPSULE | Freq: Every day | ORAL | 3 refills | Status: AC

## 2024-05-20 NOTE — Progress Notes (Unsigned)
 Acute Office Visit  Subjective:    Patient ID: Holly [name], female    DOB: December 31, 1965, 58 y.o.   MRN: [id number]  Chief Complaint  Patient presents with   Abdominal Pain    HPI: Patient is in today for hospital follow up  Discussed the use of AI scribe software for clinical note transcription with the patient, who gave verbal consent to proceed.  History of Present Illness   Holly [name] is a 58 year old female with a history of urinary tract infections and kidney stones who presents with abdominal pain and urinary symptoms.  She recently visited the emergency department due to severe abdominal pain, initially suspecting a kidney stone. The pain was intense and localized to the right side, pelvic area, and back, but has since shifted to the left side as well. The pain on the right side has decreased, but new pain has developed on the left side. A CT scan performed at the hospital revealed no stone on the right side but did identify a stone on the left side of the kidney. She is currently taking Cefdinir  300 mg twice daily for a urinary tract infection, which was confirmed by a urine culture. There is some improvement in symptoms but she still experiences tenderness and burning during urination.  She has a history of sinus issues, which have worsened over the past three to four days, with symptoms hitting 'hard and fast.' She notes dark circles under her eyes, which she associates with sinus infections. She has a reduced immune system, which she feels contributes to the rapid onset of infections.  She also has a history of sarcoidosis, and a CT scan showed some collapse in the lower part of her lungs. She has not yet followed up with a pulmonologist. She is awaiting a cardiac stress test and has had an MRI with dye on her heart, though she has not received the results yet.  Her blood sugar levels have been slightly elevated, with morning readings over 100 and evening readings around 160.  She attributes some improvement to a recent shot she received.  She also reports a history of diverticulosis, which occasionally flares up, causing diarrhea and requiring a high-fiber diet outside of flare-ups.  She has experienced nausea and lightheadedness, with a history of taking Zofran  during pregnancy for similar symptoms. She reports a high pain threshold but notes that the current pain is significant. She has previously passed a kidney stone without significant intervention, but this episode has been more severe.       Past Medical History:  Diagnosis Date   Acute non-recurrent maxillary sinusitis 06/17/2020   Acute pain of left shoulder 09/12/2023   Allergic rhinitis 05/26/2019   Anxiety    At risk for obstructive sleep apnea 09/28/2020   BMI 40.0-44.9, adult (HCC) 08/02/2020   Cellulitis of foot, right 09/24/2023   Chronic sinusitis of both maxillary sinuses 12/08/2023   COVID-19 virus detected 12/30/2019   10/30/2019-SARS-CoV-2-positive Didn't require hospitalization     Depression    Diabetes mellitus without complication (HCC)    Diabetic glomerulopathy (HCC)    Diabetic polyneuropathy (HCC) 08/02/2020   Fatty liver    Fracture of intermediate cuneiform bone of right foot 09/16/2023   Generalized abdominal pain 01/09/2023   Goiter    Hashimoto's thyroiditis    Hilar adenopathy 03/20/19  04/11/19   CTA CHEST and PET per DR. VALARIA LEWIS   History of kidney stones    Hospital discharge follow-up  09/24/2023   Hypertension    Hypothyroidism (acquired) 08/02/2020   Insomnia 11/12/2022   Mediastinal adenopathy    PER CTA CHEST PET SCAN per DR. Socorro LEWIS   Mixed hyperlipidemia 08/02/2020   Moderate recurrent major depression (HCC) 07/09/2017   Morbid obesity (HCC) 08/02/2020   OSA (obstructive sleep apnea) 11/23/2020   Pericarditis 01/30/2024   Pharyngoesophageal dysphagia 01/09/2023   Pneumonia    Sarcoidosis 03/01/2019   PER PET SCAN per DR. Villa LEWIS      Severe recurrent major depression without psychotic features (HCC)    Strain of lumbar region 09/10/2022   Supraclavicular adenopathy 03/20/2019   PER CTA CHEST and PET per DR. ELEANORE LEWIS   Type 2 diabetes mellitus with hyperglycemia, without long-term current use of insulin (HCC) 08/10/2023   Urticaria due to drug allergy 11/26/2023   Vaginal yeast infection 07/31/2023   Vitamin D  deficiency 12/09/2022   Wound of left foot 01/30/2024    Past Surgical History:  Procedure Laterality Date   ABDOMINAL HYSTERECTOMY     COMPLETE   ADHESIOLYSIS     APPENDECTOMY     CESAREAN SECTION     MEDIASTINOSCOPY N/A 04/18/2019   Procedure: MEDIASTINOSCOPY;  Surgeon: Army Dallas NOVAK, MD;  Location: [facility] OR;  Service: Thoracic;  Laterality: N/A;   VIDEO BRONCHOSCOPY WITH ENDOBRONCHIAL ULTRASOUND N/A 04/18/2019   Procedure: VIDEO BRONCHOSCOPY WITH ENDOBRONCHIAL ULTRASOUND;  Surgeon: Army Dallas NOVAK, MD;  Location: [facility] OR;  Service: Thoracic;  Laterality: N/A;    Family History  Problem Relation Age of Onset   Breast cancer Mother    Anuerysm Mother        BRAIN   Cancer Mother        BREAST   COPD Father    Hyperlipidemia Father    Congestive Heart Failure Father    Bipolar disorder Sister    Diabetes Sister    Fibromyalgia Sister    Cancer Paternal Uncle        LUNG   Breast cancer Maternal Grandmother    Cancer Maternal Grandmother 68       BREAST   Cancer Paternal Grandfather        LUNG    Social History   Socioeconomic History   Marital status: Divorced    Spouse name: Not on file   Number of children: Not on file   Years of education: Not on file   Highest education level: Master's degree (e.g., MA, MS, MEng, MEd, MSW, MBA)  Occupational History   Not on file  Tobacco Use   Smoking status: Never   Smokeless tobacco: Never  Vaping Use   Vaping status: Never Used  Substance and Sexual Activity   Alcohol use: Yes    Comment: VERY RARE OCCASION   Drug use: Never    Sexual activity: Not Currently  Other Topics Concern   Not on file  Social History Narrative   Not on file   Social Drivers of Health   Financial Resource Strain: Medium Risk (09/30/2023)   Overall Financial Resource Strain (CARDIA)    Difficulty of Paying Living Expenses: Somewhat hard  Food Insecurity: Food Insecurity Present (09/30/2023)   Hunger Vital Sign    Worried About Running Out of Food in the Last Year: Sometimes true    Ran Out of Food in the Last Year: Never true  Transportation Needs: No Transportation Needs (09/30/2023)   PRAPARE - Transportation    Lack of Transportation (Medical): No    Lack of  Transportation (Non-Medical): No  Physical Activity: Insufficiently Active (09/30/2023)   Exercise Vital Sign    Days of Exercise per Week: 2 days    Minutes of Exercise per Session: 20 min  Stress: Stress Concern Present (09/30/2023)   [employer] of Occupational Health - Occupational Stress Questionnaire    Feeling of Stress : To some extent  Social Connections: Moderately Integrated (09/30/2023)   Social Connection and Isolation Panel    Frequency of Communication with Friends and Family: More than three times a week    Frequency of Social Gatherings with Friends and Family: Once a week    Attends Religious Services: More than 4 times per year    Active Member of [employer] or Organizations: Yes    Attends Banker Meetings: 1 to 4 times per year    Marital Status: Divorced  Intimate Partner Violence: Not At Risk (12/08/2022)   Humiliation, Afraid, Rape, and Kick questionnaire    Fear of Current or Ex-Partner: No    Emotionally Abused: No    Physically Abused: No    Sexually Abused: No    Outpatient Medications Prior to Visit  Medication Sig Dispense Refill   albuterol  (VENTOLIN  HFA) 108 (90 Base) MCG/ACT inhaler TAKE 2 PUFFS BY MOUTH EVERY 6 HOURS AS NEEDED FOR WHEEZE OR SHORTNESS OF BREATH 18 each 2   aspirin  EC 81 MG tablet Take 1 tablet (81 mg total)  by mouth daily. Swallow whole. 90 tablet 3   Bempedoic Acid-Ezetimibe (NEXLIZET ) 180-10 MG TABS Take 1 tablet by mouth daily. 30 tablet 1   budesonide -formoterol  (SYMBICORT ) 160-4.5 MCG/ACT inhaler Inhale 2 puffs into the lungs 2 (two) times daily. 1 each 3   buPROPion  (WELLBUTRIN  XL) 150 MG 24 hr tablet TAKE 1 TABLET BY MOUTH EVERY DAY 90 tablet 0   busPIRone  (BUSPAR ) 7.5 MG tablet TAKE 1 TABLET BY MOUTH TWICE A DAY 180 tablet 1   cefdinir  (OMNICEF ) 300 MG capsule Take 300 mg by mouth 2 (two) times daily.     clonazePAM  (KLONOPIN ) 0.5 MG tablet Take 1 tablet (0.5 mg total) by mouth 2 (two) times daily as needed for anxiety. 10 tablet 1   cyclobenzaprine  (FLEXERIL ) 5 MG tablet Take 1 tablet (5 mg total) by mouth 3 (three) times daily as needed for muscle spasms. 60 tablet 1   diltiazem  (CARDIZEM  CD) 120 MG 24 hr capsule TAKE 1 CAPSULE BY MOUTH EVERY DAY 90 capsule 1   famotidine  (PEPCID ) 40 MG tablet TAKE 1 TABLET BY MOUTH TWICE A DAY 180 tablet 1   FARXIGA  5 MG TABS tablet TAKE 1 TABLET BY MOUTH EVERY DAY BEFORE BREAKFAST 90 tablet 1   gabapentin  (NEURONTIN ) 300 MG capsule TAKE 2 CAPSULES BY MOUTH 2 TIMES DAILY. 360 capsule 0   levothyroxine  (SYNTHROID ) 25 MCG tablet Take 1 tablet (25 mcg total) by mouth daily before breakfast. 90 tablet 0   metFORMIN  (GLUCOPHAGE ) 1000 MG tablet TAKE 1 TABLET (1,000 MG TOTAL) BY MOUTH TWICE A DAY WITH FOOD 180 tablet 2   mometasone  (NASONEX ) 50 MCG/ACT nasal spray Place 2 sprays into the nose daily.     Multiple Vitamin (MULTI-VITAMIN) tablet Take 1 tablet by mouth daily.     omeprazole (PRILOSEC) 40 MG capsule TAKE 1 (ONE) CAPSULE BY MOUTH EVERY MORNING 30 MINUTES BEFORE BREAKFAST 90 capsule 1   rosuvastatin  (CRESTOR ) 10 MG tablet Take 1 tablet (10 mg total) by mouth daily. 90 tablet 3   SODIUM FLUORIDE 5000 PPM 1.1 % PSTE See  admin instructions.     tirzepatide  (MOUNJARO ) 7.5 MG/0.5ML Pen Inject 7.5 mg into the skin once a week. 6 mL 1   traZODone  (DESYREL )  150 MG tablet TAKE 1 TABLET BY MOUTH EVERYDAY AT BEDTIME 90 tablet 0   venlafaxine XR (EFFEXOR-XR) 75 MG 24 hr capsule TAKE 3 CAPSULES BY MOUTH DAILY IN THE MORNING 270 capsule 1   No facility-administered medications prior to visit.    Allergies  Allergen Reactions   Irbesartan  Hives   Moxifloxacin Anaphylaxis, Other (See Comments) and Hives   Avelox [Moxifloxacin Hcl In Nacl]    Cymbalta [Duloxetine Hcl]    Duloxetine Hives   Oxycodone-Acetaminophen Hives    Review of Systems  Constitutional:  Positive for fatigue. Negative for appetite change and fever.  HENT:  Negative for congestion, ear pain, sinus pressure and sore throat.   Respiratory:  Negative for cough, chest tightness, shortness of breath and wheezing.   Cardiovascular:  Negative for chest pain and palpitations.  Gastrointestinal:  Positive for abdominal pain. Negative for constipation, diarrhea, nausea and vomiting.  Genitourinary:  Positive for dysuria and pelvic pain. Negative for hematuria.  Musculoskeletal:  Negative for arthralgias, back pain, joint swelling and myalgias.  Skin:  Negative for rash.  Neurological:  Negative for dizziness, weakness and headaches.  Psychiatric/Behavioral:  Negative for dysphoric mood. The patient is not nervous/anxious.        Objective:        05/20/2024    2:11 PM 05/20/2024    1:30 PM 02/14/2024    8:44 AM  Vitals with BMI  Height  5' 10.6 5' 10.6  Weight  296 lbs 294 lbs 6 oz  BMI  41.76 41.54  Systolic 152 148 855  Diastolic 84 88 80  Pulse  86 77    Orthostatic VS for the past 72 hrs (Last 3 readings):  Patient Position BP Location  05/20/24 1411 Supine Right Arm  05/20/24 1330 Sitting Right Arm     Physical Exam Constitutional:      Appearance: She is ill-appearing and diaphoretic.  HENT:     Right Ear: Hearing, tympanic membrane and ear canal normal.     Left Ear: Hearing, tympanic membrane and ear canal normal.     Nose:     Right Sinus: Frontal  sinus tenderness present. No maxillary sinus tenderness.     Left Sinus: Frontal sinus tenderness present. No maxillary sinus tenderness.  Cardiovascular:     Rate and Rhythm: Normal rate and regular rhythm.     Heart sounds: Normal heart sounds. No murmur heard. Pulmonary:     Effort: Pulmonary effort is normal. No respiratory distress.     Breath sounds: Normal breath sounds.  Chest:     Chest wall: No tenderness.  Abdominal:     General: Abdomen is flat. Bowel sounds are normal. There is distension.     Palpations: There is splenomegaly. There is no hepatomegaly.     Tenderness: There is generalized abdominal tenderness. There is left CVA tenderness.  Neurological:     Mental Status: She is alert and oriented to person, place, and time.  Psychiatric:        Mood and Affect: Mood is anxious.     Health Maintenance Due  Topic Date Due   OPHTHALMOLOGY EXAM  Never done   HIV Screening  Never done   Hepatitis C Screening  Never done   Pneumococcal Vaccine 30-79 Years old (2 of 2 - PCV) 08/02/2021  Zoster Vaccines- Shingrix (2 of 2) 08/05/2021   COVID-19 Vaccine (4 - 2024-25 season) 06/24/2023    There are no preventive care reminders to display for this patient.   Lab Results  Component Value Date   TSH 3.820 05/20/2024   Lab Results  Component Value Date   WBC 10.8 05/20/2024   HGB 15.6 05/20/2024   HCT 48.5 (H) 05/20/2024   MCV 95 05/20/2024   PLT 331 05/20/2024   Lab Results  Component Value Date   NA 139 05/20/2024   K 5.1 05/20/2024   CO2 24 05/20/2024   GLUCOSE 87 05/20/2024   BUN 14 05/20/2024   CREATININE 0.73 05/20/2024   BILITOT 0.2 05/20/2024   ALKPHOS 101 05/20/2024   AST 34 05/20/2024   ALT 37 (H) 05/20/2024   PROT 7.6 05/20/2024   ALBUMIN 4.4 05/20/2024   CALCIUM  10.5 (H) 05/20/2024   ANIONGAP 11 04/16/2019   EGFR 96 05/20/2024   GFR 71.87 12/31/2019   Lab Results  Component Value Date   CHOL 157 05/20/2024   Lab Results  Component  Value Date   HDL 58 05/20/2024   Lab Results  Component Value Date   LDLCALC 73 05/20/2024   Lab Results  Component Value Date   TRIG 155 (H) 05/20/2024   Lab Results  Component Value Date   CHOLHDL 2.7 05/20/2024   Lab Results  Component Value Date   HGBA1C 6.2 (H) 05/20/2024       Assessment & Plan:  Dysuria Assessment & Plan: Elevated white blood cell count indicates infection. - Continue cefdinir  300 mg twice daily. - Order repeat blood count to assess white blood cell levels.  Orders: -     POCT URINALYSIS DIP (CLINITEK) -     Ondansetron  HCl; Take 1 tablet (4 mg total) by mouth every 8 (eight) hours as needed for nausea or vomiting.  Dispense: 20 tablet; Refill: 0  Nephrolithiasis Assessment & Plan: CT scan showed a 6 mm non-obstructing stone in the left lower pole. - Prescribe Zofran  for nausea. - Prescribe tamsulosin  (Flomax ) to aid in stone passage. - Consider urology referral if stone does not pass.  Orders: -     Hemoglobin A1c -     Comprehensive metabolic panel with GFR -     CBC with Differential/Platelet -     Tamsulosin  HCl; Take 1 capsule (0.4 mg total) by mouth daily.  Dispense: 30 capsule; Refill: 3  Primary hypertension Assessment & Plan: Elevated, possibly due to flank pain from stone moving Continue to monitor symptoms Will adjust BP medicine if it continues to stay elevated.   Mixed hyperlipidemia Assessment & Plan: Elevated cholesterol despite lifestyle modifications and previous trials of statins. Discussed potential for Repatha  (PCSK9 inhibitor) therapy. Continue to use Repatha  as prescribed Will adjust treatment based on labs  Orders: -     Lipid panel  Hypothyroidism (acquired) -     Lipid panel -     T4, free -     TSH  Type 2 diabetes mellitus with hyperglycemia, without long-term current use of insulin (HCC) Assessment & Plan: Blood sugar levels improved but not optimal. - Continue current diabetes management  regimen. Continue using Mounjaro  7.5mg , metformin  1000mg , Farxiga  5mg  Lab Results  Component Value Date   HGBA1C 6.2 (H) 05/20/2024   HGBA1C 7.1 (H) 11/29/2023   HGBA1C 10.0 (H) 07/31/2023     Orders: -     Microalbumin / creatinine urine ratio -     POCT  glucose (manual entry)  Diverticulosis Assessment & Plan: Known diverticulosis with occasional flares, currently no acute diverticulitis. - Advise high fiber diet to prevent flares. - Manage symptoms with fluids during flares.   Sarcoidosis Assessment & Plan: Recent CT findings of partial lung collapse, possibly related to sarcoidosis. - Follow up with pulmonologist. - Reschedule cardiac stress test.   Sinusitis chronic, frontal Assessment & Plan: Sinus symptoms suggest sinusitis; cefdinir  may not be fully effective. - Continue cefdinir . - Monitor sinus symptoms. - Consider probiotic yogurt to maintain gut flora.   Splenomegaly Assessment & Plan: Mild splenomegaly noted on CT, possibly related to infection. - Monitor for changes in spleen size and symptoms.     Meds ordered this encounter  Medications   ondansetron  (ZOFRAN ) 4 MG tablet    Sig: Take 1 tablet (4 mg total) by mouth every 8 (eight) hours as needed for nausea or vomiting.    Dispense:  20 tablet    Refill:  0   tamsulosin  (FLOMAX ) 0.4 MG CAPS capsule    Sig: Take 1 capsule (0.4 mg total) by mouth daily.    Dispense:  30 capsule    Refill:  3    Orders Placed This Encounter  Procedures   Hemoglobin A1c   Lipid panel   T4, free   TSH   Comprehensive metabolic panel with GFR   CBC with Differential/Platelet   Microalbumin / creatinine urine ratio   POCT URINALYSIS DIP (CLINITEK)   POCT glucose (manual entry)     Follow-up: No follow-ups on file.  An After Visit Summary was printed and given to the patient.    I,Lauren M Auman,acting as a Neurosurgeon for [employer], [employer].,have documented all relevant documentation on the behalf of Nola Angles, PA,as directed by  Nola Angles, PA while in the presence of Nola Angles, [place].    Nola Angles, [place] [facility] [phone]

## 2024-05-21 ENCOUNTER — Ambulatory Visit: Payer: Self-pay | Admitting: Physician Assistant

## 2024-05-21 ENCOUNTER — Inpatient Hospital Stay (HOSPITAL_COMMUNITY): Admission: RE | Admit: 2024-05-21 | Source: Ambulatory Visit

## 2024-05-21 ENCOUNTER — Encounter (HOSPITAL_COMMUNITY)

## 2024-05-21 DIAGNOSIS — J321 Chronic frontal sinusitis: Secondary | ICD-10-CM | POA: Insufficient documentation

## 2024-05-21 DIAGNOSIS — R300 Dysuria: Secondary | ICD-10-CM | POA: Insufficient documentation

## 2024-05-21 DIAGNOSIS — K5790 Diverticulosis of intestine, part unspecified, without perforation or abscess without bleeding: Secondary | ICD-10-CM | POA: Insufficient documentation

## 2024-05-21 DIAGNOSIS — N200 Calculus of kidney: Secondary | ICD-10-CM | POA: Insufficient documentation

## 2024-05-21 DIAGNOSIS — R161 Splenomegaly, not elsewhere classified: Secondary | ICD-10-CM | POA: Insufficient documentation

## 2024-05-21 LAB — COMPREHENSIVE METABOLIC PANEL WITH GFR
ALT: 37 IU/L — ABNORMAL HIGH (ref 0–32)
AST: 34 IU/L (ref 0–40)
Albumin: 4.4 g/dL (ref 3.8–4.9)
Alkaline Phosphatase: 101 IU/L (ref 44–121)
BUN/Creatinine Ratio: 19 (ref 9–23)
BUN: 14 mg/dL (ref 6–24)
Bilirubin Total: 0.2 mg/dL (ref 0.0–1.2)
CO2: 24 mmol/L (ref 20–29)
Calcium: 10.5 mg/dL — ABNORMAL HIGH (ref 8.7–10.2)
Chloride: 98 mmol/L (ref 96–106)
Creatinine, Ser: 0.73 mg/dL (ref 0.57–1.00)
Globulin, Total: 3.2 g/dL (ref 1.5–4.5)
Glucose: 87 mg/dL (ref 70–99)
Potassium: 5.1 mmol/L (ref 3.5–5.2)
Sodium: 139 mmol/L (ref 134–144)
Total Protein: 7.6 g/dL (ref 6.0–8.5)
eGFR: 96 mL/min/1.73 (ref >59)

## 2024-05-21 LAB — LIPID PANEL
Chol/HDL Ratio: 2.7 ratio (ref 0.0–4.4)
Cholesterol, Total: 157 mg/dL (ref 100–199)
HDL: 58 mg/dL (ref >39)
LDL Chol Calc (NIH): 73 mg/dL (ref 0–99)
Triglycerides: 155 mg/dL — ABNORMAL HIGH (ref 0–149)
VLDL Cholesterol Cal: 26 mg/dL (ref 5–40)

## 2024-05-21 LAB — CBC WITH DIFFERENTIAL/PLATELET
Basophils Absolute: 0.1 x10E3/uL (ref 0.0–0.2)
Basos: 1 %
EOS (ABSOLUTE): 0.3 x10E3/uL (ref 0.0–0.4)
Eos: 3 %
Hematocrit: 48.5 % — ABNORMAL HIGH (ref 34.0–46.6)
Hemoglobin: 15.6 g/dL (ref 11.1–15.9)
Immature Grans (Abs): 0.1 x10E3/uL (ref 0.0–0.1)
Immature Granulocytes: 1 %
Lymphocytes Absolute: 2.5 x10E3/uL (ref 0.7–3.1)
Lymphs: 23 %
MCH: 30.5 pg (ref 26.6–33.0)
MCHC: 32.2 g/dL (ref 31.5–35.7)
MCV: 95 fL (ref 79–97)
Monocytes Absolute: 0.7 x10E3/uL (ref 0.1–0.9)
Monocytes: 6 %
Neutrophils Absolute: 7.2 x10E3/uL — ABNORMAL HIGH (ref 1.4–7.0)
Neutrophils: 66 %
Platelets: 331 x10E3/uL (ref 150–450)
RBC: 5.11 x10E6/uL (ref 3.77–5.28)
RDW: 12.4 % (ref 11.7–15.4)
WBC: 10.8 x10E3/uL (ref 3.4–10.8)

## 2024-05-21 LAB — HEMOGLOBIN A1C
Est. average glucose Bld gHb Est-mCnc: 131 mg/dL
Hgb A1c MFr Bld: 6.2 % — ABNORMAL HIGH (ref 4.8–5.6)

## 2024-05-21 LAB — T4, FREE: Free T4: 0.89 ng/dL (ref 0.82–1.77)

## 2024-05-21 LAB — MICROALBUMIN / CREATININE URINE RATIO
Creatinine, Urine: 62.9 mg/dL
Microalb/Creat Ratio: 15 mg/g{creat} (ref 0–29)
Microalbumin, Urine: 9.2 ug/mL

## 2024-05-21 LAB — TSH: TSH: 3.82 u[IU]/mL (ref 0.450–4.500)

## 2024-05-21 MED ORDER — FUROSEMIDE 40 MG PO TABS: 40.0000 mg | ORAL_TABLET | Freq: Every day | ORAL | 0 refills | Status: DC

## 2024-05-21 NOTE — Assessment & Plan Note (Signed)
 Recent CT findings of partial lung collapse, possibly related to sarcoidosis. - Follow up with pulmonologist. - Reschedule cardiac stress test.

## 2024-05-21 NOTE — Assessment & Plan Note (Signed)
 Sinus symptoms suggest sinusitis; cefdinir  may not be fully effective. - Continue cefdinir . - Monitor sinus symptoms. - Consider probiotic yogurt to maintain gut flora.

## 2024-05-21 NOTE — Assessment & Plan Note (Signed)
 Blood sugar levels improved but not optimal. - Continue current diabetes management regimen. Continue using Mounjaro  7.5mg , metformin  1000mg , Farxiga  5mg  Lab Results  Component Value Date   HGBA1C 6.2 (H) 05/20/2024   HGBA1C 7.1 (H) 11/29/2023   HGBA1C 10.0 (H) 07/31/2023

## 2024-05-21 NOTE — Patient Instructions (Signed)
 VISIT SUMMARY:  During today's visit, we discussed your recent abdominal pain and urinary symptoms, which led to the discovery of a kidney stone on the left side. We also reviewed your ongoing urinary tract infection, sinus issues, and other health concerns including sarcoidosis, diabetes, and diverticulosis.  YOUR PLAN:  -URINARY TRACT INFECTION: A urinary tract infection (UTI) is an infection in any part of your urinary system. You should continue taking Cefdinir  300 mg twice daily. We will order a repeat blood count to check your white blood cell levels.  -LEFT NON-OBSTRUCTING KIDNEY STONE: A kidney stone is a hard deposit made of minerals and salts that forms in your kidneys. Your CT scan showed a 6 mm stone in the left lower part of your kidney. We will prescribe Zofran  for nausea and tamsulosin  (Flomax ) to help pass the stone. If the stone does not pass, we may refer you to a urologist.  -NAUSEA: Nausea is a feeling of sickness with an inclination to vomit. It is likely related to your kidney stone and possible infection. We will prescribe Zofran  to help manage this symptom.  -SUSPECTED SINUSITIS: Sinusitis is an inflammation or swelling of the tissue lining the sinuses. You should continue taking Cefdinir  and monitor your sinus symptoms. Consider eating probiotic yogurt to maintain gut health.  -DIVERTICULOSIS WITH RECURRENT DIVERTICULITIS: Diverticulosis is the presence of small, bulging pouches in your digestive tract. To prevent flare-ups, maintain a high-fiber diet and manage symptoms with fluids during flares.  -PULMONARY SARCOIDOSIS: Sarcoidosis is an inflammatory disease that affects multiple organs, particularly the lungs. Follow up with a pulmonologist and reschedule your cardiac stress test.  -TYPE 2 DIABETES MELLITUS: Type 2 diabetes is a condition that affects the way your body processes blood sugar. Continue with your current diabetes management regimen.  -MILD SPLENOMEGALY:  Mild splenomegaly is a slight enlargement of the spleen, possibly due to infection. We will monitor for any changes in spleen size and symptoms.  -GENERAL HEALTH MAINTENANCE: You are due for routine blood tests including A1c and lipid panel. We will order these tests to monitor your overall health.  INSTRUCTIONS:  Please follow up with a pulmonologist for your sarcoidosis and reschedule your cardiac stress test. Continue taking Cefdinir  for your UTI and monitor your symptoms. If your kidney stone does not pass, we may need to refer you to a urologist. We will also order a repeat blood count to check your white blood cell levels and routine blood tests including A1c and lipid panel.

## 2024-05-21 NOTE — Assessment & Plan Note (Signed)
 Elevated white blood cell count indicates infection. - Continue cefdinir  300 mg twice daily. - Order repeat blood count to assess white blood cell levels.

## 2024-05-21 NOTE — Assessment & Plan Note (Signed)
 Elevated cholesterol despite lifestyle modifications and previous trials of statins. Discussed potential for Repatha  (PCSK9 inhibitor) therapy. Continue to use Repatha  as prescribed Will adjust treatment based on labs

## 2024-05-21 NOTE — Assessment & Plan Note (Signed)
 Elevated, possibly due to flank pain from stone moving Continue to monitor symptoms Will adjust BP medicine if it continues to stay elevated.

## 2024-05-21 NOTE — Assessment & Plan Note (Signed)
 CT scan showed a 6 mm non-obstructing stone in the left lower pole. - Prescribe Zofran  for nausea. - Prescribe tamsulosin  (Flomax ) to aid in stone passage. - Consider urology referral if stone does not pass.

## 2024-05-21 NOTE — Assessment & Plan Note (Signed)
 Known diverticulosis with occasional flares, currently no acute diverticulitis. - Advise high fiber diet to prevent flares. - Manage symptoms with fluids during flares.

## 2024-05-21 NOTE — Assessment & Plan Note (Signed)
 Mild splenomegaly noted on CT, possibly related to infection. - Monitor for changes in spleen size and symptoms.

## 2024-05-22 ENCOUNTER — Inpatient Hospital Stay: Admitting: Physician Assistant

## 2024-05-24 ENCOUNTER — Other Ambulatory Visit: Payer: Self-pay | Admitting: Family Medicine

## 2024-05-26 ENCOUNTER — Telehealth (HOSPITAL_COMMUNITY): Payer: Self-pay

## 2024-05-26 NOTE — Telephone Encounter (Signed)
 Patient called and cancelled scheduled Myoview for 05/21/24. Patient states she is sick and will call back to reschedule. Order will be removed from the echo WQ and when pt calls we will reinstate the order. Thank you.

## 2024-05-27 ENCOUNTER — Other Ambulatory Visit: Payer: Self-pay | Admitting: Physician Assistant

## 2024-05-27 DIAGNOSIS — J321 Chronic frontal sinusitis: Secondary | ICD-10-CM

## 2024-05-27 MED ORDER — AZITHROMYCIN 250 MG PO TABS: ORAL_TABLET | ORAL | 0 refills | Status: AC

## 2024-05-27 MED ORDER — PREDNISONE 20 MG PO TABS: ORAL_TABLET | ORAL | 0 refills | Status: AC

## 2024-06-09 ENCOUNTER — Other Ambulatory Visit: Payer: Self-pay | Admitting: Physician Assistant

## 2024-06-09 DIAGNOSIS — F331 Major depressive disorder, recurrent, moderate: Secondary | ICD-10-CM

## 2024-06-16 ENCOUNTER — Other Ambulatory Visit: Payer: Self-pay | Admitting: Physician Assistant

## 2024-06-30 ENCOUNTER — Other Ambulatory Visit: Payer: Self-pay | Admitting: Physician Assistant

## 2024-06-30 DIAGNOSIS — E1165 Type 2 diabetes mellitus with hyperglycemia: Secondary | ICD-10-CM

## 2024-06-30 MED ORDER — TIRZEPATIDE 10 MG/0.5ML ~~LOC~~ SOAJ
10.0000 mg | SUBCUTANEOUS | 2 refills | Status: AC
Start: 2024-06-30 — End: ?

## 2024-07-19 ENCOUNTER — Encounter: Payer: Self-pay | Admitting: Physician Assistant

## 2024-07-21 ENCOUNTER — Other Ambulatory Visit: Payer: Self-pay | Admitting: Physician Assistant

## 2024-07-21 DIAGNOSIS — L659 Nonscarring hair loss, unspecified: Secondary | ICD-10-CM

## 2024-07-21 MED ORDER — MINOXIDIL FOR WOMEN 2 % EX SOLN: Freq: Two times a day (BID) | CUTANEOUS | 0 refills | Status: DC

## 2024-08-12 ENCOUNTER — Other Ambulatory Visit: Payer: Self-pay | Admitting: Physician Assistant

## 2024-08-12 DIAGNOSIS — I10 Essential (primary) hypertension: Secondary | ICD-10-CM

## 2024-08-14 ENCOUNTER — Ambulatory Visit: Admitting: Physician Assistant

## 2024-08-14 VITALS — BP 108/64 | HR 91 | Temp 98.2°F | Ht 70.5 in | Wt 285.0 lb

## 2024-08-14 DIAGNOSIS — J301 Allergic rhinitis due to pollen: Secondary | ICD-10-CM

## 2024-08-14 DIAGNOSIS — R051 Acute cough: Secondary | ICD-10-CM | POA: Diagnosis not present

## 2024-08-14 DIAGNOSIS — J208 Acute bronchitis due to other specified organisms: Secondary | ICD-10-CM

## 2024-08-14 LAB — POCT INFLUENZA A/B
Influenza A, POC: NEGATIVE
Influenza B, POC: NEGATIVE

## 2024-08-14 LAB — POCT RAPID STREP A (OFFICE): Rapid Strep A Screen: NEGATIVE

## 2024-08-14 LAB — POC COVID19 BINAXNOW: SARS Coronavirus 2 Ag: NEGATIVE

## 2024-08-14 MED ORDER — MOMETASONE FUROATE 50 MCG/ACT NA SUSP: 2.0000 | Freq: Every day | NASAL | 3 refills | Status: DC

## 2024-08-14 MED ORDER — ALBUTEROL SULFATE HFA 108 (90 BASE) MCG/ACT IN AERS: 2.0000 | INHALATION_SPRAY | Freq: Four times a day (QID) | RESPIRATORY_TRACT | 2 refills | Status: DC | PRN

## 2024-08-14 MED ORDER — GUAIFENESIN 100 MG/5ML PO LIQD: 5.0000 mL | ORAL | 0 refills | Status: DC | PRN

## 2024-08-14 MED ORDER — AMOXICILLIN-POT CLAVULANATE 875-125 MG PO TABS: 1.0000 | ORAL_TABLET | Freq: Two times a day (BID) | ORAL | 0 refills | Status: DC

## 2024-08-14 NOTE — Assessment & Plan Note (Addendum)
 Deviated nasal septum Deviated nasal septum causing difficulty breathing through the right nostril. History of nasal trauma. ENT evaluation suggested possible future surgical intervention. Orders:   mometasone  (NASONEX ) 50 MCG/ACT nasal spray; Place 2 sprays into the nose daily.

## 2024-08-14 NOTE — Progress Notes (Signed)
 Acute Office Visit  Subjective:    Patient ID: Holly [name], female    DOB: 18-Nov-1965, 58 y.o.   MRN: [id number]  Chief Complaint  Patient presents with   URI    Coughing, congested, shob, fatigue, headache, dizzy, sore throat x 3 weeks. Dayquil and benadryl  helps some.    HPI: Patient is in today for sinus congestion  Discussed the use of AI scribe software for clinical note transcription with the patient, who gave verbal consent to proceed.  History of Present Illness Holly [name] is a 58 year old female who presents with persistent cough and respiratory symptoms.  She has been experiencing symptoms for the past two weeks, initially improving but then worsening. She describes a persistent cough producing yellow-brown sputum and nasal discharge of the same color. She experiences pressure and intermittent ear pain, particularly on the left side, and feels out of breath with minimal exertion, such as walking around her classroom. No vomiting or diarrhea, but she reports nausea and cold sweats, suspecting a low-grade fever without having measured it.  She has a history of sinus infections, with a previous episode earlier in the spring. She tested negative for COVID-19 both at home and in the clinic. She mentions a deviated septum, identified by an ENT specialist, possibly due to a past accident in her teens, making it difficult to breathe through the right side of her nose.  Her current medications include duloxetine, amoxyfloxacin, irbesartan , and oxycodone. She has used Delsym and codeine -containing medications for cough relief in the past, which have been effective. She also mentions using mometasone  nasal spray for allergies and has not been using her Symbicort  inhaler recently.  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      Past Medical History:  Diagnosis  Date   Acute non-recurrent maxillary sinusitis 06/17/2020   Acute pain of left shoulder 09/12/2023   Allergic rhinitis 05/26/2019   Anxiety    At risk for obstructive sleep apnea 09/28/2020   BMI 40.0-44.9, adult (HCC) 08/02/2020   Cellulitis of foot, right 09/24/2023   Chronic sinusitis of both maxillary sinuses 12/08/2023   COVID-19 virus detected 12/30/2019   10/30/2019-SARS-CoV-2-positive Didn't require hospitalization     Depression    Diabetes mellitus without complication (HCC)    Diabetic glomerulopathy (HCC)    Diabetic polyneuropathy (HCC) 08/02/2020   Fatty liver    Fracture of intermediate cuneiform bone of right foot 09/16/2023   Generalized abdominal pain 01/09/2023   Goiter    Hashimoto's thyroiditis    Hilar adenopathy 03/20/19  04/11/19   CTA CHEST and PET per DR. VALARIA LEWIS   History of kidney stones    Hospital discharge follow-up 09/24/2023   Hypertension    Hypothyroidism (acquired) 08/02/2020   Insomnia 11/12/2022   Mediastinal adenopathy    PER CTA CHEST PET SCAN per DR. Socorro LEWIS   Mixed hyperlipidemia 08/02/2020   Moderate recurrent major depression (HCC) 07/09/2017   Morbid obesity (HCC) 08/02/2020   OSA (obstructive sleep apnea) 11/23/2020   Pericarditis 01/30/2024   Pharyngoesophageal dysphagia 01/09/2023   Pneumonia    Sarcoidosis 03/01/2019   PER PET SCAN per DR. Villa LEWIS     Severe recurrent major depression without psychotic features (HCC)    Strain of lumbar region 09/10/2022   Supraclavicular adenopathy 03/20/2019   PER CTA CHEST and PET per DR. ELEANORE LEWIS  Type 2 diabetes mellitus with hyperglycemia, without long-term current use of insulin (HCC) 08/10/2023   Urticaria due to drug allergy 11/26/2023   Vaginal yeast infection 07/31/2023   Vitamin D  deficiency 12/09/2022   Wound of left foot 01/30/2024    Past Surgical History:  Procedure Laterality Date   ABDOMINAL HYSTERECTOMY     COMPLETE   ADHESIOLYSIS      APPENDECTOMY     CESAREAN SECTION     MEDIASTINOSCOPY N/A 04/18/2019   Procedure: MEDIASTINOSCOPY;  Surgeon: Army Dallas NOVAK, MD;  Location: [facility] OR;  Service: Thoracic;  Laterality: N/A;   VIDEO BRONCHOSCOPY WITH ENDOBRONCHIAL ULTRASOUND N/A 04/18/2019   Procedure: VIDEO BRONCHOSCOPY WITH ENDOBRONCHIAL ULTRASOUND;  Surgeon: Army Dallas NOVAK, MD;  Location: [facility] OR;  Service: Thoracic;  Laterality: N/A;    Family History  Problem Relation Age of Onset   Breast cancer Mother    Anuerysm Mother        BRAIN   Cancer Mother        BREAST   COPD Father    Hyperlipidemia Father    Congestive Heart Failure Father    Bipolar disorder Sister    Diabetes Sister    Fibromyalgia Sister    Cancer Paternal Uncle        LUNG   Breast cancer Maternal Grandmother    Cancer Maternal Grandmother 39       BREAST   Cancer Paternal Grandfather        LUNG    Social History   Socioeconomic History   Marital status: Divorced    Spouse name: Not on file   Number of children: Not on file   Years of education: Not on file   Highest education level: Master's degree (e.g., MA, MS, MEng, MEd, MSW, MBA)  Occupational History   Not on file  Tobacco Use   Smoking status: Never   Smokeless tobacco: Never  Vaping Use   Vaping status: Never Used  Substance and Sexual Activity   Alcohol use: Yes    Comment: VERY RARE OCCASION   Drug use: Never   Sexual activity: Not Currently  Other Topics Concern   Not on file  Social History Narrative   Not on file   Social Drivers of Health   Financial Resource Strain: Medium Risk (09/30/2023)   Overall Financial Resource Strain (CARDIA)    Difficulty of Paying Living Expenses: Somewhat hard  Food Insecurity: Food Insecurity Present (09/30/2023)   Hunger Vital Sign    Worried About Running Out of Food in the Last Year: Sometimes true    Ran Out of Food in the Last Year: Never true  Transportation Needs: No Transportation Needs (09/30/2023)   PRAPARE -  Administrator, Civil Service (Medical): No    Lack of Transportation (Non-Medical): No  Physical Activity: Insufficiently Active (09/30/2023)   Exercise Vital Sign    Days of Exercise per Week: 2 days    Minutes of Exercise per Session: 20 min  Stress: Stress Concern Present (09/30/2023)   [employer] of Occupational Health - Occupational Stress Questionnaire    Feeling of Stress : To some extent  Social Connections: Moderately Integrated (09/30/2023)   Social Connection and Isolation Panel    Frequency of Communication with Friends and Family: More than three times a week    Frequency of Social Gatherings with Friends and Family: Once a week    Attends Religious Services: More than 4 times per year    Active  Member of Clubs or Organizations: Yes    Attends Banker Meetings: 1 to 4 times per year    Marital Status: Divorced  Intimate Partner Violence: Not At Risk (12/08/2022)   Humiliation, Afraid, Rape, and Kick questionnaire    Fear of Current or Ex-Partner: No    Emotionally Abused: No    Physically Abused: No    Sexually Abused: No    Outpatient Medications Prior to Visit  Medication Sig Dispense Refill   albuterol  (VENTOLIN  HFA) 108 (90 Base) MCG/ACT inhaler TAKE 2 PUFFS BY MOUTH EVERY 6 HOURS AS NEEDED FOR WHEEZE OR SHORTNESS OF BREATH 18 each 2   aspirin  EC 81 MG tablet Take 1 tablet (81 mg total) by mouth daily. Swallow whole. 90 tablet 3   Bempedoic Acid-Ezetimibe (NEXLIZET ) 180-10 MG TABS Take 1 tablet by mouth daily. 30 tablet 1   budesonide -formoterol  (SYMBICORT ) 160-4.5 MCG/ACT inhaler Inhale 2 puffs into the lungs 2 (two) times daily. 1 each 3   buPROPion  (WELLBUTRIN  XL) 150 MG 24 hr tablet TAKE 1 TABLET BY MOUTH EVERY DAY 90 tablet 0   busPIRone  (BUSPAR ) 7.5 MG tablet TAKE 1 TABLET BY MOUTH TWICE A DAY 180 tablet 1   clonazePAM  (KLONOPIN ) 0.5 MG tablet Take 1 tablet (0.5 mg total) by mouth 2 (two) times daily as needed for anxiety. 10 tablet  1   cyclobenzaprine  (FLEXERIL ) 5 MG tablet Take 1 tablet (5 mg total) by mouth 3 (three) times daily as needed for muscle spasms. 60 tablet 1   diltiazem  (CARDIZEM  CD) 120 MG 24 hr capsule TAKE 1 CAPSULE BY MOUTH EVERY DAY 90 capsule 1   famotidine  (PEPCID ) 40 MG tablet TAKE 1 TABLET BY MOUTH TWICE A DAY 180 tablet 1   FARXIGA  5 MG TABS tablet TAKE 1 TABLET BY MOUTH EVERY DAY BEFORE BREAKFAST 90 tablet 1   furosemide  (LASIX ) 40 MG tablet TAKE 1 TABLET BY MOUTH EVERY DAY 84 tablet 1   gabapentin  (NEURONTIN ) 300 MG capsule TAKE 2 CAPSULES BY MOUTH 2 TIMES DAILY. 360 capsule 0   levothyroxine  (SYNTHROID ) 25 MCG tablet Take 1 tablet (25 mcg total) by mouth daily before breakfast. 90 tablet 0   metFORMIN  (GLUCOPHAGE ) 1000 MG tablet TAKE 1 TABLET (1,000 MG TOTAL) BY MOUTH TWICE A DAY WITH FOOD 180 tablet 2   minoxidil (MINOXIDIL FOR WOMEN) 2 % external solution Apply topically 2 (two) times daily. 60 mL 0   mometasone  (NASONEX ) 50 MCG/ACT nasal spray Place 2 sprays into the nose daily.     Multiple Vitamin (MULTI-VITAMIN) tablet Take 1 tablet by mouth daily.     omeprazole (PRILOSEC) 40 MG capsule TAKE 1 (ONE) CAPSULE BY MOUTH EVERY MORNING 30 MINUTES BEFORE BREAKFAST 90 capsule 1   ondansetron  (ZOFRAN ) 4 MG tablet Take 1 tablet (4 mg total) by mouth every 8 (eight) hours as needed for nausea or vomiting. 20 tablet 0   rosuvastatin  (CRESTOR ) 10 MG tablet Take 1 tablet (10 mg total) by mouth daily. 90 tablet 3   SODIUM FLUORIDE 5000 PPM 1.1 % PSTE See admin instructions.     tamsulosin  (FLOMAX ) 0.4 MG CAPS capsule Take 1 capsule (0.4 mg total) by mouth daily. 30 capsule 3   tirzepatide  (MOUNJARO ) 10 MG/0.5ML Pen Inject 10 mg into the skin once a week. 6 mL 2   traZODone  (DESYREL ) 150 MG tablet TAKE 1 TABLET BY MOUTH EVERYDAY AT BEDTIME 90 tablet 0   venlafaxine XR (EFFEXOR-XR) 75 MG 24 hr capsule TAKE 3 CAPSULES  BY MOUTH DAILY IN THE MORNING 270 capsule 1   No facility-administered medications prior to  visit.    Allergies  Allergen Reactions   Irbesartan  Hives   Moxifloxacin Anaphylaxis, Other (See Comments) and Hives   Avelox [Moxifloxacin Hcl In Nacl]    Cymbalta [Duloxetine Hcl]    Duloxetine Hives   Oxycodone-Acetaminophen Hives    Review of Systems  Constitutional:  Negative for chills, fatigue and fever.  HENT:  Negative for congestion, ear pain and sore throat.   Respiratory:  Negative for cough and shortness of breath.   Cardiovascular:  Negative for chest pain.  Gastrointestinal:  Negative for abdominal pain, nausea and rectal pain.  Neurological:  Negative for headaches.       Objective:        08/14/2024    3:43 PM 05/20/2024    2:11 PM 05/20/2024    1:30 PM  Vitals with BMI  Height 5' 10.5  5' 10.6  Weight 285 lbs  296 lbs  BMI 40.3  41.76  Systolic  152 148  Diastolic  84 88  Pulse 91  86    No data found.   Physical Exam Vitals reviewed.  Constitutional:      Appearance: Normal appearance.  HENT:     Right Ear: Hearing normal. No drainage. A middle ear effusion is present. Tympanic membrane is erythematous and bulging.     Left Ear: Hearing normal. Drainage present. A middle ear effusion is present. Tympanic membrane is erythematous and bulging.  Neck:     Vascular: No carotid bruit.  Cardiovascular:     Rate and Rhythm: Normal rate and regular rhythm.     Heart sounds: Normal heart sounds.  Pulmonary:     Effort: Pulmonary effort is normal.     Breath sounds: Wheezing present.  Abdominal:     General: Bowel sounds are normal.     Palpations: Abdomen is soft.     Tenderness: There is no abdominal tenderness.  Neurological:     Mental Status: She is alert and oriented to person, place, and time.  Psychiatric:        Mood and Affect: Mood normal.        Behavior: Behavior normal.     Health Maintenance Due  Topic Date Due   OPHTHALMOLOGY EXAM  Never done   HIV Screening  Never done   Hepatitis C Screening  Never done    Pneumococcal Vaccine: 50+ Years (2 of 2 - PCV) 08/02/2021   Zoster Vaccines- Shingrix (2 of 2) 08/05/2021   COVID-19 Vaccine (4 - 2025-26 season) 06/23/2024    There are no preventive care reminders to display for this patient.   Lab Results  Component Value Date   TSH 3.820 05/20/2024   Lab Results  Component Value Date   WBC 10.8 05/20/2024   HGB 15.6 05/20/2024   HCT 48.5 (H) 05/20/2024   MCV 95 05/20/2024   PLT 331 05/20/2024   Lab Results  Component Value Date   NA 139 05/20/2024   K 5.1 05/20/2024   CO2 24 05/20/2024   GLUCOSE 87 05/20/2024   BUN 14 05/20/2024   CREATININE 0.73 05/20/2024   BILITOT 0.2 05/20/2024   ALKPHOS 101 05/20/2024   AST 34 05/20/2024   ALT 37 (H) 05/20/2024   PROT 7.6 05/20/2024   ALBUMIN 4.4 05/20/2024   CALCIUM  10.5 (H) 05/20/2024   ANIONGAP 11 04/16/2019   EGFR 96 05/20/2024   GFR 71.87 12/31/2019  Lab Results  Component Value Date   CHOL 157 05/20/2024   Lab Results  Component Value Date   HDL 58 05/20/2024   Lab Results  Component Value Date   LDLCALC 73 05/20/2024   Lab Results  Component Value Date   TRIG 155 (H) 05/20/2024   Lab Results  Component Value Date   CHOLHDL 2.7 05/20/2024   Lab Results  Component Value Date   HGBA1C 6.2 (H) 05/20/2024        Results for orders placed or performed in visit on 05/20/24  POCT URINALYSIS DIP (CLINITEK)   Collection Time: 05/20/24  1:34 PM  Result Value Ref Range   Color, UA     Clarity, UA     Glucose, UA =100 (A) negative mg/dL   Bilirubin, UA negative negative   Ketones, POC UA negative negative mg/dL   Spec Grav, UA 8.989 8.989 - 1.025   Blood, UA negative negative   pH, UA 8.0 5.0 - 8.0   POC PROTEIN,UA negative negative, trace   Urobilinogen, UA negative 0.2 or 1.0 E.U./dL   Nitrite, UA Negative Negative   Leukocytes, UA Negative Negative  Hemoglobin A1c   Collection Time: 05/20/24  2:10 PM  Result Value Ref Range   Hgb A1c MFr Bld 6.2 (H) 4.8 - 5.6  %   Est. average glucose Bld gHb Est-mCnc 131 mg/dL  Lipid panel   Collection Time: 05/20/24  2:10 PM  Result Value Ref Range   Cholesterol, Total 157 100 - 199 mg/dL   Triglycerides 844 (H) 0 - 149 mg/dL   HDL 58 >60 mg/dL   VLDL Cholesterol Cal 26 5 - 40 mg/dL   LDL Chol Calc (NIH) 73 0 - 99 mg/dL   Chol/HDL Ratio 2.7 0.0 - 4.4 ratio  T4, free   Collection Time: 05/20/24  2:10 PM  Result Value Ref Range   Free T4 0.89 0.82 - 1.77 ng/dL  TSH   Collection Time: 05/20/24  2:10 PM  Result Value Ref Range   TSH 3.820 0.450 - 4.500 uIU/mL  Comprehensive metabolic panel with GFR   Collection Time: 05/20/24  2:10 PM  Result Value Ref Range   Glucose 87 70 - 99 mg/dL   BUN 14 6 - 24 mg/dL   Creatinine, Ser 9.26 0.57 - 1.00 mg/dL   eGFR 96 >40 fO/fpw/8.26   BUN/Creatinine Ratio 19 9 - 23   Sodium 139 134 - 144 mmol/L   Potassium 5.1 3.5 - 5.2 mmol/L   Chloride 98 96 - 106 mmol/L   CO2 24 20 - 29 mmol/L   Calcium  10.5 (H) 8.7 - 10.2 mg/dL   Total Protein 7.6 6.0 - 8.5 g/dL   Albumin 4.4 3.8 - 4.9 g/dL   Globulin, Total 3.2 1.5 - 4.5 g/dL   Bilirubin Total 0.2 0.0 - 1.2 mg/dL   Alkaline Phosphatase 101 44 - 121 IU/L   AST 34 0 - 40 IU/L   ALT 37 (H) 0 - 32 IU/L  CBC with Differential/Platelet   Collection Time: 05/20/24  2:10 PM  Result Value Ref Range   WBC 10.8 3.4 - 10.8 x10E3/uL   RBC 5.11 3.77 - 5.28 x10E6/uL   Hemoglobin 15.6 11.1 - 15.9 g/dL   Hematocrit 51.4 (H) 65.9 - 46.6 %   MCV 95 79 - 97 fL   MCH 30.5 26.6 - 33.0 pg   MCHC 32.2 31.5 - 35.7 g/dL   RDW 87.5 88.2 - 84.5 %   Platelets  331 150 - 450 x10E3/uL   Neutrophils 66 Not Estab. %   Lymphs 23 Not Estab. %   Monocytes 6 Not Estab. %   Eos 3 Not Estab. %   Basos 1 Not Estab. %   Neutrophils Absolute 7.2 (H) 1.4 - 7.0 x10E3/uL   Lymphocytes Absolute 2.5 0.7 - 3.1 x10E3/uL   Monocytes Absolute 0.7 0.1 - 0.9 x10E3/uL   EOS (ABSOLUTE) 0.3 0.0 - 0.4 x10E3/uL   Basophils Absolute 0.1 0.0 - 0.2 x10E3/uL    Immature Granulocytes 1 Not Estab. %   Immature Grans (Abs) 0.1 0.0 - 0.1 x10E3/uL  Microalbumin / creatinine urine ratio   Collection Time: 05/20/24  3:04 PM  Result Value Ref Range   Creatinine, Urine 62.9 Not Estab. mg/dL   Microalbumin, Urine 9.2 Not Estab. ug/mL   Microalb/Creat Ratio 15 0 - 29 mg/g creat  POCT glucose (manual entry)   Collection Time: 05/20/24  3:14 PM  Result Value Ref Range   POC Glucose 108 (A) 70 - 99 mg/dl     Assessment & Plan:   Assessment & Plan Acute cough Acute sinusitis with persistent productive cough Sinusitis with possible bronchitis due to persistent cough and sputum production. Negative COVID-19 test. Differential includes bronchitis and potential progression to pneumonia. - Prescribed Augmentin  for 7 days. - Provided albuterol  inhaler. - Recommended Delsym for daytime cough suppression. - Advised use of Symbicort  instead of albuterol . - Refilled mometasone  nasal spray. - Encouraged rest and hydration with electrolyte-rich fluids. - Advised monitoring for fever or worsening symptoms. - Plan to reassess by Tuesday next week; consider chest x-ray and Rocephin  if no improvement. Orders:   POCT rapid strep A   POCT Influenza A/B   POC COVID-19 BinaxNow   guaiFENesin  (ROBITUSSIN) 100 MG/5ML liquid; Take 5 mLs by mouth every 4 (four) hours as needed for cough or to loosen phlegm.  Acute bronchitis due to other specified organisms Continue to monitor symptoms Will send initial antibiotic If no improvement will send for x-ray and second antibiotic Orders:   amoxicillin -clavulanate (AUGMENTIN ) 875-125 MG tablet; Take 1 tablet by mouth 2 (two) times daily.  Allergic rhinitis due to pollen, unspecified seasonality Deviated nasal septum Deviated nasal septum causing difficulty breathing through the right nostril. History of nasal trauma. ENT evaluation suggested possible future surgical intervention. Orders:   mometasone  (NASONEX ) 50 MCG/ACT  nasal spray; Place 2 sprays into the nose daily.     Body mass index is 40.32 kg/m.SABRA   No orders of the defined types were placed in this encounter.   No orders of the defined types were placed in this encounter.    Follow-up: No follow-ups on file.  An After Visit Summary was printed and given to the patient.  Holly [name], [place] [facility] [phone]

## 2024-08-14 NOTE — Assessment & Plan Note (Addendum)
 Acute sinusitis with persistent productive cough Sinusitis with possible bronchitis due to persistent cough and sputum production. Negative COVID-19 test. Differential includes bronchitis and potential progression to pneumonia. - Prescribed Augmentin  for 7 days. - Provided albuterol  inhaler. - Recommended Delsym for daytime cough suppression. - Advised use of Symbicort  instead of albuterol . - Refilled mometasone  nasal spray. - Encouraged rest and hydration with electrolyte-rich fluids. - Advised monitoring for fever or worsening symptoms. - Plan to reassess by Tuesday next week; consider chest x-ray and Rocephin  if no improvement. Orders:   POCT rapid strep A   POCT Influenza A/B   POC COVID-19 BinaxNow   guaiFENesin  (ROBITUSSIN) 100 MG/5ML liquid; Take 5 mLs by mouth every 4 (four) hours as needed for cough or to loosen phlegm.

## 2024-08-14 NOTE — Assessment & Plan Note (Addendum)
 Continue to monitor symptoms Will send initial antibiotic If no improvement will send for x-ray and second antibiotic Orders:   amoxicillin -clavulanate (AUGMENTIN ) 875-125 MG tablet; Take 1 tablet by mouth 2 (two) times daily.

## 2024-08-18 ENCOUNTER — Encounter: Payer: Self-pay | Admitting: Physician Assistant

## 2024-08-20 ENCOUNTER — Other Ambulatory Visit: Payer: Self-pay | Admitting: Physician Assistant

## 2024-08-20 ENCOUNTER — Encounter: Payer: Self-pay | Admitting: Physician Assistant

## 2024-08-20 DIAGNOSIS — R051 Acute cough: Secondary | ICD-10-CM

## 2024-08-20 MED ORDER — DOXYCYCLINE HYCLATE 100 MG PO TABS: 100.0000 mg | ORAL_TABLET | Freq: Two times a day (BID) | ORAL | 0 refills | Status: DC

## 2024-08-20 MED ORDER — PREDNISONE 20 MG PO TABS: ORAL_TABLET | ORAL | 0 refills | Status: AC

## 2024-08-22 ENCOUNTER — Other Ambulatory Visit: Payer: Self-pay

## 2024-08-22 MED ORDER — FLUCONAZOLE 150 MG PO TABS: 150.0000 mg | ORAL_TABLET | Freq: Every day | ORAL | 0 refills | Status: DC

## 2024-08-22 NOTE — Telephone Encounter (Signed)
 Ok to send per [place]. Rx sent.

## 2024-08-31 ENCOUNTER — Other Ambulatory Visit: Payer: Self-pay | Admitting: Physician Assistant

## 2024-08-31 ENCOUNTER — Other Ambulatory Visit: Payer: Self-pay | Admitting: Family Medicine

## 2024-08-31 DIAGNOSIS — E782 Mixed hyperlipidemia: Secondary | ICD-10-CM

## 2024-08-31 DIAGNOSIS — R42 Dizziness and giddiness: Secondary | ICD-10-CM

## 2024-08-31 DIAGNOSIS — R1084 Generalized abdominal pain: Secondary | ICD-10-CM

## 2024-08-31 DIAGNOSIS — E1142 Type 2 diabetes mellitus with diabetic polyneuropathy: Secondary | ICD-10-CM

## 2024-09-04 ENCOUNTER — Telehealth: Payer: Self-pay

## 2024-09-04 NOTE — Telephone Encounter (Signed)
PA submitted and approved via covermymeds for rybelsus.

## 2024-09-08 ENCOUNTER — Encounter: Payer: Self-pay | Admitting: Physician Assistant

## 2024-09-08 NOTE — Telephone Encounter (Signed)
 Spoke with patient, she stated she does has diabetes, does have some numbness, when she feels pain she knows something is not right. Patient stated she did fall in the bath tub about 3 weeks ago and her right 2nd and 3rd toes has been hurting since. She just wants to makes sure everything is okay and is not diabetic related. Patient stated that she would need a 1600 appointment time and Wednesday is the first available for that time. Patient is okay with that day and has no questions or concerns at this time.

## 2024-09-09 NOTE — Progress Notes (Unsigned)
 Acute Office Visit  Subjective:    Patient ID: Holly [name], female    DOB: 1965/11/22, 58 y.o.   MRN: [id number]  Chief Complaint  Patient presents with   Foot pain    Right foot pain, fell in bath tub 3 weeks ago. Some lower back pain-constant-achy/dull does not radiate.     Discussed the use of AI scribe software for clinical note transcription with the patient, who gave verbal consent to proceed.  History of Present Illness Holly [name] is a 58 year old female who presents with back and foot pain following a fall.  Lumbar and left-sided back pain - Onset three weeks ago following a fall in the bathtub - Pain localized to the left side of the back, radiating upwards - Described as a knot - No radiation of pain down the legs - Chiropractic treatment twice, with improvement in other areas but persistent pain at the affected site - Uses Advil  (2-4 pills as needed, approximately every other day) and ice for relief - Previous use of muscle relaxants (Flexeril ) with good effect  Right foot pain and swelling - Pain and swelling in the right foot since the fall - Pain localized to the toes and knuckle area - Able to perceive pain despite underlying neuropathy - Aching worsens after prolonged standing or activity - Intermittent color change in one toe, appearing darker at times - No evaluation by a healthcare provider since onset  Headache and sinus congestion - Frequent headaches attributed to sinus issues and tension in the back - Earaches and nasal congestion present - No fevers, chills, or sweats - Using Nasonex  for sinus symptoms, prescribed two weeks ago  Depressive and anxiety symptoms - Significant stress and anxiety related to work, financial concerns, and bereavement (father's passing) - Current symptoms include insomnia, fatigue, overeating, and difficulty concentrating - History of depression and anxiety, with prior hospitalization for mental health crisis 12-13  years ago - Recent suicidal ideation, but motivated by her sons - Engaged in online therapy, finding sessions helpful - Current medications: venlafaxine, trazodone  (for sleep), Wellbutrin , buspirone ; previous use of clonazepam     Past Medical History:  Diagnosis Date   Acute non-recurrent maxillary sinusitis 06/17/2020   Acute pain of left shoulder 09/12/2023   Allergic rhinitis 05/26/2019   Anxiety    At risk for obstructive sleep apnea 09/28/2020   BMI 40.0-44.9, adult (HCC) 08/02/2020   Cellulitis of foot, right 09/24/2023   Chronic sinusitis of both maxillary sinuses 12/08/2023   COVID-19 virus detected 12/30/2019   10/30/2019-SARS-CoV-2-positive Didn't require hospitalization     Depression    Diabetes mellitus without complication (HCC)    Diabetic glomerulopathy (HCC)    Diabetic polyneuropathy (HCC) 08/02/2020   Fatty liver    Fracture of intermediate cuneiform bone of right foot 09/16/2023   Generalized abdominal pain 01/09/2023   Goiter    Hashimoto's thyroiditis    Hilar adenopathy 03/20/19  04/11/19   CTA CHEST and PET per DR. VALARIA LEWIS   History of kidney stones    Hospital discharge follow-up 09/24/2023   Hypertension    Hypothyroidism (acquired) 08/02/2020   Insomnia 11/12/2022   Mediastinal adenopathy    PER CTA CHEST PET SCAN per DR. Socorro LEWIS   Mixed hyperlipidemia 08/02/2020   Moderate recurrent major depression (HCC) 07/09/2017   Morbid obesity (HCC) 08/02/2020   OSA (obstructive sleep apnea) 11/23/2020   Pericarditis 01/30/2024   Pharyngoesophageal dysphagia 01/09/2023   Pneumonia    Sarcoidosis  03/01/2019   PER PET SCAN per DR. Villa LEWIS     Severe recurrent major depression without psychotic features (HCC)    Strain of lumbar region 09/10/2022   Supraclavicular adenopathy 03/20/2019   PER CTA CHEST and PET per DR. ELEANORE LEWIS   Type 2 diabetes mellitus with hyperglycemia, without long-term current use of insulin (HCC) 08/10/2023    Urticaria due to drug allergy 11/26/2023   Vaginal yeast infection 07/31/2023   Vitamin D  deficiency 12/09/2022   Wound of left foot 01/30/2024    Past Surgical History:  Procedure Laterality Date   ABDOMINAL HYSTERECTOMY     COMPLETE   ADHESIOLYSIS     APPENDECTOMY     CESAREAN SECTION     MEDIASTINOSCOPY N/A 04/18/2019   Procedure: MEDIASTINOSCOPY;  Surgeon: Army Dallas NOVAK, MD;  Location: [facility] OR;  Service: Thoracic;  Laterality: N/A;   VIDEO BRONCHOSCOPY WITH ENDOBRONCHIAL ULTRASOUND N/A 04/18/2019   Procedure: VIDEO BRONCHOSCOPY WITH ENDOBRONCHIAL ULTRASOUND;  Surgeon: Army Dallas NOVAK, MD;  Location: [facility] OR;  Service: Thoracic;  Laterality: N/A;    Family History  Problem Relation Age of Onset   Breast cancer Mother    Anuerysm Mother        BRAIN   Cancer Mother        BREAST   COPD Father    Hyperlipidemia Father    Congestive Heart Failure Father    Bipolar disorder Sister    Diabetes Sister    Fibromyalgia Sister    Cancer Paternal Uncle        LUNG   Breast cancer Maternal Grandmother    Cancer Maternal Grandmother 31       BREAST   Cancer Paternal Grandfather        LUNG    Social History   Socioeconomic History   Marital status: Divorced    Spouse name: Not on file   Number of children: Not on file   Years of education: Not on file   Highest education level: Master's degree (e.g., MA, MS, MEng, MEd, MSW, MBA)  Occupational History   Not on file  Tobacco Use   Smoking status: Never   Smokeless tobacco: Never  Vaping Use   Vaping status: Never Used  Substance and Sexual Activity   Alcohol use: Yes    Comment: VERY RARE OCCASION   Drug use: Never   Sexual activity: Not Currently  Other Topics Concern   Not on file  Social History Narrative   Not on file   Social Drivers of Health   Financial Resource Strain: Medium Risk (09/30/2023)   Overall Financial Resource Strain (CARDIA)    Difficulty of Paying Living Expenses: Somewhat hard  Food  Insecurity: No Food Insecurity (09/10/2024)   Hunger Vital Sign    Worried About Running Out of Food in the Last Year: Never true    Ran Out of Food in the Last Year: Never true  Transportation Needs: No Transportation Needs (09/10/2024)   PRAPARE - Administrator, Civil Service (Medical): No    Lack of Transportation (Non-Medical): No  Physical Activity: Insufficiently Active (09/30/2023)   Exercise Vital Sign    Days of Exercise per Week: 2 days    Minutes of Exercise per Session: 20 min  Stress: Stress Concern Present (09/30/2023)   [employer] of Occupational Health - Occupational Stress Questionnaire    Feeling of Stress : To some extent  Social Connections: Moderately Integrated (09/30/2023)   Social Connection and  Isolation Panel    Frequency of Communication with Friends and Family: More than three times a week    Frequency of Social Gatherings with Friends and Family: Once a week    Attends Religious Services: More than 4 times per year    Active Member of [employer] or Organizations: Yes    Attends Banker Meetings: 1 to 4 times per year    Marital Status: Divorced  Catering Manager Violence: Not At Risk (09/10/2024)   Humiliation, Afraid, Rape, and Kick questionnaire    Fear of Current or Ex-Partner: No    Emotionally Abused: No    Physically Abused: No    Sexually Abused: No    Outpatient Medications Prior to Visit  Medication Sig Dispense Refill   aspirin  EC 81 MG tablet Take 1 tablet (81 mg total) by mouth daily. Swallow whole. 90 tablet 3   budesonide -formoterol  (SYMBICORT ) 160-4.5 MCG/ACT inhaler Inhale 2 puffs into the lungs 2 (two) times daily. 1 each 3   busPIRone  (BUSPAR ) 7.5 MG tablet TAKE 1 TABLET BY MOUTH TWICE A DAY 180 tablet 1   diltiazem  (CARDIZEM  CD) 120 MG 24 hr capsule TAKE 1 CAPSULE BY MOUTH EVERY DAY 90 capsule 1   doxycycline  (VIBRA -TABS) 100 MG tablet Take 1 tablet (100 mg total) by mouth 2 (two) times daily. 14 tablet 0    famotidine  (PEPCID ) 40 MG tablet TAKE 1 TABLET BY MOUTH TWICE A DAY 180 tablet 0   FARXIGA  5 MG TABS tablet TAKE 1 TABLET BY MOUTH EVERY DAY BEFORE BREAKFAST 90 tablet 1   furosemide  (LASIX ) 40 MG tablet TAKE 1 TABLET BY MOUTH EVERY DAY 84 tablet 1   gabapentin  (NEURONTIN ) 300 MG capsule TAKE 2 CAPSULES BY MOUTH TWICE A DAY 360 capsule 0   levothyroxine  (SYNTHROID ) 25 MCG tablet Take 1 tablet (25 mcg total) by mouth daily before breakfast. 90 tablet 0   mometasone  (NASONEX ) 50 MCG/ACT nasal spray Place 2 sprays into the nose daily. 1 each 3   Multiple Vitamin (MULTI-VITAMIN) tablet Take 1 tablet by mouth daily.     NEXLIZET  180-10 MG TABS TAKE 1 TABLET BY MOUTH EVERY DAY 30 tablet 1   omeprazole (PRILOSEC) 40 MG capsule TAKE 1 (ONE) CAPSULE BY MOUTH EVERY MORNING 30 MINUTES BEFORE BREAKFAST 90 capsule 1   rosuvastatin  (CRESTOR ) 10 MG tablet Take 1 tablet (10 mg total) by mouth daily. 90 tablet 3   SODIUM FLUORIDE 5000 PPM 1.1 % PSTE See admin instructions.     tamsulosin  (FLOMAX ) 0.4 MG CAPS capsule Take 1 capsule (0.4 mg total) by mouth daily. 30 capsule 3   tirzepatide  (MOUNJARO ) 10 MG/0.5ML Pen Inject 10 mg into the skin once a week. 6 mL 2   traZODone  (DESYREL ) 150 MG tablet TAKE 1 TABLET BY MOUTH EVERYDAY AT BEDTIME 90 tablet 0   venlafaxine XR (EFFEXOR-XR) 75 MG 24 hr capsule TAKE 3 CAPSULES BY MOUTH DAILY IN THE MORNING 270 capsule 1   Vitamin D , Ergocalciferol , (DRISDOL ) 1.25 MG (50000 UNIT) CAPS capsule TAKE 1 CAPSULE (50,000 UNITS TOTAL) BY MOUTH EVERY 7 (SEVEN) DAYS 5 capsule 2   amoxicillin -clavulanate (AUGMENTIN ) 875-125 MG tablet Take 1 tablet by mouth 2 (two) times daily. 14 tablet 0   buPROPion  (WELLBUTRIN  XL) 150 MG 24 hr tablet TAKE 1 TABLET BY MOUTH EVERY DAY 90 tablet 0   clonazePAM  (KLONOPIN ) 0.5 MG tablet Take 1 tablet (0.5 mg total) by mouth 2 (two) times daily as needed for anxiety. 10 tablet 1  cyclobenzaprine  (FLEXERIL ) 5 MG tablet Take 1 tablet (5 mg total) by mouth 3  (three) times daily as needed for muscle spasms. 60 tablet 1   fluconazole  (DIFLUCAN ) 150 MG tablet Take 1 tablet (150 mg total) by mouth daily. 1 tablet 0   guaiFENesin  (ROBITUSSIN) 100 MG/5ML liquid Take 5 mLs by mouth every 4 (four) hours as needed for cough or to loosen phlegm. 120 mL 0   metFORMIN  (GLUCOPHAGE ) 1000 MG tablet TAKE 1 TABLET (1,000 MG TOTAL) BY MOUTH TWICE A DAY WITH FOOD 180 tablet 2   minoxidil  (MINOXIDIL  FOR WOMEN) 2 % external solution Apply topically 2 (two) times daily. 60 mL 0   ondansetron  (ZOFRAN ) 4 MG tablet Take 1 tablet (4 mg total) by mouth every 8 (eight) hours as needed for nausea or vomiting. 20 tablet 0   Semaglutide  (RYBELSUS ) 7 MG TABS TAKE 1 TABLET (7 MG TOTAL) BY MOUTH DAILY 90 tablet 0   No facility-administered medications prior to visit.    Allergies  Allergen Reactions   Irbesartan  Hives   Moxifloxacin Anaphylaxis, Other (See Comments) and Hives   Avelox [Moxifloxacin Hcl In Nacl]    Cymbalta [Duloxetine Hcl]    Duloxetine Hives   Oxycodone-Acetaminophen Hives    Review of Systems     Objective:        09/10/2024    4:06 PM 08/14/2024    3:43 PM 05/20/2024    2:11 PM  Vitals with BMI  Height 5' 10.5 5' 10.5   Weight 289 lbs 285 lbs   BMI 40.87 40.3   Systolic 134 108 847  Diastolic 70 64 84  Pulse 93 91     No data found.   Physical Exam Vitals reviewed.  Constitutional:      Appearance: Normal appearance.  HENT:     Right Ear: Tympanic membrane, ear canal and external ear normal.     Left Ear: Tympanic membrane, ear canal and external ear normal.     Nose: Congestion present.     Mouth/Throat:     Pharynx: Oropharynx is clear.  Cardiovascular:     Rate and Rhythm: Normal rate and regular rhythm.     Heart sounds: Normal heart sounds. No murmur heard. Pulmonary:     Effort: Pulmonary effort is normal. No respiratory distress.     Breath sounds: Normal breath sounds.  Musculoskeletal:        General: Tenderness  (UPPER LUMBAR MORE TO THE RIGHT. TENDER OVER BOTTOM OF RT FOOT AND SECOND TOE OF RT FOOT.) present.  Lymphadenopathy:     Cervical: No cervical adenopathy.  Neurological:     Mental Status: She is alert and oriented to person, place, and time.  Psychiatric:        Behavior: Behavior normal.     Comments: DEPRESSED.     Health Maintenance Due  Topic Date Due   OPHTHALMOLOGY EXAM  Never done   HIV Screening  Never done   Hepatitis C Screening  Never done   Pneumococcal Vaccine: 50+ Years (2 of 2 - PCV) 08/02/2021   Zoster Vaccines- Shingrix (2 of 2) 08/05/2021   COVID-19 Vaccine (4 - 2025-26 season) 06/23/2024    There are no preventive care reminders to display for this patient.   Lab Results  Component Value Date   TSH 3.820 05/20/2024   Lab Results  Component Value Date   WBC 10.8 05/20/2024   HGB 15.6 05/20/2024   HCT 48.5 (H) 05/20/2024   MCV 95  05/20/2024   PLT 331 05/20/2024   Lab Results  Component Value Date   NA 139 05/20/2024   K 5.1 05/20/2024   CO2 24 05/20/2024   GLUCOSE 87 05/20/2024   BUN 14 05/20/2024   CREATININE 0.73 05/20/2024   BILITOT 0.2 05/20/2024   ALKPHOS 101 05/20/2024   AST 34 05/20/2024   ALT 37 (H) 05/20/2024   PROT 7.6 05/20/2024   ALBUMIN 4.4 05/20/2024   CALCIUM  10.5 (H) 05/20/2024   ANIONGAP 11 04/16/2019   EGFR 96 05/20/2024   GFR 71.87 12/31/2019   Lab Results  Component Value Date   CHOL 157 05/20/2024   Lab Results  Component Value Date   HDL 58 05/20/2024   Lab Results  Component Value Date   LDLCALC 73 05/20/2024   Lab Results  Component Value Date   TRIG 155 (H) 05/20/2024   Lab Results  Component Value Date   CHOLHDL 2.7 05/20/2024   Lab Results  Component Value Date   HGBA1C 6.2 (H) 05/20/2024        Results for orders placed or performed in visit on 08/14/24  POCT rapid strep A   Collection Time: 08/14/24  3:57 PM  Result Value Ref Range   Rapid Strep A Screen Negative Negative  POCT  Influenza A/B   Collection Time: 08/14/24  4:09 PM  Result Value Ref Range   Influenza A, POC Negative Negative   Influenza B, POC Negative Negative  POC COVID-19 BinaxNow   Collection Time: 08/14/24  4:09 PM  Result Value Ref Range   SARS Coronavirus 2 Ag Negative Negative     Assessment & Plan:   Assessment & Plan Moderate recurrent major depression (HCC) Depression with recent suicidal ideation due to life stressors. On multiple medications. No current suicidal plan but history of suicidal thoughts and past hospitalization. - Increased Wellbutrin  XL to 300 mg in the morning. - Referred to psychiatry for further evaluation and management of depression and suicidal ideation. - Provided information on suicidal crisis lines and after-hours support. - Encouraged continued therapy sessions with her counselor. - Advised to ensure no firearms are accessible at home to prevent any potential harm. Orders:   buPROPion  (WELLBUTRIN  XL) 300 MG 24 hr tablet; Take 1 tablet (300 mg total) by mouth daily.  Right foot pain Swelling and discoloration suggest possible fracture or bruising. Neuropathy may obscure pain assessment. - Ordered x-ray of the right foot to assess for fracture or other injury. - Advised buddy taping the toes if a fracture is confirmed. - Prescribed meloxicam  for anti-inflammatory treatment. Orders:   DG Foot Complete Right; Future   meloxicam  (MOBIC ) 15 MG tablet; Take 1 tablet (15 mg total) by mouth daily.  Lumbar back pain Chronic low back pain exacerbated by recent fall. Pain radiates up, especially mainly on the right side. Chiropractic adjustments partially effective. Muscle relaxants effective in past. - Ordered x-ray of the back to assess for any underlying issues. - Prescribed cyclobenzaprine  5 mg three times a day as needed for muscle spasms. - Prescribed meloxicam  for anti-inflammatory treatment. Orders:   DG Lumbar Spine 2-3 Views; Future   meloxicam  (MOBIC )  15 MG tablet; Take 1 tablet (15 mg total) by mouth daily.  Strain of lumbar region, initial encounter Chronic low back pain exacerbated by recent fall. Pain radiates up, especially mainly on the right side. Chiropractic adjustments partially effective. Muscle relaxants effective in past. - Ordered x-ray of the back to assess for any underlying issues. -  Prescribed cyclobenzaprine  5 mg three times a day as needed for muscle spasms. - Prescribed meloxicam  for anti-inflammatory treatment. Orders:   cyclobenzaprine  (FLEXERIL ) 5 MG tablet; Take 1 tablet (5 mg total) by mouth 3 (three) times daily as needed for muscle spasms.   Body mass index is 40.88 kg/m..   Meds ordered this encounter  Medications   cyclobenzaprine  (FLEXERIL ) 5 MG tablet    Sig: Take 1 tablet (5 mg total) by mouth 3 (three) times daily as needed for muscle spasms.    Dispense:  60 tablet    Refill:  1   buPROPion  (WELLBUTRIN  XL) 300 MG 24 hr tablet    Sig: Take 1 tablet (300 mg total) by mouth daily.    Dispense:  90 tablet    Refill:  0   meloxicam  (MOBIC ) 15 MG tablet    Sig: Take 1 tablet (15 mg total) by mouth daily.    Dispense:  30 tablet    Refill:  0    Orders Placed This Encounter  Procedures   DG Foot Complete Right   DG Lumbar Spine 2-3 Views     I,Marla I Leal-Borjas,acting as a scribe for Abigail Free, MD.,have documented all relevant documentation on the behalf of Abigail Free, MD,as directed by  Abigail Free, MD while in the presence of Abigail Free, MD.   Follow-up: No follow-ups on file.  An After Visit Summary was printed and given to the patient.  Abigail Free, MD [facility] [phone]

## 2024-09-10 ENCOUNTER — Other Ambulatory Visit: Payer: Self-pay | Admitting: Physician Assistant

## 2024-09-10 ENCOUNTER — Ambulatory Visit: Admitting: Family Medicine

## 2024-09-10 VITALS — BP 134/70 | HR 93 | Temp 98.0°F | Ht 70.5 in | Wt 289.0 lb

## 2024-09-10 DIAGNOSIS — F332 Major depressive disorder, recurrent severe without psychotic features: Secondary | ICD-10-CM

## 2024-09-10 DIAGNOSIS — R45851 Suicidal ideations: Secondary | ICD-10-CM

## 2024-09-10 DIAGNOSIS — M79671 Pain in right foot: Secondary | ICD-10-CM | POA: Diagnosis not present

## 2024-09-10 DIAGNOSIS — E1142 Type 2 diabetes mellitus with diabetic polyneuropathy: Secondary | ICD-10-CM

## 2024-09-10 DIAGNOSIS — S39012A Strain of muscle, fascia and tendon of lower back, initial encounter: Secondary | ICD-10-CM

## 2024-09-10 DIAGNOSIS — M545 Low back pain, unspecified: Secondary | ICD-10-CM

## 2024-09-10 DIAGNOSIS — F331 Major depressive disorder, recurrent, moderate: Secondary | ICD-10-CM

## 2024-09-10 MED ORDER — BUPROPION HCL ER (XL) 300 MG PO TB24: 300.0000 mg | ORAL_TABLET | Freq: Every day | ORAL | 0 refills | Status: AC

## 2024-09-10 MED ORDER — MELOXICAM 15 MG PO TABS: 15.0000 mg | ORAL_TABLET | Freq: Every day | ORAL | 0 refills | Status: AC

## 2024-09-10 MED ORDER — CYCLOBENZAPRINE HCL 5 MG PO TABS: 5.0000 mg | ORAL_TABLET | Freq: Three times a day (TID) | ORAL | 1 refills | Status: AC | PRN

## 2024-09-10 NOTE — Assessment & Plan Note (Addendum)
 Depression with recent suicidal ideation due to life stressors. On multiple medications. No current suicidal plan but history of suicidal thoughts and past hospitalization. - Increased Wellbutrin  XL to 300 mg in the morning. - Referred to psychiatry for further evaluation and management of depression and suicidal ideation. - Provided information on suicidal crisis lines and after-hours support. - Encouraged continued therapy sessions with her counselor. - Advised to ensure no firearms are accessible at home to prevent any potential harm. Orders:   buPROPion  (WELLBUTRIN  XL) 300 MG 24 hr tablet; Take 1 tablet (300 mg total) by mouth daily.

## 2024-09-10 NOTE — Patient Instructions (Signed)
  VISIT SUMMARY: Today, we addressed your back and foot pain following a fall, as well as your headaches, sinus congestion, and depressive symptoms. We have ordered x-rays and adjusted your medications to help manage your symptoms.  YOUR PLAN: PAIN AND SWELLING IN RIGHT FOOT: You have pain and swelling in your right foot since your fall, with possible fracture or bruising. -We have ordered an x-ray of your right foot to check for any fractures or injuries. -If a fracture is confirmed in your toes, you should buddy tape your toes. -You have been prescribed meloxicam  to help reduce inflammation. - You may pick up a surgical shoe from a pharmacy   LOW BACK PAIN AND LUMBAR STRAIN: You have chronic low back pain that has worsened since your fall. -We have ordered an x-ray of your back to look for any underlying issues. -You have been prescribed cyclobenzaprine  5 mg to take three times a day as needed for muscle spasms. -You have been prescribed meloxicam  to help reduce inflammation.  DEPRESSION WITH RECENT SUICIDAL IDEATION: You are experiencing depression with recent suicidal thoughts due to various life stressors. -Your Wellbutrin  XL dosage has been increased to 300 mg in the morning. -Please go to [facility] between 8-4 pm during the week to establish with a psychiatrist.  - If it is after hours, [facility] has a behavioral unit which you can go to if suicidal thoughts.  - Fill out ADHD AND MOOD DISORDER FORMS TO TAKE TO [facility] AND TO BRING BACK TO [facility].  -You have been provided with information on suicidal crisis lines and after-hours support. -Continue your therapy sessions with your counselor. -Ensure that no firearms are accessible at home to prevent any potential harm.                      Contains text generated by [employer].                                 Contains text generated by [employer].

## 2024-09-10 NOTE — Assessment & Plan Note (Deleted)
 Chronic low back pain exacerbated by recent fall. Pain radiates up, especially mainly on the right side. Chiropractic adjustments partially effective. Muscle relaxants effective in past. - Ordered x-ray of the back to assess for any underlying issues. - Prescribed cyclobenzaprine  5 mg three times a day as needed for muscle spasms. - Prescribed meloxicam  for anti-inflammatory treatment. Orders:   cyclobenzaprine  (FLEXERIL ) 5 MG tablet; Take 1 tablet (5 mg total) by mouth 3 (three) times daily as needed for muscle spasms.

## 2024-09-11 ENCOUNTER — Encounter: Payer: Self-pay | Admitting: Family Medicine

## 2024-09-11 ENCOUNTER — Ambulatory Visit: Payer: Self-pay | Admitting: Family Medicine

## 2024-09-13 DIAGNOSIS — M79671 Pain in right foot: Secondary | ICD-10-CM | POA: Insufficient documentation

## 2024-09-13 DIAGNOSIS — M545 Low back pain, unspecified: Secondary | ICD-10-CM | POA: Insufficient documentation

## 2024-09-13 NOTE — Assessment & Plan Note (Addendum)
 Swelling and discoloration suggest possible fracture or bruising. Neuropathy may obscure pain assessment. - Ordered x-ray of the right foot to assess for fracture or other injury. - Advised buddy taping the toes if a fracture is confirmed. - Prescribed meloxicam  for anti-inflammatory treatment. Orders:   DG Foot Complete Right; Future   meloxicam  (MOBIC ) 15 MG tablet; Take 1 tablet (15 mg total) by mouth daily.

## 2024-09-13 NOTE — Assessment & Plan Note (Addendum)
 Chronic low back pain exacerbated by recent fall. Pain radiates up, especially mainly on the right side. Chiropractic adjustments partially effective. Muscle relaxants effective in past. - Ordered x-ray of the back to assess for any underlying issues. - Prescribed cyclobenzaprine  5 mg three times a day as needed for muscle spasms. - Prescribed meloxicam  for anti-inflammatory treatment. Orders:   DG Lumbar Spine 2-3 Views; Future   meloxicam  (MOBIC ) 15 MG tablet; Take 1 tablet (15 mg total) by mouth daily.

## 2024-09-14 ENCOUNTER — Encounter: Payer: Self-pay | Admitting: Family Medicine

## 2024-09-14 ENCOUNTER — Other Ambulatory Visit: Payer: Self-pay | Admitting: Family Medicine

## 2024-09-14 DIAGNOSIS — R45851 Suicidal ideations: Secondary | ICD-10-CM | POA: Insufficient documentation

## 2024-09-14 MED ORDER — FLUCONAZOLE 150 MG PO TABS: 150.0000 mg | ORAL_TABLET | Freq: Every day | ORAL | 0 refills | Status: DC

## 2024-09-14 NOTE — Assessment & Plan Note (Signed)
 Contracted for safety.  Provided with information on suicidal crisis lines and after-hours support. -Continue your therapy sessions with your counselor.

## 2024-10-13 ENCOUNTER — Ambulatory Visit: Admitting: Physician Assistant

## 2024-10-13 ENCOUNTER — Encounter: Payer: Self-pay | Admitting: Physician Assistant

## 2024-10-13 DIAGNOSIS — E119 Type 2 diabetes mellitus without complications: Secondary | ICD-10-CM

## 2024-10-13 DIAGNOSIS — J101 Influenza due to other identified influenza virus with other respiratory manifestations: Secondary | ICD-10-CM | POA: Diagnosis not present

## 2024-10-13 DIAGNOSIS — J0100 Acute maxillary sinusitis, unspecified: Secondary | ICD-10-CM | POA: Diagnosis not present

## 2024-10-13 LAB — POCT INFLUENZA A/B
Influenza A, POC: POSITIVE — AB
Influenza B, POC: NEGATIVE

## 2024-10-13 LAB — POC COVID19 BINAXNOW: SARS Coronavirus 2 Ag: NEGATIVE

## 2024-10-13 MED ORDER — OSELTAMIVIR PHOSPHATE 75 MG PO CAPS: 75.0000 mg | ORAL_CAPSULE | Freq: Two times a day (BID) | ORAL | 0 refills | Status: DC

## 2024-10-13 MED ORDER — GUAIFENESIN-CODEINE 100-10 MG/5ML PO SOLN: 10.0000 mL | Freq: Four times a day (QID) | ORAL | 0 refills | Status: DC | PRN

## 2024-10-13 MED ORDER — TIRZEPATIDE 7.5 MG/0.5ML ~~LOC~~ SOAJ: 7.5000 mg | SUBCUTANEOUS | 3 refills | Status: AC

## 2024-10-13 NOTE — Assessment & Plan Note (Addendum)
 Continue to monitor symptoms Sent in Tamiflu   Will adjust treatment if symptoms continue Orders:   POCT Influenza A/B   POC COVID-19 BinaxNow

## 2024-10-13 NOTE — Progress Notes (Signed)
 "  Acute Office Visit  Subjective:    Patient ID: Holly [name], female    DOB: 05-10-1966, 58 y.o.   MRN: [id number]  Chief Complaint  Patient presents with   Sinusitis    HPI: Patient is in today for cough and sinus congestion Discussed the use of AI scribe software for clinical note transcription with the patient, who gave verbal consent to proceed.  History of Present Illness Holly [name] is a 58 year old female who presents with flu symptoms.  She has been feeling unwell since the end of last week, with symptoms worsening over the weekend. Her symptoms include severe sinus pain, ear discomfort, and a deep cough that feels 'down in the lungs'. She has experienced low-grade fevers but no high fevers.  She believes she contracted the flu from her workplace, where several colleagues and students were also affected. Her son is experiencing similar symptoms, although he has not had a positive flu test yet. She has received a flu shot, which she believes may have mitigated the severity of her symptoms. Unlike her son and daughter-in-law, who have had fevers up to 103F, she has not experienced high fevers.  Her past medical history includes diabetes, for which she is on medication that causes nausea and occasional diarrhea. Nausea is the more bothersome symptom, but Zofran  has been effective in managing it. Her current medications include a diabetes medication that she is considering adjusting from 10 mg to 7.5 mg due to gastrointestinal side effects. She has previously used codeine -based cough suppressants effectively for severe coughs.      Past Medical History:  Diagnosis Date   Acute non-recurrent maxillary sinusitis 06/17/2020   Acute pain of left shoulder 09/12/2023   Allergic rhinitis 05/26/2019   Anxiety    At risk for obstructive sleep apnea 09/28/2020   BMI 40.0-44.9, adult (HCC) 08/02/2020   Cellulitis of foot, right 09/24/2023   Chronic sinusitis of both maxillary sinuses  12/08/2023   COVID-19 virus detected 12/30/2019   10/30/2019-SARS-CoV-2-positive Didn't require hospitalization     Depression    Diabetes mellitus without complication (HCC)    Diabetic glomerulopathy (HCC)    Diabetic polyneuropathy (HCC) 08/02/2020   Fatty liver    Fracture of intermediate cuneiform bone of right foot 09/16/2023   Generalized abdominal pain 01/09/2023   Goiter    Hashimoto's thyroiditis    Hilar adenopathy 03/20/19  04/11/19   CTA CHEST and PET per DR. VALARIA LEWIS   History of kidney stones    Hospital discharge follow-up 09/24/2023   Hypertension    Hypothyroidism (acquired) 08/02/2020   Insomnia 11/12/2022   Mediastinal adenopathy    PER CTA CHEST PET SCAN per DR. Socorro LEWIS   Mixed hyperlipidemia 08/02/2020   Moderate recurrent major depression (HCC) 07/09/2017   Morbid obesity (HCC) 08/02/2020   OSA (obstructive sleep apnea) 11/23/2020   Pericarditis 01/30/2024   Pharyngoesophageal dysphagia 01/09/2023   Pneumonia    Sarcoidosis 03/01/2019   PER PET SCAN per DR. Villa LEWIS     Severe recurrent major depression without psychotic features (HCC)    Strain of lumbar region 09/10/2022   Supraclavicular adenopathy 03/20/2019   PER CTA CHEST and PET per DR. ELEANORE LEWIS   Type 2 diabetes mellitus with hyperglycemia, without long-term current use of insulin (HCC) 08/10/2023   Urticaria due to drug allergy 11/26/2023   Vaginal yeast infection 07/31/2023   Vitamin D  deficiency 12/09/2022   Wound of left foot 01/30/2024  Past Surgical History:  Procedure Laterality Date   ABDOMINAL HYSTERECTOMY     COMPLETE   ADHESIOLYSIS     APPENDECTOMY     CESAREAN SECTION     MEDIASTINOSCOPY N/A 04/18/2019   Procedure: MEDIASTINOSCOPY;  Surgeon: Army Dallas NOVAK, MD;  Location: [facility] OR;  Service: Thoracic;  Laterality: N/A;   VIDEO BRONCHOSCOPY WITH ENDOBRONCHIAL ULTRASOUND N/A 04/18/2019   Procedure: VIDEO BRONCHOSCOPY WITH ENDOBRONCHIAL ULTRASOUND;   Surgeon: Army Dallas NOVAK, MD;  Location: [facility] OR;  Service: Thoracic;  Laterality: N/A;    Family History  Problem Relation Age of Onset   Breast cancer Mother    Anuerysm Mother        BRAIN   Cancer Mother        BREAST   COPD Father    Hyperlipidemia Father    Congestive Heart Failure Father    Bipolar disorder Sister    Diabetes Sister    Fibromyalgia Sister    Cancer Paternal Uncle        LUNG   Breast cancer Maternal Grandmother    Cancer Maternal Grandmother 7       BREAST   Cancer Paternal Grandfather        LUNG    Social History   Socioeconomic History   Marital status: Divorced    Spouse name: Not on file   Number of children: Not on file   Years of education: Not on file   Highest education level: Master's degree (e.g., MA, MS, MEng, MEd, MSW, MBA)  Occupational History   Not on file  Tobacco Use   Smoking status: Never   Smokeless tobacco: Never  Vaping Use   Vaping status: Never Used  Substance and Sexual Activity   Alcohol use: Yes    Comment: VERY RARE OCCASION   Drug use: Never   Sexual activity: Not Currently  Other Topics Concern   Not on file  Social History Narrative   Not on file   Social Drivers of Health   Tobacco Use: Low Risk (09/14/2024)   Patient History    Smoking Tobacco Use: Never    Smokeless Tobacco Use: Never    Passive Exposure: Not on file  Financial Resource Strain: Medium Risk (09/30/2023)   Overall Financial Resource Strain (CARDIA)    Difficulty of Paying Living Expenses: Somewhat hard  Food Insecurity: No Food Insecurity (09/10/2024)   Epic    Worried About Programme Researcher, Broadcasting/film/video in the Last Year: Never true    Ran Out of Food in the Last Year: Never true  Transportation Needs: No Transportation Needs (09/10/2024)   Epic    Lack of Transportation (Medical): No    Lack of Transportation (Non-Medical): No  Physical Activity: Insufficiently Active (09/30/2023)   Exercise Vital Sign    Days of Exercise per Week: 2  days    Minutes of Exercise per Session: 20 min  Stress: Stress Concern Present (09/30/2023)   [employer] of Occupational Health - Occupational Stress Questionnaire    Feeling of Stress : To some extent  Social Connections: Moderately Integrated (09/30/2023)   Social Connection and Isolation Panel    Frequency of Communication with Friends and Family: More than three times a week    Frequency of Social Gatherings with Friends and Family: Once a week    Attends Religious Services: More than 4 times per year    Active Member of [employer] or Organizations: Yes    Attends Banker Meetings: 1 to  4 times per year    Marital Status: Divorced  Intimate Partner Violence: Not At Risk (09/10/2024)   Epic    Fear of Current or Ex-Partner: No    Emotionally Abused: No    Physically Abused: No    Sexually Abused: No  Depression (PHQ2-9): High Risk (09/10/2024)   Depression (PHQ2-9)    PHQ-2 Score: 19  Alcohol Screen: Low Risk (09/10/2024)   Alcohol Screen    Last Alcohol Screening Score (AUDIT): 0  Housing: Low Risk (09/10/2024)   Epic    Unable to Pay for Housing in the Last Year: No    Number of Times Moved in the Last Year: 0    Homeless in the Last Year: No  Utilities: Not At Risk (09/10/2024)   Epic    Threatened with loss of utilities: No  Health Literacy: Adequate Health Literacy (09/10/2024)   B1300 Health Literacy    Frequency of need for help with medical instructions: Never    Outpatient Medications Prior to Visit  Medication Sig Dispense Refill   aspirin  EC 81 MG tablet Take 1 tablet (81 mg total) by mouth daily. Swallow whole. 90 tablet 3   budesonide -formoterol  (SYMBICORT ) 160-4.5 MCG/ACT inhaler Inhale 2 puffs into the lungs 2 (two) times daily. 1 each 3   buPROPion  (WELLBUTRIN  XL) 300 MG 24 hr tablet Take 1 tablet (300 mg total) by mouth daily. 90 tablet 0   busPIRone  (BUSPAR ) 7.5 MG tablet TAKE 1 TABLET BY MOUTH TWICE A DAY 180 tablet 1   cyclobenzaprine   (FLEXERIL ) 5 MG tablet Take 1 tablet (5 mg total) by mouth 3 (three) times daily as needed for muscle spasms. 60 tablet 1   diltiazem  (CARDIZEM  CD) 120 MG 24 hr capsule TAKE 1 CAPSULE BY MOUTH EVERY DAY 90 capsule 1   doxycycline  (VIBRA -TABS) 100 MG tablet Take 1 tablet (100 mg total) by mouth 2 (two) times daily. 14 tablet 0   famotidine  (PEPCID ) 40 MG tablet TAKE 1 TABLET BY MOUTH TWICE A DAY 180 tablet 0   FARXIGA  5 MG TABS tablet TAKE 1 TABLET BY MOUTH EVERY DAY BEFORE BREAKFAST 90 tablet 1   fluconazole  (DIFLUCAN ) 150 MG tablet Take 1 tablet (150 mg total) by mouth daily at 6 (six) AM. 2 tablet 0   furosemide  (LASIX ) 40 MG tablet TAKE 1 TABLET BY MOUTH EVERY DAY 84 tablet 1   gabapentin  (NEURONTIN ) 300 MG capsule TAKE 2 CAPSULES BY MOUTH TWICE A DAY 360 capsule 0   levothyroxine  (SYNTHROID ) 25 MCG tablet Take 1 tablet (25 mcg total) by mouth daily before breakfast. 90 tablet 0   meloxicam  (MOBIC ) 15 MG tablet Take 1 tablet (15 mg total) by mouth daily. 30 tablet 0   metFORMIN  (GLUCOPHAGE ) 1000 MG tablet TAKE 1 TABLET (1,000 MG TOTAL) BY MOUTH TWICE A DAY WITH FOOD 180 tablet 2   mometasone  (NASONEX ) 50 MCG/ACT nasal spray Place 2 sprays into the nose daily. 1 each 3   Multiple Vitamin (MULTI-VITAMIN) tablet Take 1 tablet by mouth daily.     NEXLIZET  180-10 MG TABS TAKE 1 TABLET BY MOUTH EVERY DAY 30 tablet 1   omeprazole (PRILOSEC) 40 MG capsule TAKE 1 (ONE) CAPSULE BY MOUTH EVERY MORNING 30 MINUTES BEFORE BREAKFAST 90 capsule 1   rosuvastatin  (CRESTOR ) 10 MG tablet Take 1 tablet (10 mg total) by mouth daily. 90 tablet 3   SODIUM FLUORIDE 5000 PPM 1.1 % PSTE See admin instructions.     tamsulosin  (FLOMAX ) 0.4 MG CAPS capsule  Take 1 capsule (0.4 mg total) by mouth daily. 30 capsule 3   traZODone  (DESYREL ) 150 MG tablet TAKE 1 TABLET BY MOUTH EVERYDAY AT BEDTIME 90 tablet 0   venlafaxine XR (EFFEXOR-XR) 75 MG 24 hr capsule TAKE 3 CAPSULES BY MOUTH DAILY IN THE MORNING 270 capsule 1   Vitamin  D, Ergocalciferol , (DRISDOL ) 1.25 MG (50000 UNIT) CAPS capsule TAKE 1 CAPSULE (50,000 UNITS TOTAL) BY MOUTH EVERY 7 (SEVEN) DAYS 5 capsule 2   tirzepatide  (MOUNJARO ) 10 MG/0.5ML Pen Inject 10 mg into the skin once a week. 6 mL 2   No facility-administered medications prior to visit.    Allergies[1]  Review of Systems  Constitutional:  Negative for appetite change, fatigue and fever.  HENT:  Positive for congestion, sinus pressure and sinus pain. Negative for ear pain and sore throat.   Respiratory:  Positive for cough. Negative for chest tightness, shortness of breath and wheezing.   Cardiovascular:  Negative for chest pain and palpitations.  Gastrointestinal:  Negative for abdominal pain, constipation, diarrhea, nausea and vomiting.  Genitourinary:  Negative for dysuria and hematuria.  Musculoskeletal:  Negative for arthralgias, back pain, joint swelling and myalgias.  Skin:  Negative for rash.  Neurological:  Negative for dizziness, weakness and headaches.  Psychiatric/Behavioral:  Negative for dysphoric mood. The patient is not nervous/anxious.        Objective:        09/10/2024    4:06 PM 08/14/2024    3:43 PM 05/20/2024    2:11 PM  Vitals with BMI  Height 5' 10.5 5' 10.5   Weight 289 lbs 285 lbs   BMI 40.87 40.3   Systolic 134 108 847  Diastolic 70 64 84  Pulse 93 91     No data found.   Physical Exam  Health Maintenance Due  Topic Date Due   OPHTHALMOLOGY EXAM  Never done   HIV Screening  Never done   Hepatitis C Screening  Never done   Pneumococcal Vaccine: 50+ Years (2 of 2 - PCV) 08/02/2021   Zoster Vaccines- Shingrix (2 of 2) 08/05/2021   COVID-19 Vaccine (4 - 2025-26 season) 06/23/2024    There are no preventive care reminders to display for this patient.   Lab Results  Component Value Date   TSH 3.820 05/20/2024   Lab Results  Component Value Date   WBC 10.8 05/20/2024   HGB 15.6 05/20/2024   HCT 48.5 (H) 05/20/2024   MCV 95 05/20/2024    PLT 331 05/20/2024   Lab Results  Component Value Date   NA 139 05/20/2024   K 5.1 05/20/2024   CO2 24 05/20/2024   GLUCOSE 87 05/20/2024   BUN 14 05/20/2024   CREATININE 0.73 05/20/2024   BILITOT 0.2 05/20/2024   ALKPHOS 101 05/20/2024   AST 34 05/20/2024   ALT 37 (H) 05/20/2024   PROT 7.6 05/20/2024   ALBUMIN 4.4 05/20/2024   CALCIUM  10.5 (H) 05/20/2024   ANIONGAP 11 04/16/2019   EGFR 96 05/20/2024   GFR 71.87 12/31/2019   Lab Results  Component Value Date   CHOL 157 05/20/2024   Lab Results  Component Value Date   HDL 58 05/20/2024   Lab Results  Component Value Date   LDLCALC 73 05/20/2024   Lab Results  Component Value Date   TRIG 155 (H) 05/20/2024   Lab Results  Component Value Date   CHOLHDL 2.7 05/20/2024   Lab Results  Component Value Date   HGBA1C 6.2 (H)  05/20/2024        Results for orders placed or performed in visit on 10/13/24  POCT Influenza A/B   Collection Time: 10/13/24  3:42 PM  Result Value Ref Range   Influenza A, POC Positive (A) Negative   Influenza B, POC Negative Negative  POC COVID-19 BinaxNow   Collection Time: 10/13/24  3:42 PM  Result Value Ref Range   SARS Coronavirus 2 Ag Negative Negative     Assessment & Plan:   Assessment & Plan Acute non-recurrent maxillary sinusitis Continue to monitor symptoms Sent in Tamiflu   Will adjust treatment if symptoms continue Orders:   POCT Influenza A/B   POC COVID-19 BinaxNow  Influenza A Influenza A Acute Influenza A infection with significant sinus pain and cough. Contagious while symptomatic. Tamiflu  indicated within treatment window. Discussed flu type A vs. B and importance of early treatment. - Prescribed Tamiflu , higher dose for shorter duration, twice a day for five days. - Prescribed codeine -based cough suppressant. - Advised use of cold humidifier at night. - Instructed to wash hands frequently. Orders:   oseltamivir  (TAMIFLU ) 75 MG capsule; Take 1 capsule (75  mg total) by mouth 2 (two) times daily.   guaiFENesin -codeine  100-10 MG/5ML syrup; Take 10 mLs by mouth every 6 (six) hours as needed for cough.  Diabetes mellitus without complication (HCC) Type 2 diabetes mellitus With current medication. Considering reducing Mounjaro  due to gastrointestinal side effects. Zofran  effective for nausea. Discussed potential medication reduction if cholesterol remains low. - Reduced Mounjaro  dose to 7.5 mg. - Advised to contact if nausea becomes unbearable or lasts more than a day after injection.       There is no height or weight on file to calculate BMI..    Meds ordered this encounter  Medications   oseltamivir  (TAMIFLU ) 75 MG capsule    Sig: Take 1 capsule (75 mg total) by mouth 2 (two) times daily.    Dispense:  10 capsule    Refill:  0   guaiFENesin -codeine  100-10 MG/5ML syrup    Sig: Take 10 mLs by mouth every 6 (six) hours as needed for cough.    Dispense:  118 mL    Refill:  0   tirzepatide  (MOUNJARO ) 7.5 MG/0.5ML Pen    Sig: Inject 7.5 mg into the skin once a week.    Dispense:  6 mL    Refill:  3    Orders Placed This Encounter  Procedures   POCT Influenza A/B   POC COVID-19 BinaxNow     Follow-up: No follow-ups on file.  An After Visit Summary was printed and given to the patient.   I,Lauren M Auman,acting as a neurosurgeon for [employer], [employer].,have documented all relevant documentation on the behalf of Nola Angles, PA,as directed by  Nola Angles, PA while in the presence of Nola Angles, [place].    Nola Angles, [place] [facility] [phone]     [1]  Allergies Allergen Reactions   Irbesartan  Hives   Moxifloxacin Anaphylaxis, Other (See Comments) and Hives   Avelox [Moxifloxacin Hcl In Nacl]    Cymbalta [Duloxetine Hcl]    Duloxetine Hives   Oxycodone-Acetaminophen Hives   "

## 2024-10-13 NOTE — Assessment & Plan Note (Signed)
 Type 2 diabetes mellitus With current medication. Considering reducing Mounjaro  due to gastrointestinal side effects. Zofran  effective for nausea. Discussed potential medication reduction if cholesterol remains low. - Reduced Mounjaro  dose to 7.5 mg. - Advised to contact if nausea becomes unbearable or lasts more than a day after injection.

## 2024-10-14 ENCOUNTER — Ambulatory Visit: Admitting: Physician Assistant

## 2024-10-28 ENCOUNTER — Telehealth: Payer: Self-pay

## 2024-10-28 NOTE — Telephone Encounter (Signed)
 Called patient and she stated that she is still having very bad sinus pressure and she was seen in urgent care on last wednesday and is on her last day of Augmentin  and prednisone  taper. She stated that she was starting to feel better but now her sinus pressure is worst and she is having ear pain. Recommend patient come in to be seen by a provider. Appointment made for 3:40 PM  Copied from CRM [id number]. Topic: Clinical - Medication Question >> Oct 28, 2024  2:27 PM Delon T wrote: Reason for CRM: Today is the last day of antibiotic but not feeling better, sinuses hurt very bad- asking if more antibiotics- [phone]

## 2024-10-29 ENCOUNTER — Ambulatory Visit: Admitting: Physician Assistant

## 2024-10-29 ENCOUNTER — Encounter: Payer: Self-pay | Admitting: Physician Assistant

## 2024-10-29 VITALS — BP 138/78 | HR 84 | Temp 97.8°F | Ht 70.5 in | Wt 290.0 lb

## 2024-10-29 DIAGNOSIS — J342 Deviated nasal septum: Secondary | ICD-10-CM | POA: Diagnosis not present

## 2024-10-29 DIAGNOSIS — J320 Chronic maxillary sinusitis: Secondary | ICD-10-CM | POA: Diagnosis not present

## 2024-10-29 MED ORDER — FLUCONAZOLE 150 MG PO TABS: 150.0000 mg | ORAL_TABLET | Freq: Every day | ORAL | 0 refills | Status: AC

## 2024-10-29 MED ORDER — CLINDAMYCIN HCL 300 MG PO CAPS: 300.0000 mg | ORAL_CAPSULE | Freq: Three times a day (TID) | ORAL | 0 refills | Status: AC

## 2024-10-29 NOTE — Assessment & Plan Note (Signed)
 Chronic maxillary sinusitis Recurrent episodes with severe frontal pain and left eardrum bulging. Differential includes sinus drainage obstruction due to septal deviation and swelling. Discussed extended antibiotic therapy and ENT referral. Explained risk of Clostridium difficile infection with prolonged antibiotics and recommended probiotics. - Prescribed clindamycin  for two weeks. - Recommended ENT referral for further evaluation and possible nasal septal repair. - Advised consumption of Activia yogurt or probiotics during antibiotic course to prevent Clostridium difficile infection. - Prescribed Diflucan  post-antibiotic course to prevent yeast infection. Orders:   clindamycin  (CLEOCIN ) 300 MG capsule; Take 1 capsule (300 mg total) by mouth 3 (three) times daily.

## 2024-10-29 NOTE — Assessment & Plan Note (Signed)
 Nasal septal deviation Contributes to chronic sinusitis by obstructing normal sinus drainage. ENT suggested potential surgical intervention if symptoms persist. - Recommended ENT referral for evaluation of nasal septal deviation and potential surgical intervention.
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
## 2024-11-02 ENCOUNTER — Encounter: Payer: Self-pay | Admitting: Physician Assistant

## 2024-11-03 ENCOUNTER — Other Ambulatory Visit: Payer: Self-pay | Admitting: Physician Assistant

## 2024-11-03 MED ORDER — DOXYCYCLINE HYCLATE 100 MG PO TABS: 100.0000 mg | ORAL_TABLET | Freq: Two times a day (BID) | ORAL | 0 refills | Status: AC

## 2024-11-04 ENCOUNTER — Other Ambulatory Visit: Payer: Self-pay

## 2024-11-04 MED ORDER — PREDNISONE 10 MG PO TABS: ORAL_TABLET | ORAL | 0 refills | Status: AC

## 2024-11-04 NOTE — Progress Notes (Signed)
 Prescription of prednisone  was sent to [facility] at [facility]. I sent message to patient.

## 2024-11-09 ENCOUNTER — Other Ambulatory Visit: Payer: Self-pay | Admitting: Physician Assistant

## 2024-11-09 DIAGNOSIS — J301 Allergic rhinitis due to pollen: Secondary | ICD-10-CM

## 2024-11-28 ENCOUNTER — Other Ambulatory Visit: Payer: Self-pay | Admitting: Physician Assistant

## 2025-01-20 ENCOUNTER — Ambulatory Visit: Admitting: Physician Assistant
